# Patient Record
Sex: Male | Born: 1950 | Race: Black or African American | Hispanic: No
Health system: Southern US, Academic
[De-identification: ages and names within clinical notes are randomized; demographics above are authoritative.]

## PROBLEM LIST (undated history)

## (undated) ENCOUNTER — Ambulatory Visit

## (undated) ENCOUNTER — Encounter: Attending: Medical Oncology | Primary: Medical Oncology

## (undated) ENCOUNTER — Ambulatory Visit: Payer: Medicare (Managed Care) | Attending: Medical | Primary: Medical

## (undated) ENCOUNTER — Telehealth

## (undated) ENCOUNTER — Encounter

## (undated) ENCOUNTER — Telehealth: Attending: Medical Oncology | Primary: Medical Oncology

## (undated) ENCOUNTER — Encounter: Attending: Nurse Practitioner | Primary: Nurse Practitioner

## (undated) ENCOUNTER — Encounter
Attending: Pharmacist Clinician (PhC)/ Clinical Pharmacy Specialist | Primary: Pharmacist Clinician (PhC)/ Clinical Pharmacy Specialist

## (undated) ENCOUNTER — Ambulatory Visit: Payer: MEDICARE | Attending: Medical Oncology | Primary: Medical Oncology

## (undated) ENCOUNTER — Ambulatory Visit: Payer: MEDICARE

## (undated) ENCOUNTER — Ambulatory Visit
Payer: MEDICARE | Attending: Rehabilitative and Restorative Service Providers" | Primary: Rehabilitative and Restorative Service Providers"

## (undated) ENCOUNTER — Ambulatory Visit: Payer: Medicare (Managed Care)

## (undated) ENCOUNTER — Telehealth
Attending: Pharmacist Clinician (PhC)/ Clinical Pharmacy Specialist | Primary: Pharmacist Clinician (PhC)/ Clinical Pharmacy Specialist

## (undated) ENCOUNTER — Encounter: Payer: MEDICARE | Attending: Medical Oncology | Primary: Medical Oncology

## (undated) ENCOUNTER — Encounter
Payer: MEDICARE | Attending: Rehabilitative and Restorative Service Providers" | Primary: Rehabilitative and Restorative Service Providers"

## (undated) ENCOUNTER — Ambulatory Visit: Attending: Internal Medicine | Primary: Internal Medicine

## (undated) ENCOUNTER — Encounter: Payer: MEDICARE | Attending: Urology | Primary: Urology

## (undated) ENCOUNTER — Ambulatory Visit: Attending: Anesthesiology | Primary: Anesthesiology

## (undated) ENCOUNTER — Encounter: Payer: MEDICARE | Attending: Nurse Practitioner | Primary: Nurse Practitioner

## (undated) ENCOUNTER — Encounter: Attending: Internal Medicine | Primary: Internal Medicine

## (undated) ENCOUNTER — Telehealth: Attending: Urology | Primary: Urology

## (undated) ENCOUNTER — Encounter: Attending: Urology | Primary: Urology

## (undated) ENCOUNTER — Encounter
Payer: MEDICARE | Attending: Pharmacist Clinician (PhC)/ Clinical Pharmacy Specialist | Primary: Pharmacist Clinician (PhC)/ Clinical Pharmacy Specialist

## (undated) ENCOUNTER — Other Ambulatory Visit

## (undated) ENCOUNTER — Ambulatory Visit: Payer: MEDICARE | Attending: Vascular Surgery | Primary: Vascular Surgery

## (undated) ENCOUNTER — Encounter: Attending: Naturopath | Primary: Naturopath

## (undated) ENCOUNTER — Ambulatory Visit: Attending: Pharmacist | Primary: Pharmacist

## (undated) ENCOUNTER — Encounter: Payer: MEDICARE | Attending: Geriatric Medicine | Primary: Geriatric Medicine

## (undated) ENCOUNTER — Ambulatory Visit: Payer: MEDICARE | Attending: Nurse Practitioner | Primary: Nurse Practitioner

## (undated) ENCOUNTER — Non-Acute Institutional Stay: Payer: MEDICARE | Attending: Geriatric Medicine | Primary: Geriatric Medicine

## (undated) DIAGNOSIS — F1011 Alcohol abuse, in remission: Secondary | ICD-10-CM

## (undated) DIAGNOSIS — M549 Dorsalgia, unspecified: Secondary | ICD-10-CM

## (undated) DIAGNOSIS — G8929 Other chronic pain: Secondary | ICD-10-CM

## (undated) DIAGNOSIS — C801 Malignant (primary) neoplasm, unspecified: Secondary | ICD-10-CM

## (undated) DIAGNOSIS — M25579 Pain in unspecified ankle and joints of unspecified foot: Secondary | ICD-10-CM

## (undated) DIAGNOSIS — M25512 Pain in left shoulder: Secondary | ICD-10-CM

## (undated) DIAGNOSIS — I10 Essential (primary) hypertension: Secondary | ICD-10-CM

## (undated) DIAGNOSIS — M1990 Unspecified osteoarthritis, unspecified site: Secondary | ICD-10-CM

## (undated) DIAGNOSIS — E7800 Pure hypercholesterolemia, unspecified: Secondary | ICD-10-CM

## (undated) DIAGNOSIS — M5416 Radiculopathy, lumbar region: Secondary | ICD-10-CM

## (undated) DIAGNOSIS — R52 Pain, unspecified: Secondary | ICD-10-CM

## (undated) DIAGNOSIS — K219 Gastro-esophageal reflux disease without esophagitis: Secondary | ICD-10-CM

## (undated) DIAGNOSIS — J189 Pneumonia, unspecified organism: Secondary | ICD-10-CM

## (undated) HISTORY — DX: Malignant (primary) neoplasm, unspecified: C80.1

## (undated) HISTORY — DX: Gastro-esophageal reflux disease without esophagitis: K21.9

## (undated) HISTORY — PX: TONSILLECTOMY: SUR1361

## (undated) HISTORY — PX: BACK SURGERY: SHX140

## (undated) MED ORDER — XTANDI 40 MG CAPSULE: 160 mg | capsule | Freq: Every day | 6 refills | 30 days

---
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
## 2006-07-31 ENCOUNTER — Emergency Department: Payer: Self-pay | Admitting: Emergency Medicine

## 2008-09-11 ENCOUNTER — Ambulatory Visit (HOSPITAL_COMMUNITY): Admission: RE | Admit: 2008-09-11 | Discharge: 2008-09-11 | Payer: Self-pay | Admitting: Family Medicine

## 2009-11-20 ENCOUNTER — Emergency Department (HOSPITAL_COMMUNITY): Admission: EM | Admit: 2009-11-20 | Discharge: 2009-11-20 | Payer: Self-pay | Admitting: Emergency Medicine

## 2010-05-23 ENCOUNTER — Emergency Department (HOSPITAL_COMMUNITY): Payer: Medicare Other

## 2010-05-23 ENCOUNTER — Emergency Department (HOSPITAL_COMMUNITY)
Admission: EM | Admit: 2010-05-23 | Discharge: 2010-05-23 | Disposition: A | Discharge status: Home / Self Care | Payer: Medicare Other | Attending: Emergency Medicine | Admitting: Emergency Medicine

## 2010-05-23 DIAGNOSIS — R059 Cough, unspecified: Secondary | ICD-10-CM | POA: Insufficient documentation

## 2010-05-23 DIAGNOSIS — I10 Essential (primary) hypertension: Secondary | ICD-10-CM | POA: Insufficient documentation

## 2010-05-23 DIAGNOSIS — R05 Cough: Secondary | ICD-10-CM | POA: Insufficient documentation

## 2010-05-23 DIAGNOSIS — Z79899 Other long term (current) drug therapy: Secondary | ICD-10-CM | POA: Insufficient documentation

## 2010-05-23 DIAGNOSIS — J40 Bronchitis, not specified as acute or chronic: Secondary | ICD-10-CM | POA: Insufficient documentation

## 2010-06-17 LAB — DIFFERENTIAL
Basophils Absolute: 0.1 10*3/uL (ref 0.0–0.1)
Eosinophils Absolute: 0.7 10*3/uL (ref 0.0–0.7)
Lymphs Abs: 1.9 10*3/uL (ref 0.7–4.0)
Neutrophils Relative %: 58 % (ref 43–77)

## 2010-06-17 LAB — COMPREHENSIVE METABOLIC PANEL
ALT: 28 U/L (ref 0–53)
CO2: 28 mEq/L (ref 19–32)
Calcium: 9.6 mg/dL (ref 8.4–10.5)
Creatinine, Ser: 1.1 mg/dL (ref 0.4–1.5)
GFR calc non Af Amer: >60 mL/min (ref >60)
Glucose, Bld: 98 mg/dL (ref 70–99)
Total Bilirubin: 0.4 mg/dL (ref 0.3–1.2)

## 2010-06-17 LAB — CBC
HCT: 41.4 % (ref 39.0–52.0)
Hemoglobin: 14.2 g/dL (ref 13.0–17.0)
MCH: 31.2 pg (ref 26.0–34.0)
MCHC: 34.3 g/dL (ref 30.0–36.0)

## 2010-10-22 ENCOUNTER — Emergency Department (HOSPITAL_COMMUNITY)
Admission: EM | Admit: 2010-10-22 | Discharge: 2010-10-22 | Disposition: A | Discharge status: Home / Self Care | Payer: Medicare Other | Attending: Emergency Medicine | Admitting: Emergency Medicine

## 2010-10-22 ENCOUNTER — Encounter: Payer: Self-pay | Admitting: *Deleted

## 2010-10-22 DIAGNOSIS — F17200 Nicotine dependence, unspecified, uncomplicated: Secondary | ICD-10-CM | POA: Insufficient documentation

## 2010-10-22 DIAGNOSIS — R21 Rash and other nonspecific skin eruption: Secondary | ICD-10-CM | POA: Insufficient documentation

## 2010-10-22 MED ORDER — HYDROXYZINE PAMOATE 25 MG PO CAPS: ORAL_CAPSULE | ORAL | Status: DC

## 2010-10-22 MED ORDER — FAMOTIDINE 20 MG PO TABS
20.0000 mg | ORAL_TABLET | Freq: Once | ORAL | Status: AC
  Administered 2010-10-22: 20 mg via ORAL
  Filled 2010-10-22: qty 1

## 2010-10-22 MED ORDER — HYDROXYZINE HCL 50 MG/ML IM SOLN
25.0000 mg | Freq: Once | INTRAMUSCULAR | Status: AC
  Administered 2010-10-22: 25 mg via INTRAMUSCULAR
  Filled 2010-10-22: qty 1

## 2010-10-22 MED ORDER — PREDNISONE 10 MG PO TABS: ORAL_TABLET | ORAL | Status: DC

## 2010-10-22 MED ORDER — METHYLPREDNISOLONE SODIUM SUCC 125 MG IJ SOLR
125.0000 mg | Freq: Once | INTRAMUSCULAR | Status: AC
  Administered 2010-10-22: 125 mg via INTRAVENOUS
  Filled 2010-10-22: qty 2

## 2010-10-22 NOTE — ED Notes (Signed)
MD at bedside. Patient complaining about wait time for EDPA

## 2010-10-22 NOTE — ED Provider Notes (Signed)
History     Chief Complaint  Patient presents with  . Rash   Patient is a 60 y.o. male presenting with rash. The history is provided by the patient.  Rash  This is a recurrent problem. The current episode started more than 1 week ago. The problem has been gradually worsening. The problem is associated with nothing. There has been no fever. The rash is present on the torso, back, trunk, left upper leg, right upper leg and right buttock. The patient is experiencing no pain. Associated symptoms include itching. Pertinent negatives include no weeping. He has tried nothing for the symptoms. The treatment provided no relief.    History reviewed. No pertinent past medical history.  History reviewed. No pertinent past surgical history.  No family history on file.  History  Substance Use Topics  . Smoking status: Current Everyday Smoker  . Smokeless tobacco: Not on file  . Alcohol Use: Yes     Occ      Review of Systems  Constitutional: Negative for activity change.       All ROS Neg except as noted in HPI  HENT: Negative for nosebleeds and neck pain.   Eyes: Negative for photophobia and discharge.  Respiratory: Negative for cough, shortness of breath and wheezing.   Cardiovascular: Negative for chest pain and palpitations.  Gastrointestinal: Negative for abdominal pain and blood in stool.  Genitourinary: Negative for dysuria, frequency and hematuria.  Musculoskeletal: Negative for back pain and arthralgias.  Skin: Positive for itching and rash.  Neurological: Negative for dizziness, seizures and speech difficulty.  Psychiatric/Behavioral: Negative for hallucinations and confusion.    Physical Exam  BP 149/89  Pulse 64  Temp(Src) 97.9 F (36.6 C) (Oral)  Resp 16  SpO2 100%  Physical Exam  Nursing note and vitals reviewed. Constitutional: He is oriented to person, place, and time. He appears well-developed and well-nourished.  Non-toxic appearance.  HENT:  Head:  Normocephalic.  Right Ear: Tympanic membrane and external ear normal.  Left Ear: Tympanic membrane and external ear normal.  Eyes: EOM and lids are normal. Pupils are equal, round, and reactive to light.  Neck: Normal range of motion. Neck supple. Carotid bruit is not present.  Cardiovascular: Normal rate, regular rhythm, normal heart sounds, intact distal pulses and normal pulses.   Pulmonary/Chest: Breath sounds normal. No respiratory distress.  Abdominal: Soft. Bowel sounds are normal. There is no tenderness. There is no guarding.  Musculoskeletal: Normal range of motion.  Lymphadenopathy:       Head (right side): No submandibular adenopathy present.       Head (left side): No submandibular adenopathy present.    He has no cervical adenopathy.  Neurological: He is alert and oriented to person, place, and time. He has normal strength. No cranial nerve deficit or sensory deficit.  Skin: Skin is warm and dry. Rash noted.  Psychiatric: He has a normal mood and affect. His speech is normal.    ED Course  Procedures  MDM I have reviewed nursing notes, vital signs, and all appropriate lab and imaging results for this patient.      Kathie Dike, [place] 10/22/10 [phone]

## 2010-10-22 NOTE — ED Notes (Signed)
Pt states rash x 2 weeks. NAD.

## 2010-10-23 NOTE — ED Provider Notes (Signed)
Medical screening examination/treatment/procedure(s) were performed by non-physician practitioner and as supervising physician I was immediately available for consultation/collaboration.   Lyanne Co, MD 10/23/10 [phone]

## 2014-02-27 ENCOUNTER — Encounter (HOSPITAL_COMMUNITY): Payer: Self-pay | Admitting: Emergency Medicine

## 2014-02-27 ENCOUNTER — Emergency Department (HOSPITAL_COMMUNITY)
Admission: EM | Admit: 2014-02-27 | Discharge: 2014-02-27 | Disposition: A | Discharge status: Home / Self Care | Payer: Medicare Other | Attending: Emergency Medicine | Admitting: Emergency Medicine

## 2014-02-27 DIAGNOSIS — Z79899 Other long term (current) drug therapy: Secondary | ICD-10-CM | POA: Insufficient documentation

## 2014-02-27 DIAGNOSIS — Z720 Tobacco use: Secondary | ICD-10-CM | POA: Diagnosis not present

## 2014-02-27 DIAGNOSIS — Z7952 Long term (current) use of systemic steroids: Secondary | ICD-10-CM | POA: Insufficient documentation

## 2014-02-27 DIAGNOSIS — G8929 Other chronic pain: Secondary | ICD-10-CM | POA: Diagnosis not present

## 2014-02-27 DIAGNOSIS — M545 Low back pain, unspecified: Secondary | ICD-10-CM

## 2014-02-27 MED ORDER — OXYCODONE HCL 5 MG PO TABS: 5.0000 mg | ORAL_TABLET | ORAL | Status: DC | PRN

## 2014-02-27 NOTE — ED Notes (Signed)
Pt reports chronic back pain. Pt denies any new or recent injury.

## 2014-02-27 NOTE — ED Provider Notes (Signed)
CSN: [id number]     Arrival date & time 02/27/14  1142 History   This chart was scribed for [facility], NP with Babette Relic, MD by Edison Simon, ED Scribe. This patient was seen in room APFT21/APFT21 and the patient's care was started at 12:42 PM.    Chief Complaint  Patient presents with  . Back Pain   Patient is a 63 y.o. male presenting with back pain. The history is provided by the patient. No language interpreter was used.  Back Pain Location:  Lumbar spine Quality:  Unable to specify Radiates to:  L knee and R knee (buttocks) Pain severity:  Moderate Onset quality:  Gradual Duration: chronic, worse since last night. Timing:  Constant Progression:  Worsening Chronicity:  Chronic Context: not recent injury   Relieved by: BC. Ineffective treatments:  None tried Associated symptoms: no dysuria and no fever   Risk factors: hx of cancer     HPI Comments: Willie Fowler is a 63 y.o. male who presents to the Emergency Department complaining of chronic low back pain, worsening last night while he was sitting down. He is not able to verbalize the quality of his pain. He states that it radiates to bilateral buttocks and knees. He reports associated shoulder pain. He states that he has been treated for pain in the pastby a doctor at [facility]. He states the "lower bone in my back is gone," but denies injury or surgery. He is not aware of any initial injury that caused his pain. He states he has used BC with slight improvement. He states he has been prescribed Oxycodone in the past for back pain flare ups. He reports history of prostate cancer for the past 5 years; he states he received radiation therapy, but his cancer is recurring and states he has frequency and nocturia. He states he receives an injection in his hip every 3 months, but does not know it is. He states he next returns to [facility] on 12/29. He denies fever, chills, or dysuria.   History reviewed. No pertinent past medical  history. History reviewed. No pertinent past surgical history. History reviewed. No pertinent family history. History  Substance Use Topics  . Smoking status: Current Every Day Smoker -- 0.50 packs/day  . Smokeless tobacco: Not on file  . Alcohol Use: Yes     Comment: Occ    Review of Systems  Constitutional: Negative for fever and chills.  Genitourinary: Positive for frequency. Negative for dysuria.       Nocturia  Musculoskeletal: Positive for back pain and arthralgias.  all other systems negative  Allergies  Review of patient's allergies indicates no known allergies.  Home Medications   Prior to Admission medications   Medication Sig Start Date End Date Taking? Authorizing Provider  hydrOXYzine (VISTARIL) 25 MG capsule 1 po q6h prn itching 10/22/10   Lenox Ahr, PA-C  oxyCODONE (ROXICODONE) 5 MG immediate release tablet Take 1 tablet (5 mg total) by mouth every 4 (four) hours as needed for severe pain. 02/27/14   Hope Bunnie Pion, NP  predniSONE (DELTASONE) 10 MG tablet ,5,4,3,2,1 - take with food 10/22/10   Lenox Ahr, PA-C   BP 128/73 mmHg  Pulse 83  Temp(Src) 99.3 F (37.4 C) (Oral)  Resp 18  Ht 5\' 7"  (1.702 m)  Wt 193 lb (87.544 kg)  BMI 30.22 kg/m2  SpO2 97% Physical Exam  Constitutional: He is oriented to person, place, and time. He appears well-developed and well-nourished. No  distress.  HENT:  Head: Normocephalic and atraumatic.  Eyes: EOM are normal. Pupils are equal, round, and reactive to light.  Neck: Normal range of motion. Neck supple.  Cardiovascular: Normal rate and regular rhythm.   Pulmonary/Chest: Effort normal and breath sounds normal.  Abdominal: Soft. Bowel sounds are normal. There is no tenderness.  Musculoskeletal: Normal range of motion. He exhibits no edema.       Lumbar back: He exhibits tenderness. He exhibits normal range of motion, no deformity, no spasm and normal pulse.       Back:  Neurological: He is alert and oriented to  person, place, and time. He has normal strength. No cranial nerve deficit or sensory deficit. Coordination and gait normal.  Reflex Scores:      Bicep reflexes are 2+ on the right side and 2+ on the left side.      Brachioradialis reflexes are 2+ on the right side and 2+ on the left side.      Patellar reflexes are 2+ on the right side and 2+ on the left side.      Achilles reflexes are 2+ on the right side and 2+ on the left side. Skin: Skin is warm and dry.  Psychiatric: He has a normal mood and affect. His behavior is normal.  Nursing note and vitals reviewed.   ED Course  Procedures (including critical care time)  DIAGNOSTIC STUDIES: Oxygen Saturation is 97% on room air, normal by my interpretation.    COORDINATION OF CARE: 12:50 PM Discussed treatment plan with patient at beside, the patient agrees with the plan and has no further questions at this time.   MDM  63 y.o. male with low back pain and hx of chronic low back pain. Will treat for pain and he will follow up with his doctor as scheduled in [facility] or return here as needed. Stable for discharge with steady gait. Discussed with the patient and all questioned fully answered.    Medication List    TAKE these medications        oxyCODONE 5 MG immediate release tablet  Commonly known as:  ROXICODONE  Take 1 tablet (5 mg total) by mouth every 4 (four) hours as needed for severe pain.      ASK your doctor about these medications        hydrOXYzine 25 MG capsule  Commonly known as:  VISTARIL  1 po q6h prn itching     predniSONE 10 MG tablet  Commonly known as:  DELTASONE  ,5,4,3,2,1 - take with food         Final diagnoses:  Chronic low back pain  I personally performed the services described in this documentation, which was scribed in my presence. The recorded information has been reviewed and is accurate.   [place], NP 02/27/14 1459  Babette Relic, MD 02/27/14 2322

## 2014-04-03 HISTORY — PX: COLONOSCOPY: SHX174

## 2014-12-24 ENCOUNTER — Encounter (HOSPITAL_COMMUNITY): Payer: Self-pay

## 2014-12-24 ENCOUNTER — Emergency Department (HOSPITAL_COMMUNITY)
Admission: EM | Admit: 2014-12-24 | Discharge: 2014-12-24 | Disposition: A | Discharge status: Home / Self Care | Payer: Medicare Other | Attending: Emergency Medicine | Admitting: Emergency Medicine

## 2014-12-24 DIAGNOSIS — M2662 Arthralgia of temporomandibular joint: Secondary | ICD-10-CM | POA: Insufficient documentation

## 2014-12-24 DIAGNOSIS — M26629 Arthralgia of temporomandibular joint, unspecified side: Secondary | ICD-10-CM

## 2014-12-24 DIAGNOSIS — Z720 Tobacco use: Secondary | ICD-10-CM | POA: Insufficient documentation

## 2014-12-24 DIAGNOSIS — H9201 Otalgia, right ear: Secondary | ICD-10-CM | POA: Diagnosis present

## 2014-12-24 MED ORDER — NAPROXEN 500 MG PO TABS: 500.0000 mg | ORAL_TABLET | Freq: Two times a day (BID) | ORAL | Status: DC

## 2014-12-24 MED ORDER — NAPROXEN 250 MG PO TABS
500.0000 mg | ORAL_TABLET | Freq: Once | ORAL | Status: AC
  Administered 2014-12-24: 500 mg via ORAL
  Filled 2014-12-24: qty 2

## 2014-12-24 NOTE — Discharge Instructions (Signed)
Temporomandibular Problems  Temporomandibular joint (TMJ) dysfunction means there are problems with the joint between your jaw and your skull. This is a joint lined by cartilage like other joints in your body but also has a small disc in the joint which keeps the bones from rubbing on each other. These joints are like other joints and can get inflamed (sore) from arthritis and other problems. When this joint gets sore, it can cause headaches and pain in the jaw and the face. CAUSES  Usually the arthritic types of problems are caused by soreness in the joint. Soreness in the joint can also be caused by overuse. This may come from grinding your teeth. It may also come from mis-alignment in the joint. DIAGNOSIS Diagnosis of this condition can often be made by history and exam. Sometimes your caregiver may need X-rays or an MRI scan to determine the exact cause. It may be necessary to see your dentist to determine if your teeth and jaws are lined up correctly. TREATMENT  Most of the time this problem is not serious; however, sometimes it can persist (become chronic). When this happens medications that will cut down on inflammation (soreness) help. Sometimes a shot of cortisone into the joint will be helpful. If your teeth are not aligned it may help for your dentist to make a splint for your mouth that can help this problem. If no physical problems can be found, the problem may come from tension. If tension is found to be the cause, biofeedback or relaxation techniques may be helpful. HOME CARE INSTRUCTIONS   Later in the day, applications of ice packs may be helpful. Ice can be used in a plastic bag with a towel around it to prevent frostbite to skin. This may be used about every 2 hours for 20 to 30 minutes, as needed while awake, or as directed by your caregiver.  Only take over-the-counter or prescription medicines for pain, discomfort, or fever as directed by your caregiver.  If physical therapy was  prescribed, follow your caregiver's directions.  Wear mouth appliances as directed if they were given. Document Released: 12/13/2000 Document Revised: 06/12/2011 Document Reviewed: 03/22/2008 Exit[employer] Patient Information 2015 [employer]. This information is not intended to replace advice given to you by your health care provider. Make sure you discuss any questions you have with your health care provider.  

## 2014-12-24 NOTE — ED Notes (Signed)
My right ear has been hurting since last Thursday per pt.

## 2014-12-26 NOTE — ED Provider Notes (Signed)
CSN: [id number]     Arrival date & time 12/24/14  1950 History   First MD Initiated Contact with Patient 12/24/14 2012     Chief Complaint  Patient presents with  . Otalgia     (Consider location/radiation/quality/duration/timing/severity/associated sxs/prior Treatment) The history is provided by the patient.   Willie Fowler is a 64 y.o. male with no significant past medical history presenting with right ear pain which has been present for the past week.  He describes aching pain which is constant, but worsened with chewing.  He denies fevers, chills, drainage from his ear, decreased hearing acuity and also denies any dental or mouth problems.  He has taken no medicines for his pain.       History reviewed. No pertinent past medical history. History reviewed. No pertinent past surgical history. No family history on file. Social History  Substance Use Topics  . Smoking status: Current Every Day Smoker -- 0.50 packs/day  . Smokeless tobacco: None  . Alcohol Use: Yes     Comment: Occ    Review of Systems  Constitutional: Negative for fever.  HENT: Positive for ear pain. Negative for congestion, ear discharge, facial swelling, hearing loss, mouth sores and sore throat.   Eyes: Negative.  Negative for pain.  Respiratory: Negative for shortness of breath.   Cardiovascular: Negative for chest pain.  Gastrointestinal: Negative for nausea and vomiting.  Genitourinary: Negative.   Musculoskeletal: Negative for joint swelling and arthralgias.  Skin: Negative.  Negative for rash.  Neurological: Negative for dizziness, weakness, light-headedness, numbness and headaches.  Psychiatric/Behavioral: Negative.       Allergies  Review of patient's allergies indicates no known allergies.  Home Medications   Prior to Admission medications   Medication Sig Start Date End Date Taking? Authorizing Provider  hydrOXYzine (VISTARIL) 25 MG capsule 1 po q6h prn itching 10/22/10   Lily Kocher,  PA-C  naproxen (NAPROSYN) 500 MG tablet Take 1 tablet (500 mg total) by mouth 2 (two) times daily. 12/24/14   Evalee Jefferson, PA-C  oxyCODONE (ROXICODONE) 5 MG immediate release tablet Take 1 tablet (5 mg total) by mouth every 4 (four) hours as needed for severe pain. 02/27/14   [place], NP  predniSONE (DELTASONE) 10 MG tablet ,5,4,3,2,1 - take with food 10/22/10   Lily Kocher, PA-C   BP 117/84 mmHg  Pulse 83  Temp(Src) 98.2 F (36.8 C) (Oral)  Resp 16  Ht 5\' 6"  (1.676 m)  Wt 203 lb (92.08 kg)  BMI 32.78 kg/m2  SpO2 97% Physical Exam  Constitutional: He is oriented to person, place, and time. He appears well-developed and well-nourished.  HENT:  Head: Normocephalic and atraumatic.  Right Ear: Tympanic membrane and ear canal normal. No swelling or tenderness. No mastoid tenderness.  Left Ear: Tympanic membrane and ear canal normal. No swelling or tenderness. No mastoid tenderness.  Nose: No mucosal edema or rhinorrhea.  Mouth/Throat: Uvula is midline, oropharynx is clear and moist and mucous membranes are normal. No trismus in the jaw. Normal dentition. No dental abscesses. No oropharyngeal exudate, posterior oropharyngeal edema, posterior oropharyngeal erythema or tonsillar abscesses.  Pt is ttp at the right TM joint, worsened pain with mandible extension. No edema, no erythema. No dental infections noted.  Eyes: Conjunctivae are normal.  Cardiovascular: Normal rate and normal heart sounds.   Pulmonary/Chest: Effort normal. No respiratory distress. He has no wheezes. He has no rales.  Abdominal: Soft. There is no tenderness.  Musculoskeletal: Normal range of motion.  Neurological: He is alert and oriented to person, place, and time.  Skin: Skin is warm and dry. No rash noted.  Psychiatric: He has a normal mood and affect.    ED Course  Procedures (including critical care time) Labs Review Labs Reviewed - No data to display  Imaging Review No results found. I have personally  reviewed and evaluated these images and lab results as part of my medical decision-making.   EKG Interpretation None      MDM   Final diagnoses:  Temporomandibular joint (TMJ) pain    Pt with right TM joint pain, reproducible on exam.  Ears normal appearance.  He was prescribed naproxen. Advised to maintain soft food diet, may add ice tx if needed for acute pain.  F/u with pcp for a recheck if not improving over the next week.  Advised pt he may also benefit by seeing a dentist if sx persist.    Evalee Jefferson, PA-C 12/26/14 2139  Fredia Sorrow, MD 12/31/14 [phone]

## 2015-03-21 ENCOUNTER — Emergency Department (HOSPITAL_COMMUNITY)
Admission: EM | Admit: 2015-03-21 | Discharge: 2015-03-21 | Disposition: A | Discharge status: Home / Self Care | Payer: Medicare Other | Attending: Emergency Medicine | Admitting: Emergency Medicine

## 2015-03-21 ENCOUNTER — Emergency Department (HOSPITAL_COMMUNITY): Payer: Medicare Other

## 2015-03-21 ENCOUNTER — Encounter (HOSPITAL_COMMUNITY): Payer: Self-pay | Admitting: Emergency Medicine

## 2015-03-21 DIAGNOSIS — F17210 Nicotine dependence, cigarettes, uncomplicated: Secondary | ICD-10-CM | POA: Diagnosis not present

## 2015-03-21 DIAGNOSIS — J209 Acute bronchitis, unspecified: Secondary | ICD-10-CM | POA: Insufficient documentation

## 2015-03-21 DIAGNOSIS — R05 Cough: Secondary | ICD-10-CM | POA: Diagnosis present

## 2015-03-21 DIAGNOSIS — I10 Essential (primary) hypertension: Secondary | ICD-10-CM | POA: Insufficient documentation

## 2015-03-21 DIAGNOSIS — Z791 Long term (current) use of non-steroidal anti-inflammatories (NSAID): Secondary | ICD-10-CM | POA: Diagnosis not present

## 2015-03-21 DIAGNOSIS — Z8639 Personal history of other endocrine, nutritional and metabolic disease: Secondary | ICD-10-CM | POA: Insufficient documentation

## 2015-03-21 HISTORY — DX: Essential (primary) hypertension: I10

## 2015-03-21 HISTORY — DX: Pure hypercholesterolemia, unspecified: E78.00

## 2015-03-21 MED ORDER — PREDNISONE 50 MG PO TABS
60.0000 mg | ORAL_TABLET | Freq: Once | ORAL | Status: AC
  Administered 2015-03-21: 60 mg via ORAL
  Filled 2015-03-21: qty 1

## 2015-03-21 MED ORDER — IPRATROPIUM-ALBUTEROL 0.5-2.5 (3) MG/3ML IN SOLN
3.0000 mL | Freq: Once | RESPIRATORY_TRACT | Status: AC
  Administered 2015-03-21: 3 mL via RESPIRATORY_TRACT
  Filled 2015-03-21: qty 3

## 2015-03-21 MED ORDER — AZITHROMYCIN 250 MG PO TABS: 250.0000 mg | ORAL_TABLET | Freq: Every day | ORAL | Status: DC

## 2015-03-21 MED ORDER — ALBUTEROL SULFATE (2.5 MG/3ML) 0.083% IN NEBU
2.5000 mg | INHALATION_SOLUTION | Freq: Once | RESPIRATORY_TRACT | Status: AC
  Administered 2015-03-21: 2.5 mg via RESPIRATORY_TRACT
  Filled 2015-03-21: qty 3

## 2015-03-21 MED ORDER — ALBUTEROL SULFATE (2.5 MG/3ML) 0.083% IN NEBU
5.0000 mg | INHALATION_SOLUTION | Freq: Once | RESPIRATORY_TRACT | Status: AC
  Administered 2015-03-21: 5 mg via RESPIRATORY_TRACT
  Filled 2015-03-21: qty 6

## 2015-03-21 MED ORDER — PREDNISONE 20 MG PO TABS: 40.0000 mg | ORAL_TABLET | Freq: Every day | ORAL | Status: DC

## 2015-03-21 NOTE — ED Notes (Addendum)
Patient c/o congested cough with wheezing and shortness of breath x2 weeks. Denies any fevers. Cough nonproductive. Audible wheezing noted.

## 2015-03-21 NOTE — ED Notes (Signed)
Pt tolerated ambulation well. Maintained 96 o2

## 2015-03-21 NOTE — ED Provider Notes (Signed)
CSN: [id number]     Arrival date & time 03/21/15  1841 History  By signing my name below, I, Soijett Blue, attest that this documentation has been prepared under the direction and in the presence of Kem Parkinson, PA-C Electronically Signed: Soijett Blue, ED Scribe. 03/21/2015. 7:57 PM.   Chief Complaint  Patient presents with  . Cough      The history is provided by the patient. No language interpreter was used.    Willie Fowler is a 64 y.o. male with a PMHx of HTN and high cholesterol, who presents to the Emergency Department complaining of dry cough onset 4 days ago. He states that he is having associated symptoms of SOB, wheezing, nasal congestion, and chest tightness associated with excessive coughing. He states that he has tried delsum and inhaler with 1 puff q 4 hours with no relief for his symptoms. He denies fever, CP, vomiting, bloody sputum, and any other symptoms.  He notes that he was seen by his PCP within the last 4 days and was Rx an inhaler that has not alleviated his symptoms.    Past Medical History  Diagnosis Date  . Hypertension   . High cholesterol    History reviewed. No pertinent past surgical history. No family history on file. Social History  Substance Use Topics  . Smoking status: Current Every Day Smoker -- 0.50 packs/day for 40 years    Types: Cigarettes  . Smokeless tobacco: Never Used  . Alcohol Use: Yes     Comment: Occ    Review of Systems  Constitutional: Negative for fever and appetite change.  HENT: Positive for congestion.   Respiratory: Positive for cough, chest tightness, shortness of breath and wheezing.   Cardiovascular: Negative for chest pain.  All other systems reviewed and are negative.     Allergies  Review of patient's allergies indicates no known allergies.  Home Medications   Prior to Admission medications   Medication Sig Start Date End Date Taking? Authorizing Provider  hydrOXYzine (VISTARIL) 25 MG capsule 1 po q6h  prn itching 10/22/10   Lily Kocher, PA-C  naproxen (NAPROSYN) 500 MG tablet Take 1 tablet (500 mg total) by mouth 2 (two) times daily. 12/24/14   Evalee Jefferson, PA-C  oxyCODONE (ROXICODONE) 5 MG immediate release tablet Take 1 tablet (5 mg total) by mouth every 4 (four) hours as needed for severe pain. 02/27/14   Hope Bunnie Pion, NP  predniSONE (DELTASONE) 10 MG tablet ,5,4,3,2,1 - take with food 10/22/10   Lily Kocher, PA-C   BP 143/57 mmHg  Pulse 75  Temp(Src) 97.8 F (36.6 C) (Oral)  Resp 20  Ht 5\' 7"  (1.702 m)  Wt 204 lb (92.534 kg)  BMI 31.94 kg/m2  SpO2 96% Physical Exam  Constitutional: He is oriented to person, place, and time. He appears well-developed and well-nourished. No distress.  HENT:  Head: Normocephalic and atraumatic.  Right Ear: Tympanic membrane, external ear and ear canal normal.  Left Ear: Tympanic membrane, external ear and ear canal normal.  Mouth/Throat: Uvula is midline and oropharynx is clear and moist. Mucous membranes are dry.  Eyes: EOM are normal.  Neck: Neck supple.  Cardiovascular: Normal rate, regular rhythm and normal heart sounds.  Exam reveals no gallop and no friction rub.   No murmur heard. Pulmonary/Chest: Effort normal. No respiratory distress. He has wheezes. He has no rales. He exhibits no tenderness.  Audible inspiratory and expiratory wheezes. No rales.   Abdominal: Soft. There is no  tenderness.  Musculoskeletal: Normal range of motion. He exhibits no edema.  Neurological: He is alert and oriented to person, place, and time. He has normal reflexes.  Skin: Skin is warm and dry.  Psychiatric: He has a normal mood and affect. His behavior is normal.  Nursing note and vitals reviewed.   ED Course  Procedures (including critical care time) DIAGNOSTIC STUDIES: Oxygen Saturation is 96% on RA, nl by my interpretation.    COORDINATION OF CARE: 7:54 PM Discussed treatment plan with pt at bedside which includes breathing treatment, prednisone,  and pt agreed to plan.    Labs Review Labs Reviewed - No data to display  Imaging Review Dg Chest 2 View  03/21/2015  CLINICAL DATA:  Cough, congestion, wheezing and shortness of breath for 2 weeks. EXAM: CHEST  2 VIEW COMPARISON:  05/23/2010 radiographs. FINDINGS: Suboptimal inspiration on the lateral view with resulting mild bibasilar atelectasis. The lungs appear clear on the lateral view. There is mild central airway thickening. The heart size and mediastinal contours are stable. There is no pleural effusion or pneumothorax. The bones appear unchanged. IMPRESSION: Mild central airway thickening with superimposed atelectasis on the frontal exam attributed to suboptimal inspiration. No evidence of pneumonia. Electronically Signed   By: Richardean Sale M.D.   On: 03/21/2015 19:21   I have personally reviewed and evaluated these images as part of my medical decision-making.    MDM   Final diagnoses:  Bronchitis with bronchospasm    Pt ambulated in the dept with O2 sat of 96% on RA.  No respiratory distress.  Pt feeling better and talking on the phone during my reassessment.  Pt appears stable for d/c and will Rx zithromax and prednisone.  Has albuterol inhaler at home, given by his PMD for same earlier this week.    I personally performed the services described in this documentation, which was scribed in my presence. The recorded information has been reviewed and is accurate.    Kem Parkinson, PA-C 03/24/15 [place], DO 03/24/15 [id number]

## 2016-05-18 ENCOUNTER — Encounter (HOSPITAL_COMMUNITY): Payer: Self-pay | Admitting: Emergency Medicine

## 2016-05-18 ENCOUNTER — Emergency Department (HOSPITAL_COMMUNITY)
Admission: EM | Admit: 2016-05-18 | Discharge: 2016-05-18 | Disposition: A | Discharge status: Home / Self Care | Payer: Medicare Other | Attending: Emergency Medicine | Admitting: Emergency Medicine

## 2016-05-18 DIAGNOSIS — Z79899 Other long term (current) drug therapy: Secondary | ICD-10-CM | POA: Diagnosis not present

## 2016-05-18 DIAGNOSIS — F17210 Nicotine dependence, cigarettes, uncomplicated: Secondary | ICD-10-CM | POA: Insufficient documentation

## 2016-05-18 DIAGNOSIS — M545 Low back pain, unspecified: Secondary | ICD-10-CM

## 2016-05-18 DIAGNOSIS — G8929 Other chronic pain: Secondary | ICD-10-CM | POA: Diagnosis not present

## 2016-05-18 DIAGNOSIS — I10 Essential (primary) hypertension: Secondary | ICD-10-CM | POA: Insufficient documentation

## 2016-05-18 MED ORDER — CYCLOBENZAPRINE HCL 10 MG PO TABS: 10.0000 mg | ORAL_TABLET | Freq: Three times a day (TID) | ORAL | 0 refills | Status: DC | PRN

## 2016-05-18 MED ORDER — PREDNISONE 10 MG PO TABS: ORAL_TABLET | ORAL | 0 refills | Status: DC

## 2016-05-18 MED ORDER — KETOROLAC TROMETHAMINE 60 MG/2ML IM SOLN
60.0000 mg | Freq: Once | INTRAMUSCULAR | Status: AC
  Administered 2016-05-18: 60 mg via INTRAMUSCULAR
  Filled 2016-05-18: qty 2

## 2016-05-18 NOTE — ED Provider Notes (Signed)
[place] DEPT Provider Note   CSN: [id number] Arrival date & time: 05/18/16  [id number]     History   Chief Complaint Chief Complaint  Patient presents with  . Back Pain    HPI Willie Fowler is a 66 y.o. male.  HPI   Willie Fowler is a 66 y.o. male with hx of chronic low back pain, who presents to the Emergency Department complaining of low back pain.  He complains of chronic pain to the lower back and followed by orthopedics at [facility].  He states that he has been treated with medications that are not controlling his pain.  He also complains of chronic pain to the left ankle after having surgery of his lower back last year.  He denies leg pain or numbness,  urine or bowel changes, abd pain, fever, chills, or recent changes in his level of pain.       Past Medical History:  Diagnosis Date  . High cholesterol   . Hypertension     There are no active problems to display for this patient.   No past surgical history on file.     Home Medications    Prior to Admission medications   Medication Sig Start Date End Date Taking? Authorizing Provider  azithromycin (ZITHROMAX) 250 MG tablet Take 1 tablet (250 mg total) by mouth daily. Take first 2 tablets together, then 1 every day until finished. 03/21/15   Xitlalic Maslin, PA-C  ezetimibe (ZETIA) 10 MG tablet Take 10 mg by mouth daily. 08/18/11   Historical Provider, MD  hydrOXYzine (VISTARIL) 25 MG capsule 1 po q6h prn itching Patient taking differently: Take 25 mg by mouth. 1 po q6h prn itching 10/22/10   Lily Kocher, PA-C  lidocaine (XYLOCAINE) 5 % ointment Apply 1 application topically daily. 02/28/15   Historical Provider, MD  lisinopril (PRINIVIL,ZESTRIL) 40 MG tablet Take 40 mg by mouth daily. 12/25/14   Historical Provider, MD  naproxen (NAPROSYN) 500 MG tablet Take 1 tablet (500 mg total) by mouth 2 (two) times daily. 12/24/14   Evalee Jefferson, PA-C  omeprazole (PRILOSEC) 20 MG capsule Take 20 mg by mouth daily.    Historical  Provider, MD  oxyCODONE (ROXICODONE) 5 MG immediate release tablet Take 1 tablet (5 mg total) by mouth every 4 (four) hours as needed for severe pain. 02/27/14   Hope Bunnie Pion, NP  Oxycodone HCl 10 MG TABS Take 10 mg by mouth every 6 (six) hours as needed (Pain).    Historical Provider, MD  predniSONE (DELTASONE) 20 MG tablet Take 2 tablets (40 mg total) by mouth daily. For 5 days 03/21/15   Jakarie Pember, PA-C  tamsulosin (FLOMAX) 0.4 MG CAPS capsule Take 0.4 mg by mouth daily. 04/19/12   Historical Provider, MD  VENTOLIN HFA 108 (90 BASE) MCG/ACT inhaler Inhale 2 puffs into the lungs every 4 (four) hours as needed. 03/16/15   Historical Provider, MD  verapamil (CALAN-SR) 240 MG CR tablet Take 240 mg by mouth daily. 11/19/08   Historical Provider, MD    Family History No family history on file.  Social History Social History  Substance Use Topics  . Smoking status: Current Every Day Smoker    Packs/day: 0.50    Years: 40.00    Types: Cigarettes  . Smokeless tobacco: Never Used  . Alcohol use Yes     Comment: Occ     Allergies   Patient has no known allergies.   Review of Systems Review of Systems  Constitutional: Negative for fever.  Respiratory: Negative for shortness of breath.   Gastrointestinal: Negative for abdominal pain, constipation and vomiting.  Genitourinary: Negative for decreased urine volume, difficulty urinating, dysuria, flank pain and hematuria.  Musculoskeletal: Positive for back pain. Negative for joint swelling.  Skin: Negative for rash.  Neurological: Negative for weakness and numbness.  All other systems reviewed and are negative.    Physical Exam Updated Vital Signs BP 135/79 (BP Location: Left Arm)   Pulse 79   Temp 98.5 F (36.9 C) (Oral)   Resp 16   Ht 5\' 7"  (1.702 m)   Wt 89.4 kg   SpO2 99%   BMI 30.85 kg/m   Physical Exam  Constitutional: He is oriented to person, place, and time. He appears well-developed and well-nourished. No  distress.  HENT:  Head: Normocephalic and atraumatic.  Neck: Normal range of motion. Neck supple.  Cardiovascular: Normal rate, regular rhythm and intact distal pulses.   No murmur heard. Pulmonary/Chest: Effort normal and breath sounds normal. No respiratory distress.  Abdominal: Soft. He exhibits no distension. There is no tenderness.  Musculoskeletal: He exhibits tenderness. He exhibits no edema.       Lumbar back: He exhibits tenderness and pain. He exhibits normal range of motion, no swelling, no deformity, no laceration and normal pulse.  ttp of the bilateral lumbar paraspinal muscles and lower spine.  DP pulses are brisk and symmetrical.  Well healed surgical scar. Distal sensation intact.  Pt has 5/5 strength against resistance of bilateral lower extremities.     Neurological: He is alert and oriented to person, place, and time. He has normal strength. No sensory deficit. He exhibits normal muscle tone. Coordination and gait normal.  Reflex Scores:      Patellar reflexes are 2+ on the right side and 2+ on the left side.      Achilles reflexes are 2+ on the right side and 2+ on the left side. Skin: Skin is warm and dry. No rash noted.  Nursing note and vitals reviewed.    ED Treatments / Results  Labs (all labs ordered are listed, but only abnormal results are displayed) Labs Reviewed - No data to display  EKG  EKG Interpretation None       Radiology No results found.  Procedures Procedures (including critical care time)  Medications Ordered in ED Medications - No data to display   Initial Impression / Assessment and Plan / ED Course  I have reviewed the triage vital signs and the nursing notes.  Pertinent labs & imaging results that were available during my care of the patient were reviewed by me and considered in my medical decision making (see chart for details).     Pt is well appearing.  Vitals stable.  No focal neuro deficits, ambulates with a steady  gait.  Acute on chronic pain.    On further history in [place] pt was seen by his orthopedist at [facility] on the day prior to this visit.  On review of the note, pt was offered referral to pain management, but pt declined.  Noted that further narcotic prescriptions will not be prescribed.   It is believed that pt's back pain is likely chronic and I do not feel that further narcotics are indicated.  I have advised pt that he will need to f/u with his PCP for further management.  Pain addressed with muscle relaxer and NSAID  Final Clinical Impressions(s) / ED Diagnoses   Final diagnoses:  Chronic left-sided low back pain without sciatica    New Prescriptions New Prescriptions   No medications on file     Bufford Lope 05/21/16 [place], MD 05/22/16 [phone]

## 2016-05-18 NOTE — ED Triage Notes (Signed)
Chronic (>5 years) back pain, being seen by Doctors at [facility] but back is not getting any better, per pt.  Rates pain 10/10.

## 2016-05-18 NOTE — Discharge Instructions (Signed)
Follow up with your primary doctor °

## 2016-07-23 ENCOUNTER — Emergency Department (HOSPITAL_COMMUNITY)
Admission: EM | Admit: 2016-07-23 | Discharge: 2016-07-23 | Disposition: A | Discharge status: Home / Self Care | Payer: Medicare Other | Attending: Emergency Medicine | Admitting: Emergency Medicine

## 2016-07-23 ENCOUNTER — Encounter (HOSPITAL_COMMUNITY): Payer: Self-pay | Admitting: Emergency Medicine

## 2016-07-23 DIAGNOSIS — I10 Essential (primary) hypertension: Secondary | ICD-10-CM | POA: Diagnosis not present

## 2016-07-23 DIAGNOSIS — H9202 Otalgia, left ear: Secondary | ICD-10-CM

## 2016-07-23 DIAGNOSIS — M25512 Pain in left shoulder: Secondary | ICD-10-CM | POA: Insufficient documentation

## 2016-07-23 DIAGNOSIS — M542 Cervicalgia: Secondary | ICD-10-CM | POA: Diagnosis not present

## 2016-07-23 DIAGNOSIS — F17210 Nicotine dependence, cigarettes, uncomplicated: Secondary | ICD-10-CM | POA: Diagnosis not present

## 2016-07-23 DIAGNOSIS — Z79899 Other long term (current) drug therapy: Secondary | ICD-10-CM | POA: Diagnosis not present

## 2016-07-23 DIAGNOSIS — G8929 Other chronic pain: Secondary | ICD-10-CM | POA: Insufficient documentation

## 2016-07-23 HISTORY — DX: Pain, unspecified: R52

## 2016-07-23 HISTORY — DX: Other chronic pain: G89.29

## 2016-07-23 HISTORY — DX: Alcohol abuse, in remission: F10.11

## 2016-07-23 HISTORY — DX: Pain in unspecified ankle and joints of unspecified foot: M25.579

## 2016-07-23 HISTORY — DX: Pain in left shoulder: M25.512

## 2016-07-23 HISTORY — DX: Dorsalgia, unspecified: M54.9

## 2016-07-23 HISTORY — DX: Radiculopathy, lumbar region: M54.16

## 2016-07-23 MED ORDER — LIDOCAINE VISCOUS 2 % MT SOLN
15.0000 mL | Freq: Once | OROMUCOSAL | Status: AC
  Administered 2016-07-23: 15 mL via OROMUCOSAL
  Filled 2016-07-23: qty 15

## 2016-07-23 NOTE — ED Triage Notes (Addendum)
Patient c/o left ear pain x4 days. Denies any fevers or drainage from ear. Per patient worse when he moves his jaw. Patient also c/o left shoulder with no known injury. Patient states "I think its arthritis pain." Per patient pain worse with movement. Denies any chest pain or shortness of breath.

## 2016-07-23 NOTE — Discharge Instructions (Signed)
Your ear exam is normal today.  I suspect you may have some discomfort, possibly from your hearing aid use.  You may apply 1-2 drops of the medicine given in your left ear every 8 hours followed by lying with this ear toward the ceiling for a few minutes so it will absorb.  It appears that your chronic pain specialist at [facility] is arranging a shoulder injection for your chronic shoulder pain.  Follow up with their instructions as discussed.

## 2016-07-23 NOTE — ED Provider Notes (Signed)
[place] DEPT Provider Note   CSN: [id number] Arrival date & time: 07/23/16  1755  By signing my name below, I, Collene Leyden, attest that this documentation has been prepared under the direction and in the presence of Evalee Jefferson, PA-C. Electronically Signed: Collene Leyden, Scribe. 07/23/16. 6:50 PM.  History   Chief Complaint Chief Complaint  Patient presents with  . Otalgia   HPI Comments: Willie Fowler is a 66 y.o. male with a history of HTN and HLD. who presents to the Emergency Department complaining of sudden-onset, constant left ear pain that began 4 days ago. Patient states he has had similar problems in the past, perhaps 3 times a year which last a few days then resolves.  He wears hearing aids but does not currently have them in. Patient states his pain is worse by opening his mouth/chewing. Patient denies any fever, trouble swallowing, sore throat, dental problems or ear drainage.   Patient is also complaining of chronic left shoulder pain that began several weeks ago. Patient states his pain is similar to arthritis.Patient states he has been followed for his left shoulder arthritis in the past and saw his chronic pain specialist at [facility] this week who is arranging a steroid injection. No modifying factors indicated. Patient states his pain is worse with movement and upon waking up in the morning with stiffness. Steroid shots usually improve his pain. Patient denies any chest pain, shortness of breath, numbness, or weakness. Patient requesting steroid shots in the shoulder.   The history is provided by the patient. No language interpreter was used.    Past Medical History:  Diagnosis Date  . Chronic ankle pain   . Chronic back pain   . Chronic left shoulder pain   . High cholesterol   . History of alcohol abuse   . Hypertension   . Lumbar radiculopathy   . Pain management    [facility]    There are no active problems to display for this patient.   History reviewed. No  pertinent surgical history.     Home Medications    Prior to Admission medications   Medication Sig Start Date End Date Taking? Authorizing Provider  cyclobenzaprine (FLEXERIL) 10 MG tablet Take 1 tablet (10 mg total) by mouth 3 (three) times daily as needed. 05/18/16   Tammy Triplett, PA-C  ezetimibe (ZETIA) 10 MG tablet Take 10 mg by mouth daily. 08/18/11   Historical Provider, MD  lidocaine (XYLOCAINE) 5 % ointment Apply 1 application topically daily. 02/28/15   Historical Provider, MD  lisinopril (PRINIVIL,ZESTRIL) 40 MG tablet Take 40 mg by mouth daily. 12/25/14   Historical Provider, MD  LYRICA 150 MG capsule Take 1 capsule by mouth daily. 04/24/16   Historical Provider, MD  omeprazole (PRILOSEC) 20 MG capsule Take 20 mg by mouth daily.    Historical Provider, MD  Oxycodone HCl 10 MG TABS Take 10 mg by mouth every 6 (six) hours as needed (Pain).    Historical Provider, MD  predniSONE (DELTASONE) 10 MG tablet Take 6 tablets day one, 5 tablets day two, 4 tablets day three, 3 tablets day four, 2 tablets day five, then 1 tablet day six 05/18/16   Tammy Triplett, PA-C  tamsulosin (FLOMAX) 0.4 MG CAPS capsule Take 0.4 mg by mouth daily. 04/19/12   Historical Provider, MD  topiramate (TOPAMAX) 50 MG tablet Take 1 tablet by mouth 2 (two) times daily. 05/01/16   Historical Provider, MD  VENTOLIN HFA 108 (90 BASE) MCG/ACT inhaler Inhale 2  puffs into the lungs every 4 (four) hours as needed. 03/16/15   Historical Provider, MD  verapamil (CALAN-SR) 240 MG CR tablet Take 240 mg by mouth daily. 11/19/08   Historical Provider, MD    Family History History reviewed. No pertinent family history.  Social History Social History  Substance Use Topics  . Smoking status: Current Every Day Smoker    Packs/day: 0.50    Years: 40.00    Types: Cigarettes  . Smokeless tobacco: Never Used  . Alcohol use Yes     Comment: Occ     Allergies   Patient has no known allergies.   Review of Systems Review of  Systems  Constitutional: Negative for chills and fever.  HENT: Positive for ear pain (left). Negative for ear discharge, sore throat and trouble swallowing.        Jaw pain.   Respiratory: Negative for shortness of breath.   Cardiovascular: Negative for chest pain.  Musculoskeletal: Positive for arthralgias (left shoulder ) and neck pain. Negative for joint swelling.  Neurological: Negative for weakness and numbness.     Physical Exam Updated Vital Signs BP (!) 126/59 (BP Location: Right Arm)   Pulse 71   Temp 98.3 F (36.8 C) (Oral)   Resp 18   Ht 5\' 7"  (1.702 m)   Wt 92.5 kg   SpO2 98%   BMI 31.95 kg/m   Physical Exam  Constitutional: He is oriented to person, place, and time. He appears well-developed.  HENT:  Head: Normocephalic and atraumatic.  Right Ear: External ear normal.  Left Ear: External ear normal.  Mouth/Throat: Oropharynx is clear and moist.  No TM erythema or bulging. External canals are clear. No obvious source of pain along his dentition. Posterior oropharynx is normal without edema or erythema. No TMJ tenderness or crepitus with full range of motion.   Eyes: Conjunctivae and EOM are normal. Pupils are equal, round, and reactive to light.  Neck: Normal range of motion. Neck supple.  No carotid bruit noted.   Cardiovascular: Normal rate.   Pulmonary/Chest: Effort normal.  Musculoskeletal: Normal range of motion. He exhibits tenderness. He exhibits no edema or deformity.  TTP along his lateral humoral head. No palpable deformity, edema, or crepitus with range of motion. He has full range of motion, but painful with anterior flexion.   Neurological: He is alert and oriented to person, place, and time.  Skin: Skin is warm and dry.  Psychiatric: He has a normal mood and affect.  Nursing note and vitals reviewed.    ED Treatments / Results  DIAGNOSTIC STUDIES: Oxygen Saturation is 98% on RA, normal by my interpretation.    COORDINATION OF CARE: 6:49 PM  Discussed treatment plan with pt at bedside and pt agreed to plan.   Labs (all labs ordered are listed, but only abnormal results are displayed) Labs Reviewed - No data to display  EKG  EKG Interpretation  Date/Time:  Sunday July 23 2016 18:24:20 EDT Ventricular Rate:  60 PR Interval:  156 QRS Duration: 72 QT Interval:  424 QTC Calculation: 424 R Axis:   19 Text Interpretation:  Normal sinus rhythm Normal ECG No old tracing to compare Confirmed by [facility]  MD, Nunzio Cory [phone]) on 07/23/2016 6:38:14 PM       Radiology No results found.  Procedures Procedures (including critical care time)  Medications Ordered in ED Medications  lidocaine (XYLOCAINE) 2 % viscous mouth solution 15 mL (15 mLs Mouth/Throat Given 07/23/16 1910)  Initial Impression / Assessment and Plan / ED Course  I have reviewed the triage vital signs and the nursing notes.  Pertinent labs & imaging results that were available during my care of the patient were reviewed by me and considered in my medical decision making (see chart for details).     Pt with chronic left shoulder pain, reproducible on exam, and currently followed by pain management.  Left otitis, suspect possible SE of hearing aide use.  Pt denies cp, denies sob, no exertional sx, doubt atypical cardiac source. Pt given viscous lidocaine for use in the left ear, drop instilled here with improved pain.  Plan f/u with pcp prn. Pt requesting steroid shot for shoulder - advised pt his pain specialist is arranging this and will defer to them.  Final Clinical Impressions(s) / ED Diagnoses   Final diagnoses:  Left ear pain  Chronic left shoulder pain    New Prescriptions Discharge Medication List as of 07/23/2016  7:09 PM     I personally performed the services described in this documentation, which was scribed in my presence. The recorded information has been reviewed and is accurate.     Evalee Jefferson, PA-C 07/25/16 [place], DO 07/26/16 1517

## 2016-09-17 ENCOUNTER — Emergency Department (HOSPITAL_COMMUNITY): Payer: Medicare Other

## 2016-09-17 ENCOUNTER — Emergency Department (HOSPITAL_COMMUNITY)
Admission: EM | Admit: 2016-09-17 | Discharge: 2016-09-18 | Disposition: A | Discharge status: Home / Self Care | Payer: Medicare Other | Attending: Emergency Medicine | Admitting: Emergency Medicine

## 2016-09-17 ENCOUNTER — Encounter (HOSPITAL_COMMUNITY): Payer: Self-pay | Admitting: Adult Health

## 2016-09-17 DIAGNOSIS — R112 Nausea with vomiting, unspecified: Secondary | ICD-10-CM | POA: Insufficient documentation

## 2016-09-17 DIAGNOSIS — F17210 Nicotine dependence, cigarettes, uncomplicated: Secondary | ICD-10-CM | POA: Diagnosis not present

## 2016-09-17 DIAGNOSIS — I10 Essential (primary) hypertension: Secondary | ICD-10-CM | POA: Insufficient documentation

## 2016-09-17 DIAGNOSIS — R1013 Epigastric pain: Secondary | ICD-10-CM | POA: Insufficient documentation

## 2016-09-17 LAB — CBC
HCT: 41.1 % (ref 39.0–52.0)
Hemoglobin: 13.8 g/dL (ref 13.0–17.0)
MCH: 29.9 pg (ref 26.0–34.0)
MCHC: 33.6 g/dL (ref 30.0–36.0)
MCV: 89 fL (ref 78.0–100.0)
Platelets: 245 10*3/uL (ref 150–400)
RBC: 4.62 MIL/uL (ref 4.22–5.81)
RDW: 15.6 % — ABNORMAL HIGH (ref 11.5–15.5)
WBC: 11.4 10*3/uL — ABNORMAL HIGH (ref 4.0–10.5)

## 2016-09-17 LAB — COMPREHENSIVE METABOLIC PANEL
ALT: 21 U/L (ref 17–63)
AST: 25 U/L (ref 15–41)
Albumin: 4.1 g/dL (ref 3.5–5.0)
Alkaline Phosphatase: 98 U/L (ref 38–126)
Anion gap: 13 (ref 5–15)
BUN: 12 mg/dL (ref 6–20)
CO2: 22 mmol/L (ref 22–32)
Calcium: 9.2 mg/dL (ref 8.9–10.3)
Chloride: 108 mmol/L (ref 101–111)
Creatinine, Ser: 1.05 mg/dL (ref 0.61–1.24)
GFR calc Af Amer: >60 mL/min (ref >60)
GFR calc non Af Amer: >60 mL/min (ref >60)
Glucose, Bld: 151 mg/dL — ABNORMAL HIGH (ref 65–99)
Potassium: 3.8 mmol/L (ref 3.5–5.1)
Sodium: 143 mmol/L (ref 135–145)
Total Bilirubin: 0.3 mg/dL (ref 0.3–1.2)
Total Protein: 7.8 g/dL (ref 6.5–8.1)

## 2016-09-17 LAB — LIPASE, BLOOD: Lipase: 28 U/L (ref 11–51)

## 2016-09-17 MED ORDER — GI COCKTAIL ~~LOC~~
30.0000 mL | Freq: Once | ORAL | Status: AC
  Administered 2016-09-18: 30 mL via ORAL
  Filled 2016-09-17: qty 30

## 2016-09-17 MED ORDER — ONDANSETRON HCL 4 MG/2ML IJ SOLN
4.0000 mg | Freq: Once | INTRAMUSCULAR | Status: AC | PRN
  Administered 2016-09-17: 4 mg via INTRAVENOUS
  Filled 2016-09-17: qty 2

## 2016-09-17 MED ORDER — PROMETHAZINE HCL 25 MG/ML IJ SOLN
12.5000 mg | Freq: Once | INTRAMUSCULAR | Status: AC
  Administered 2016-09-17: 12.5 mg via INTRAVENOUS
  Filled 2016-09-17: qty 1

## 2016-09-17 MED ORDER — HYDROMORPHONE HCL 1 MG/ML IJ SOLN
1.0000 mg | Freq: Once | INTRAMUSCULAR | Status: AC
  Administered 2016-09-17: 1 mg via INTRAVENOUS
  Filled 2016-09-17: qty 1

## 2016-09-17 MED ORDER — SODIUM CHLORIDE 0.9 % IV BOLUS (SEPSIS)
1000.0000 mL | Freq: Once | INTRAVENOUS | Status: AC
  Administered 2016-09-17: 1000 mL via INTRAVENOUS

## 2016-09-17 NOTE — ED Triage Notes (Signed)
Presents with severe upper abdominal pain, nausea and vomiting htat began about 9 pm after eating [employer]. PT had one episode of emeisis and is now c/o severe abdominal pain. He says talking is making it worse. But continues to say "It is hurting so bad" hyperactive BS.

## 2016-09-17 NOTE — ED Provider Notes (Signed)
[place] DEPT Provider Note   CSN: [id number] Arrival date & time: 09/17/16  2154  By signing my name below, I, Reola Mosher, attest that this documentation has been prepared under the direction and in the presence of Virgel Manifold, MD. Electronically Signed: Reola Mosher, ED Scribe. 09/17/16. 10:53 PM.  History   Chief Complaint Chief Complaint  Patient presents with  . Abdominal Pain   The history is provided by the patient. No language interpreter was used.    HPI Comments: Willie Fowler is a 66 y.o. male who presents to the Emergency Department complaining of  abdominal pain beginning approximately two hours ago. Per pt, he was eating [employer] tonight when he had an acute onset of epigastric abdominal pain. He notes associated nausea and vomiting. He was otherwise at his baseline and asymptomatic throughout the day prior to the onset of his pain. His pain is worse upon palpation and movement. No noted treatments for his symptoms were tried prior to coming into the ED. No h/o similar pain. No PSHx to the abdomen. He is an occasional drinker. He denies fever, chills, abdominal distension, or any other associated symptoms.   Past Medical History:  Diagnosis Date  . Chronic ankle pain   . Chronic back pain   . Chronic left shoulder pain   . High cholesterol   . History of alcohol abuse   . Hypertension   . Lumbar radiculopathy   . Pain management    [facility]   There are no active problems to display for this patient.  History reviewed. No pertinent surgical history.  Home Medications    Prior to Admission medications   Medication Sig Start Date End Date Taking? Authorizing Provider  cyclobenzaprine (FLEXERIL) 10 MG tablet Take 1 tablet (10 mg total) by mouth 3 (three) times daily as needed. 05/18/16   Triplett, Tammy, PA-C  ezetimibe (ZETIA) 10 MG tablet Take 10 mg by mouth daily. 08/18/11   [provider]  lidocaine (XYLOCAINE) 5 % ointment Apply  1 application topically daily. 02/28/15   [provider]  lisinopril (PRINIVIL,ZESTRIL) 40 MG tablet Take 40 mg by mouth daily. 12/25/14   [provider]  LYRICA 150 MG capsule Take 1 capsule by mouth daily. 04/24/16   [provider]  omeprazole (PRILOSEC) 20 MG capsule Take 20 mg by mouth daily.    [provider]  Oxycodone HCl 10 MG TABS Take 10 mg by mouth every 6 (six) hours as needed (Pain).    [provider]  predniSONE (DELTASONE) 10 MG tablet Take 6 tablets day one, 5 tablets day two, 4 tablets day three, 3 tablets day four, 2 tablets day five, then 1 tablet day six 05/18/16   Triplett, Tammy, PA-C  tamsulosin (FLOMAX) 0.4 MG CAPS capsule Take 0.4 mg by mouth daily. 04/19/12   [provider]  topiramate (TOPAMAX) 50 MG tablet Take 1 tablet by mouth 2 (two) times daily. 05/01/16   [provider]  VENTOLIN HFA 108 (90 BASE) MCG/ACT inhaler Inhale 2 puffs into the lungs every 4 (four) hours as needed. 03/16/15   [provider]  verapamil (CALAN-SR) 240 MG CR tablet Take 240 mg by mouth daily. 11/19/08   [provider]    Family History History reviewed. No pertinent family history.  Social History Social History  Substance Use Topics  . Smoking status: Current Every Day Smoker    Packs/day: 0.50    Years: 40.00    Types:  Cigarettes  . Smokeless tobacco: Never Used  . Alcohol use Yes     Comment: Occ     Allergies   Patient has no known allergies.   Review of Systems Review of Systems  Constitutional: Negative for chills and fever.  Gastrointestinal: Positive for abdominal pain, nausea and vomiting. Negative for abdominal distention.  All other systems reviewed and are negative.  Physical Exam Updated Vital Signs BP 122/65 (BP Location: Left Arm)   Pulse 76   Temp 97.8 F (36.6 C) (Oral)   Resp 18   SpO2 96%   Physical Exam  Constitutional: He appears well-developed and  well-nourished.  Appears uncomfortable.   HENT:  Head: Normocephalic.  Right Ear: External ear normal.  Left Ear: External ear normal.  Nose: Nose normal.  Eyes: Conjunctivae are normal. Right eye exhibits no discharge. Left eye exhibits no discharge.  Neck: Normal range of motion.  Cardiovascular: Normal rate, regular rhythm and normal heart sounds.   No murmur heard. Pulmonary/Chest: Effort normal. No respiratory distress. He has no wheezes. He has no rales.  Coarse breath sounds bilaterally.   Abdominal: Soft. He exhibits distension. There is tenderness. There is no rebound and no guarding.  Tender in the epigastrium. Mild distension.   Musculoskeletal: Normal range of motion. He exhibits no edema or tenderness.  Neurological: He is alert. No cranial nerve deficit. Coordination normal.  Skin: Skin is warm and dry. No rash noted. No erythema. No pallor.  Psychiatric: He has a normal mood and affect. His behavior is normal.  Nursing note and vitals reviewed.  ED Treatments / Results  DIAGNOSTIC STUDIES: Oxygen Saturation is 96% on RA, normal by my interpretation.   COORDINATION OF CARE: 10:53 PM-Discussed next steps with pt. Pt verbalized understanding and is agreeable with the plan.   Labs (all labs ordered are listed, but only abnormal results are displayed) Labs Reviewed  COMPREHENSIVE METABOLIC PANEL - Abnormal; Notable for the following:       Result Value   Glucose, Bld 151 (*)    All other components within normal limits  CBC - Abnormal; Notable for the following:    WBC 11.4 (*)    RDW 15.6 (*)    All other components within normal limits  LIPASE, BLOOD  URINALYSIS, ROUTINE W REFLEX MICROSCOPIC    EKG  EKG Interpretation None       Radiology No results found.  Procedures Procedures (including critical care time)  Medications Ordered in ED Medications  gi cocktail (Maalox,Lidocaine,Donnatal) (not administered)  ondansetron (ZOFRAN) injection 4 mg  (4 mg Intravenous Given 09/17/16 2238)  sodium chloride 0.9 % bolus 1,000 mL (1,000 mLs Intravenous New Bag/Given 09/17/16 2336)  HYDROmorphone (DILAUDID) injection 1 mg (1 mg Intravenous Given 09/17/16 2334)  promethazine (PHENERGAN) injection 12.5 mg (12.5 mg Intravenous Given 09/17/16 2331)   Initial Impression / Assessment and Plan / ED Course  I have reviewed the triage vital signs and the nursing notes.  Pertinent labs & imaging results that were available during my care of the patient were reviewed by me and considered in my medical decision making (see chart for details).     65yM with acute onset of abdominal pain. Tender in epigastrium. Reported hx of ETOH abuse but says just occasional drinker. Lipase and LFTS normal. Denies known hx of PUD/GERD. Pain began just after eating [employer]. Consider biliary colic. Unfortunately cannot obtain US at this time. With the degree of pain he was in and continue n/v. Will CT.  Final Clinical Impressions(s) / ED Diagnoses   Final diagnoses:  Epigastric pain   New Prescriptions New Prescriptions   No medications on file   I personally preformed the services scribed in my presence. The recorded information has been reviewed is accurate. Virgel Manifold, MD.     Virgel Manifold, MD 09/21/16 1052

## 2016-09-18 DIAGNOSIS — R1013 Epigastric pain: Secondary | ICD-10-CM | POA: Diagnosis not present

## 2016-09-18 MED ORDER — OMEPRAZOLE 20 MG PO CPDR: 20.0000 mg | DELAYED_RELEASE_CAPSULE | Freq: Every day | ORAL | 0 refills | Status: DC

## 2016-09-18 MED ORDER — ONDANSETRON 4 MG PO TBDP: 4.0000 mg | ORAL_TABLET | Freq: Three times a day (TID) | ORAL | 0 refills | Status: DC | PRN

## 2016-09-18 MED ORDER — IOPAMIDOL (ISOVUE-300) INJECTION 61%
100.0000 mL | Freq: Once | INTRAVENOUS | Status: AC | PRN
  Administered 2016-09-18: 100 mL via INTRAVENOUS

## 2016-09-18 NOTE — Discharge Instructions (Signed)
You were seen today for upper abdominal pain. Her workup is largely reassuring. Continue your omeprazole at home. If you have any new or worsening symptoms you needs to be reevaluated.  Your CT scan did show some lower intestinal thickening. They recommend colonoscopy. Follow-up with gastroenterology for scheduling a colonoscopy. Her CT scan also showed a lesion on one of your kidneys. You need follow-up with her primary physician to arrange for outpatient MRI.

## 2016-09-18 NOTE — ED Provider Notes (Signed)
Patient signed out pending CT scan.  In brief, patient presents with upper epigastric abdominal pain onset after eating. Workup is largely reassuring. Mild leukocytosis. CT scan is reassuring. There are a few incidental findings including cecal thickening and a kidney lesion both recommending outpatient follow-up. On recheck, patient reports some mild persistent pain but overall improvement. He did tolerate a GI cocktail with some improvement and is able to tolerate fluids. Feel he is stable for discharge home. He was advised of the incidental findings on CT scan and follow-up recommendations.  After history, exam, and medical workup I feel the patient has been appropriately medically screened and is safe for discharge home. Pertinent diagnoses were discussed with the patient. Patient was given return precautions.    Merryl Hacker, MD 09/18/16 [phone]

## 2016-09-18 NOTE — ED Notes (Signed)
Patient transported to CT 

## 2016-09-18 NOTE — ED Notes (Signed)
Pt tolerating PO water.

## 2016-10-13 ENCOUNTER — Ambulatory Visit: Admission: RE | Admit: 2016-10-13 | Discharge: 2016-10-13 | Disposition: A | Discharge status: Home / Self Care

## 2016-10-13 DIAGNOSIS — M545 Low back pain: Principal | ICD-10-CM

## 2016-10-13 DIAGNOSIS — M48061 Spinal stenosis, lumbar region without neurogenic claudication: Secondary | ICD-10-CM

## 2016-10-13 DIAGNOSIS — G8929 Other chronic pain: Secondary | ICD-10-CM

## 2016-10-13 DIAGNOSIS — M25512 Pain in left shoulder: Secondary | ICD-10-CM

## 2016-10-13 DIAGNOSIS — M5416 Radiculopathy, lumbar region: Secondary | ICD-10-CM

## 2016-12-21 ENCOUNTER — Ambulatory Visit
Admission: RE | Admit: 2016-12-21 | Discharge: 2016-12-21 | Disposition: A | Discharge status: Home / Self Care | Payer: MEDICARE

## 2016-12-21 ENCOUNTER — Ambulatory Visit
Admission: RE | Admit: 2016-12-21 | Discharge: 2016-12-21 | Disposition: A | Discharge status: Home / Self Care | Payer: MEDICARE | Attending: Medical Oncology | Admitting: Medical Oncology

## 2016-12-21 DIAGNOSIS — C61 Malignant neoplasm of prostate: Principal | ICD-10-CM

## 2016-12-21 DIAGNOSIS — G8929 Other chronic pain: Secondary | ICD-10-CM

## 2016-12-21 DIAGNOSIS — C7951 Secondary malignant neoplasm of bone: Secondary | ICD-10-CM

## 2016-12-21 DIAGNOSIS — M545 Low back pain: Secondary | ICD-10-CM

## 2016-12-21 DIAGNOSIS — M5416 Radiculopathy, lumbar region: Secondary | ICD-10-CM

## 2016-12-21 DIAGNOSIS — R232 Flushing: Secondary | ICD-10-CM

## 2017-01-14 ENCOUNTER — Encounter (HOSPITAL_COMMUNITY): Payer: Self-pay | Admitting: Emergency Medicine

## 2017-01-14 ENCOUNTER — Emergency Department (HOSPITAL_COMMUNITY): Payer: Medicare Other

## 2017-01-14 ENCOUNTER — Inpatient Hospital Stay (HOSPITAL_COMMUNITY)
Admission: EM | Admit: 2017-01-14 | Discharge: 2017-01-16 | DRG: 193 | Disposition: A | Discharge status: Home / Self Care | Payer: Medicare Other | Attending: Family Medicine | Admitting: Family Medicine

## 2017-01-14 DIAGNOSIS — E7800 Pure hypercholesterolemia, unspecified: Secondary | ICD-10-CM | POA: Diagnosis not present

## 2017-01-14 DIAGNOSIS — K5903 Drug induced constipation: Secondary | ICD-10-CM | POA: Diagnosis not present

## 2017-01-14 DIAGNOSIS — E871 Hypo-osmolality and hyponatremia: Secondary | ICD-10-CM | POA: Diagnosis present

## 2017-01-14 DIAGNOSIS — F17200 Nicotine dependence, unspecified, uncomplicated: Secondary | ICD-10-CM | POA: Diagnosis present

## 2017-01-14 DIAGNOSIS — Z23 Encounter for immunization: Secondary | ICD-10-CM

## 2017-01-14 DIAGNOSIS — E860 Dehydration: Secondary | ICD-10-CM | POA: Diagnosis present

## 2017-01-14 DIAGNOSIS — J9601 Acute respiratory failure with hypoxia: Secondary | ICD-10-CM | POA: Diagnosis not present

## 2017-01-14 DIAGNOSIS — T402X5A Adverse effect of other opioids, initial encounter: Secondary | ICD-10-CM | POA: Diagnosis not present

## 2017-01-14 DIAGNOSIS — A419 Sepsis, unspecified organism: Secondary | ICD-10-CM | POA: Diagnosis present

## 2017-01-14 DIAGNOSIS — Z8546 Personal history of malignant neoplasm of prostate: Secondary | ICD-10-CM

## 2017-01-14 DIAGNOSIS — I10 Essential (primary) hypertension: Secondary | ICD-10-CM | POA: Diagnosis not present

## 2017-01-14 DIAGNOSIS — J189 Pneumonia, unspecified organism: Secondary | ICD-10-CM | POA: Diagnosis present

## 2017-01-14 DIAGNOSIS — J181 Lobar pneumonia, unspecified organism: Principal | ICD-10-CM | POA: Diagnosis present

## 2017-01-14 DIAGNOSIS — M6281 Muscle weakness (generalized): Secondary | ICD-10-CM | POA: Diagnosis present

## 2017-01-14 DIAGNOSIS — F1120 Opioid dependence, uncomplicated: Secondary | ICD-10-CM | POA: Diagnosis present

## 2017-01-14 DIAGNOSIS — F17210 Nicotine dependence, cigarettes, uncomplicated: Secondary | ICD-10-CM | POA: Diagnosis present

## 2017-01-14 DIAGNOSIS — D72829 Elevated white blood cell count, unspecified: Secondary | ICD-10-CM | POA: Diagnosis not present

## 2017-01-14 DIAGNOSIS — M549 Dorsalgia, unspecified: Secondary | ICD-10-CM | POA: Diagnosis not present

## 2017-01-14 DIAGNOSIS — G8929 Other chronic pain: Secondary | ICD-10-CM | POA: Diagnosis not present

## 2017-01-14 DIAGNOSIS — E785 Hyperlipidemia, unspecified: Secondary | ICD-10-CM | POA: Diagnosis present

## 2017-01-14 DIAGNOSIS — R0603 Acute respiratory distress: Secondary | ICD-10-CM | POA: Diagnosis not present

## 2017-01-14 LAB — URINALYSIS, ROUTINE W REFLEX MICROSCOPIC
Bacteria, UA: NONE SEEN
Bilirubin Urine: NEGATIVE
Glucose, UA: NEGATIVE mg/dL
Hgb urine dipstick: NEGATIVE
KETONES UR: NEGATIVE mg/dL
Leukocytes, UA: NEGATIVE
Nitrite: NEGATIVE
PROTEIN: 30 mg/dL — AB
RBC / HPF: NONE SEEN RBC/hpf (ref 0–5)
Specific Gravity, Urine: 1.023 (ref 1.005–1.030)
pH: 5 (ref 5.0–8.0)

## 2017-01-14 LAB — COMPREHENSIVE METABOLIC PANEL
ALK PHOS: 102 U/L (ref 38–126)
ALT: 17 U/L (ref 17–63)
AST: 20 U/L (ref 15–41)
Albumin: 3.8 g/dL (ref 3.5–5.0)
Anion gap: 9 (ref 5–15)
BILIRUBIN TOTAL: 1 mg/dL (ref 0.3–1.2)
BUN: 12 mg/dL (ref 6–20)
CALCIUM: 8.9 mg/dL (ref 8.9–10.3)
CO2: 25 mmol/L (ref 22–32)
CREATININE: 1.23 mg/dL (ref 0.61–1.24)
Chloride: 97 mmol/L — ABNORMAL LOW (ref 101–111)
GFR, EST NON AFRICAN AMERICAN: 59 mL/min — AB (ref >60)
Glucose, Bld: 114 mg/dL — ABNORMAL HIGH (ref 65–99)
Potassium: 4 mmol/L (ref 3.5–5.1)
Sodium: 131 mmol/L — ABNORMAL LOW (ref 135–145)
Total Protein: 7.9 g/dL (ref 6.5–8.1)

## 2017-01-14 LAB — CBC WITH DIFFERENTIAL/PLATELET
BASOS ABS: 0 10*3/uL (ref 0.0–0.1)
BASOS PCT: 0 %
Eosinophils Absolute: 0.2 10*3/uL (ref 0.0–0.7)
Eosinophils Relative: 1 %
HEMATOCRIT: 41.2 % (ref 39.0–52.0)
HEMOGLOBIN: 13.7 g/dL (ref 13.0–17.0)
Lymphocytes Relative: 9 %
Lymphs Abs: 1.6 10*3/uL (ref 0.7–4.0)
MCH: 29.5 pg (ref 26.0–34.0)
MCHC: 33.3 g/dL (ref 30.0–36.0)
MCV: 88.6 fL (ref 78.0–100.0)
MONO ABS: 1.9 10*3/uL — AB (ref 0.1–1.0)
Monocytes Relative: 11 %
NEUTROS ABS: 14.1 10*3/uL — AB (ref 1.7–7.7)
NEUTROS PCT: 79 %
Platelets: 211 10*3/uL (ref 150–400)
RBC: 4.65 MIL/uL (ref 4.22–5.81)
RDW: 14.9 % (ref 11.5–15.5)
WBC: 17.9 10*3/uL — AB (ref 4.0–10.5)

## 2017-01-14 LAB — I-STAT CG4 LACTIC ACID, ED: Lactic Acid, Venous: 1.1 mmol/L (ref 0.5–1.9)

## 2017-01-14 MED ORDER — GUAIFENESIN-CODEINE 100-10 MG/5ML PO SOLN
5.0000 mL | Freq: Four times a day (QID) | ORAL | Status: DC | PRN
  Administered 2017-01-16: 5 mL via ORAL
  Filled 2017-01-14: qty 5

## 2017-01-14 MED ORDER — ENOXAPARIN SODIUM 40 MG/0.4ML ~~LOC~~ SOLN
40.0000 mg | SUBCUTANEOUS | Status: DC
  Administered 2017-01-14 – 2017-01-15 (×2): 40 mg via SUBCUTANEOUS
  Filled 2017-01-14 (×2): qty 0.4

## 2017-01-14 MED ORDER — VANCOMYCIN HCL IN DEXTROSE 750-5 MG/150ML-% IV SOLN
750.0000 mg | Freq: Two times a day (BID) | INTRAVENOUS | Status: DC
  Administered 2017-01-15: 750 mg via INTRAVENOUS
  Filled 2017-01-14 (×4): qty 150

## 2017-01-14 MED ORDER — NICOTINE 21 MG/24HR TD PT24
21.0000 mg | MEDICATED_PATCH | Freq: Every day | TRANSDERMAL | Status: DC
  Administered 2017-01-14 – 2017-01-16 (×3): 21 mg via TRANSDERMAL
  Filled 2017-01-14 (×3): qty 1

## 2017-01-14 MED ORDER — TOPIRAMATE 25 MG PO TABS
50.0000 mg | ORAL_TABLET | Freq: Two times a day (BID) | ORAL | Status: DC
  Administered 2017-01-14 – 2017-01-16 (×4): 50 mg via ORAL
  Filled 2017-01-14 (×4): qty 2

## 2017-01-14 MED ORDER — GUAIFENESIN ER 600 MG PO TB12
600.0000 mg | ORAL_TABLET | Freq: Two times a day (BID) | ORAL | Status: DC
  Administered 2017-01-14 – 2017-01-16 (×4): 600 mg via ORAL
  Filled 2017-01-14 (×4): qty 1

## 2017-01-14 MED ORDER — CEFEPIME HCL 2 G IJ SOLR
INTRAMUSCULAR | Status: AC
  Filled 2017-01-14 (×2): qty 2

## 2017-01-14 MED ORDER — ALBUTEROL SULFATE (2.5 MG/3ML) 0.083% IN NEBU
5.0000 mg | INHALATION_SOLUTION | Freq: Once | RESPIRATORY_TRACT | Status: AC
  Administered 2017-01-14: 5 mg via RESPIRATORY_TRACT
  Filled 2017-01-14: qty 6

## 2017-01-14 MED ORDER — CEFEPIME HCL 2 G IJ SOLR
2.0000 g | Freq: Three times a day (TID) | INTRAMUSCULAR | Status: DC
  Administered 2017-01-15 (×2): 2 g via INTRAVENOUS
  Filled 2017-01-14 (×6): qty 2

## 2017-01-14 MED ORDER — TAMSULOSIN HCL 0.4 MG PO CAPS
0.4000 mg | ORAL_CAPSULE | Freq: Every day | ORAL | Status: DC
  Administered 2017-01-15 – 2017-01-16 (×2): 0.4 mg via ORAL
  Filled 2017-01-14 (×2): qty 1

## 2017-01-14 MED ORDER — PANTOPRAZOLE SODIUM 40 MG PO TBEC
40.0000 mg | DELAYED_RELEASE_TABLET | Freq: Every day | ORAL | Status: DC
  Administered 2017-01-15 – 2017-01-16 (×2): 40 mg via ORAL
  Filled 2017-01-14 (×2): qty 1

## 2017-01-14 MED ORDER — GABAPENTIN 600 MG PO TABS
600.0000 mg | ORAL_TABLET | Freq: Three times a day (TID) | ORAL | Status: DC
  Filled 2017-01-14 (×3): qty 1

## 2017-01-14 MED ORDER — GABAPENTIN 300 MG PO CAPS
ORAL_CAPSULE | ORAL | Status: AC
  Filled 2017-01-14: qty 2

## 2017-01-14 MED ORDER — SODIUM CHLORIDE 0.9 % IV SOLN
1000.0000 mL | INTRAVENOUS | Status: DC
  Administered 2017-01-14: 1000 mL via INTRAVENOUS

## 2017-01-14 MED ORDER — CEFEPIME HCL 2 G IJ SOLR
2.0000 g | Freq: Once | INTRAMUSCULAR | Status: AC
  Administered 2017-01-14: 2 g via INTRAVENOUS
  Filled 2017-01-14: qty 2

## 2017-01-14 MED ORDER — POLYETHYLENE GLYCOL 3350 17 G PO PACK
17.0000 g | PACK | Freq: Two times a day (BID) | ORAL | Status: DC
  Administered 2017-01-15 – 2017-01-16 (×3): 17 g via ORAL
  Filled 2017-01-14 (×4): qty 1

## 2017-01-14 MED ORDER — LISINOPRIL 10 MG PO TABS
40.0000 mg | ORAL_TABLET | Freq: Every day | ORAL | Status: DC
Start: 2017-01-15 — End: 2017-01-16
  Administered 2017-01-15 – 2017-01-16 (×2): 40 mg via ORAL
  Filled 2017-01-14 (×2): qty 4

## 2017-01-14 MED ORDER — ONDANSETRON 4 MG PO TBDP: 4.0000 mg | ORAL_TABLET | Freq: Three times a day (TID) | ORAL | Status: DC | PRN

## 2017-01-14 MED ORDER — GABAPENTIN 300 MG PO CAPS
600.0000 mg | ORAL_CAPSULE | Freq: Three times a day (TID) | ORAL | Status: DC
  Administered 2017-01-14 – 2017-01-16 (×6): 600 mg via ORAL
  Filled 2017-01-14 (×5): qty 2

## 2017-01-14 MED ORDER — VERAPAMIL HCL ER 240 MG PO TBCR
240.0000 mg | EXTENDED_RELEASE_TABLET | Freq: Every day | ORAL | Status: DC
  Administered 2017-01-15 – 2017-01-16 (×2): 240 mg via ORAL
  Filled 2017-01-14 (×2): qty 1

## 2017-01-14 MED ORDER — VANCOMYCIN HCL IN DEXTROSE 1-5 GM/200ML-% IV SOLN: 1000.0000 mg | Freq: Once | INTRAVENOUS | Status: DC

## 2017-01-14 MED ORDER — IPRATROPIUM-ALBUTEROL 0.5-2.5 (3) MG/3ML IN SOLN
3.0000 mL | Freq: Three times a day (TID) | RESPIRATORY_TRACT | Status: DC
  Administered 2017-01-14 (×2): 3 mL via RESPIRATORY_TRACT
  Filled 2017-01-14 (×2): qty 3

## 2017-01-14 MED ORDER — OXYCODONE HCL 10 MG PO TABS: 10.0000 mg | ORAL_TABLET | Freq: Four times a day (QID) | ORAL | Status: DC | PRN

## 2017-01-14 MED ORDER — VANCOMYCIN HCL IN DEXTROSE 750-5 MG/150ML-% IV SOLN
INTRAVENOUS | Status: AC
  Filled 2017-01-14: qty 150

## 2017-01-14 MED ORDER — CYCLOBENZAPRINE HCL 10 MG PO TABS: 5.0000 mg | ORAL_TABLET | Freq: Three times a day (TID) | ORAL | Status: DC | PRN

## 2017-01-14 MED ORDER — OXYCODONE HCL 5 MG PO TABS: 10.0000 mg | ORAL_TABLET | Freq: Four times a day (QID) | ORAL | Status: DC | PRN

## 2017-01-14 MED ORDER — INFLUENZA VAC SPLIT HIGH-DOSE 0.5 ML IM SUSY
0.5000 mL | PREFILLED_SYRINGE | INTRAMUSCULAR | Status: AC
  Administered 2017-01-15: 0.5 mL via INTRAMUSCULAR
  Filled 2017-01-14: qty 0.5

## 2017-01-14 MED ORDER — EZETIMIBE 10 MG PO TABS
10.0000 mg | ORAL_TABLET | Freq: Every day | ORAL | Status: DC
  Administered 2017-01-15 – 2017-01-16 (×2): 10 mg via ORAL
  Filled 2017-01-14 (×2): qty 1

## 2017-01-14 MED ORDER — VANCOMYCIN HCL IN DEXTROSE 1-5 GM/200ML-% IV SOLN
1000.0000 mg | INTRAVENOUS | Status: AC
  Administered 2017-01-14 (×2): 1000 mg via INTRAVENOUS
  Filled 2017-01-14 (×2): qty 200

## 2017-01-14 NOTE — Progress Notes (Signed)
Pharmacy Antibiotic Note  Willie Fowler is a 66 y.o. male admitted on 01/14/2017 with pneumonia.  Pharmacy has been consulted for cefepime and vancomycin dosing. Initial doses ordered in the ED  Plan: Cont cefepime 2gm IV q8 hours Continue vancomycin 750 mg IV q12 hours F/u renal function, cultures and clinical course  Height: 5\' 7"  (170.2 cm) Weight: 204 lb (92.5 kg) IBW/kg (Calculated) : 66.1  Temp (24hrs), Avg:99 F (37.2 C), Min:99 F (37.2 C), Max:99 F (37.2 C)   Recent Labs Lab 01/14/17 1220 01/14/17 1236  WBC 17.9*  --   CREATININE 1.23  --   LATICACIDVEN  --  1.10    Estimated Creatinine Clearance: 62.4 mL/min (by C-G formula based on SCr of 1.23 mg/dL).    No Known Allergies   Thank you for allowing pharmacy to be a part of this patient's care.  Excell Seltzer Poteet 01/14/2017 1:31 PM

## 2017-01-14 NOTE — H&P (Signed)
History and Physical  Willie Fowler [facility]MC:[id number] DOB: May 22, 1950 DOA: 01/14/2017  Referring physician: Sabra Heck MD PCP: [facility]   Chief Complaint: SOB  HPI: Willie Fowler is a 66 y.o. male smoker who presented to the emergency department with 3 days of progressive cough and shortness of breath. He also has felt fever. He reports malaise. He was recently treated for pneumonia couple of months ago and says that it feels similar. The patient was evaluated in the emergency department and noted to be hypoxic with an initial pulse ox in the 80s. He was started on supplemental oxygen in the emergency department and it rapidly improved. The patient was noted to have a leukocytosis. He had a chest x-ray that was positive for left lower lobe developing infiltrate. He did have a normal lactic acid and blood pressure. The patient does not remember the antibiotic that he took for his recent pneumonia. He reports that his albuterol inhaler helps temporarily but the effects quickly subside.  He was empirically started on broad-spectrum antibiotics to cover HCAP by the ED.  Hospitalization was requested for further treatment and supportive therapy.  Review of Systems: All systems reviewed and apart from history of presenting illness, are negative.  Past Medical History:  Diagnosis Date  . Chronic ankle pain   . Chronic back pain   . Chronic left shoulder pain   . High cholesterol   . History of alcohol abuse   . Hypertension   . Lumbar radiculopathy   . Pain management    [facility]   History reviewed. No pertinent surgical history. Social History:  reports that he has been smoking Cigarettes.  He has a 20.00 pack-year smoking history. He has never used smokeless tobacco. He reports that he drinks alcohol. He reports that he does not use drugs.  No Known Allergies  History reviewed. No pertinent family history.  Prior to Admission medications   Medication Sig Start Date  End Date Taking? Authorizing Provider  cyclobenzaprine (FLEXERIL) 10 MG tablet Take 1 tablet (10 mg total) by mouth 3 (three) times daily as needed. 05/18/16  Yes Triplett, Tammy, PA-C  ezetimibe (ZETIA) 10 MG tablet Take 10 mg by mouth daily. 08/18/11  Yes [provider]  gabapentin (NEURONTIN) 600 MG tablet Take 1 tablet by mouth 3 (three) times daily. 12/19/16  Yes [provider]  lidocaine (XYLOCAINE) 5 % ointment Apply 1 application topically daily. 02/28/15  Yes [provider]  lisinopril (PRINIVIL,ZESTRIL) 40 MG tablet Take 40 mg by mouth daily. 12/25/14  Yes [provider]  LYRICA 150 MG capsule Take 1 capsule by mouth daily. 04/24/16  Yes [provider]  omeprazole (PRILOSEC) 20 MG capsule Take 20 mg by mouth daily.   Yes [provider]  Oxycodone HCl 10 MG TABS Take 10 mg by mouth every 6 (six) hours as needed (Pain).   Yes [provider]  tamsulosin (FLOMAX) 0.4 MG CAPS capsule Take 0.4 mg by mouth daily. 04/19/12  Yes [provider]  topiramate (TOPAMAX) 50 MG tablet Take 1 tablet by mouth 2 (two) times daily. 05/01/16  Yes [provider]  VENTOLIN HFA 108 (90 BASE) MCG/ACT inhaler Inhale 2 puffs into the lungs every 4 (four) hours as needed. 03/16/15  Yes [provider]  verapamil (CALAN-SR) 240 MG CR tablet Take 240 mg by mouth daily. 11/19/08  Yes [provider]  ondansetron (ZOFRAN ODT) 4 MG disintegrating tablet Take 1 tablet (  4 mg total) by mouth every 8 (eight) hours as needed for nausea or vomiting. 09/18/16   Merryl Hacker, MD   Physical Exam: Vitals:   01/14/17 1206 01/14/17 1228 01/14/17 1232 01/14/17 1233  BP: (!) 145/57  115/67   Pulse: (!) 104  95   Resp: 18  12   Temp: 99 F (37.2 C)     TempSrc: Oral     SpO2: 91% 93% 97% 95%  Weight: 92.5 kg (204 lb)     Height: 5\' 7"  (1.702 m)        General exam: Moderately built and nourished patient, lying  comfortably supine on the gurney in no obvious distress.  Head, eyes and ENT: Nontraumatic and normocephalic. Pupils equally reacting to light and accommodation. Oral mucosa moist.  Neck: Supple. No JVD, carotid bruit or thyromegaly.  Lymphatics: No lymphadenopathy.  Respiratory system: rales LLL. Mild increased work of breathing.  Cardiovascular system: S1 and S2 heard, RRR. No JVD, murmurs, gallops, clicks or pedal edema.  Gastrointestinal system: Abdomen is nondistended, soft and nontender. Normal bowel sounds heard. No organomegaly or masses appreciated.  Central nervous system: Alert and oriented. No focal neurological deficits.  Extremities: Symmetric 5 x 5 power. Peripheral pulses symmetrically felt.   Skin: No rashes or acute findings.  Musculoskeletal system: Negative exam.  Psychiatry: Pleasant and cooperative.  Labs on Admission:  Basic Metabolic Panel:  Recent Labs Lab 01/14/17 1220  NA 131*  K 4.0  CL 97*  CO2 25  GLUCOSE 114*  BUN 12  CREATININE 1.23  CALCIUM 8.9   Liver Function Tests:  Recent Labs Lab 01/14/17 1220  AST 20  ALT 17  ALKPHOS 102  BILITOT 1.0  PROT 7.9  ALBUMIN 3.8   No results for input(s): LIPASE, AMYLASE in the last 168 hours. No results for input(s): AMMONIA in the last 168 hours. CBC:  Recent Labs Lab 01/14/17 1220  WBC 17.9*  NEUTROABS 14.1*  HGB 13.7  HCT 41.2  MCV 88.6  PLT 211   Cardiac Enzymes: No results for input(s): CKTOTAL, CKMB, CKMBINDEX, TROPONINI in the last 168 hours.  BNP (last 3 results) No results for input(s): PROBNP in the last 8760 hours. CBG: No results for input(s): GLUCAP in the last 168 hours.  Radiological Exams on Admission: Dg Chest 2 View  Result Date: 01/14/2017 CLINICAL DATA:  Cough and shortness of breath.  Subjective fever EXAM: CHEST  2 VIEW COMPARISON:  03/21/2015 FINDINGS: Questionable reticulonodular opacity in the peripheral left mid lung. No edema, effusion, or  pneumothorax. Chronic elevation of the right diaphragm, likely eventration. Normal heart size. IMPRESSION: Subtly increased markings on the left, question developing pneumonia in this setting. Electronically Signed   By: Monte Fantasia M.D.   On: 01/14/2017 13:04    EKG: Independently reviewed. Normal Sinus Rhythm  Assessment/Plan Principal Problem:   LLL pneumonia (HCC) Active Problems:   Pneumonia   Acute respiratory distress   Acute respiratory failure with hypoxia (HCC)   Chronic back pain   Opioid dependence (HCC)   High cholesterol   Current every day smoker   Leukocytosis   Constipation due to opioid therapy   Hypertension   Hyponatremia   Dehydration   1. Acute respiratory failure with hypoxia-secondary to pneumonia treat supportively, supplemental oxygen ordered, nebulizer treatments ordered. Follow clinically. 2. Left lower lobe pneumonia- continue broad-spectrum antibiotics and rapidly de-escalate as clinically appropriate.  Continue supportive therapy as noted above. 3. Chronic back pain with opioid dependence-resume  home pain medication regimen with laxatives support. 4. Current every day smoker-counseled on smoking cessation and will provide a nicotine patch. STRONGLY ADVISED CESSATION 5. Leukocytosis-secondary to pneumonia infection, follow WBC. 6. Hypertension essential-resume home blood pressure medications and follow. 7. Mild dehydration-treating with gentle IV fluids. 8. Hyponatremia-suspect secondary to dehydration, treating with normal saline IV. 9. hyperlipidemia-resume home lipid lowering medications.   DVT Prophylaxis: lovenox Code Status: full   Family Communication: bedside  Disposition Plan: Anticipate home in 1-3 days pending clinical course  Time spent: 41 mins  Irwin Brakeman, MD [facility] Pager [phone]  If 7PM-7AM, please contact night-coverage [email or web address] Password [email or web address] 01/14/2017, 1:35 PM

## 2017-01-14 NOTE — ED Provider Notes (Signed)
[facility] DEPT Provider Note   CSN: [id number] Arrival date & time: 01/14/17  1202     History   Chief Complaint Chief Complaint  Patient presents with  . Cough    HPI Willie Fowler is a 66 y.o. male.  HPI  The patient is a 66 year old male with a known history of prior alcohol abuse, in chronic pain management through the [facility], also has a history of prostate cancer, currently on Lupron. He reports to the hospital with 3 days of gradual onset but progressive and gradually worsening coughing with productive sputum and feeling short of breath. He reports no swelling of the legs, no fevers, no vomiting or diarrhea. He does report that the symptoms have gradually worsened and now become moderate to severe. He finds that the more tries to walk the more he coughs. He reports having pneumonia within the last couple of months and is worried that this may have returned. On arrival the patient's oxygen saturations were approximately 84% and he appeared dyspneic according to nursing. He does not have any chronic lung history, he still smokes 22 cigarettes per day. He denies being diagnosed with any type of reactive airway disease either COPD or asthma. He has an albuterol inhaler at home which she states helps a small amount but goes away quickly  Past Medical History:  Diagnosis Date  . Chronic ankle pain   . Chronic back pain   . Chronic left shoulder pain   . High cholesterol   . History of alcohol abuse   . Hypertension   . Lumbar radiculopathy   . Pain management    [facility]    Patient Active Problem List   Diagnosis Date Noted  . Pneumonia 01/14/2017  . LLL pneumonia ([place]) 01/14/2017  . Acute respiratory distress 01/14/2017  . Acute respiratory failure with hypoxia ([place]) 01/14/2017  . Chronic back pain 01/14/2017  . Opioid dependence ([place]) 01/14/2017  . High cholesterol 01/14/2017  . Current every day smoker 01/14/2017  . Leukocytosis  01/14/2017  . Constipation due to opioid therapy 01/14/2017  . Hypertension 01/14/2017  . Hyponatremia 01/14/2017  . Dehydration 01/14/2017    History reviewed. No pertinent surgical history.     Home Medications    Prior to Admission medications   Medication Sig Start Date End Date Taking? Authorizing Provider  cyclobenzaprine (FLEXERIL) 10 MG tablet Take 1 tablet (10 mg total) by mouth 3 (three) times daily as needed. 05/18/16  Yes Triplett, Tammy, PA-C  ezetimibe (ZETIA) 10 MG tablet Take 10 mg by mouth daily. 08/18/11  Yes [provider]  gabapentin (NEURONTIN) 600 MG tablet Take 1 tablet by mouth 3 (three) times daily. 12/19/16  Yes [provider]  lidocaine (XYLOCAINE) 5 % ointment Apply 1 application topically daily. 02/28/15  Yes [provider]  lisinopril (PRINIVIL,ZESTRIL) 40 MG tablet Take 40 mg by mouth daily. 12/25/14  Yes [provider]  LYRICA 150 MG capsule Take 1 capsule by mouth daily. 04/24/16  Yes [provider]  omeprazole (PRILOSEC) 20 MG capsule Take 20 mg by mouth daily.   Yes [provider]  Oxycodone HCl 10 MG TABS Take 10 mg by mouth every 6 (six) hours as needed (Pain).   Yes [provider]  tamsulosin (FLOMAX) 0.4 MG CAPS capsule Take 0.4 mg by mouth daily. 04/19/12  Yes [provider]  topiramate (TOPAMAX) 50 MG tablet Take 1 tablet by mouth 2 (two) times daily. 05/01/16  Yes  [provider]  VENTOLIN HFA 108 (90 BASE) MCG/ACT inhaler Inhale 2 puffs into the lungs every 4 (four) hours as needed. 03/16/15  Yes [provider]  verapamil (CALAN-SR) 240 MG CR tablet Take 240 mg by mouth daily. 11/19/08  Yes [provider]  ondansetron (ZOFRAN ODT) 4 MG disintegrating tablet Take 1 tablet (4 mg total) by mouth every 8 (eight) hours as needed for nausea or vomiting. 09/18/16   Horton, Barbette Hair, MD    Family History History reviewed. No pertinent family  history.  Social History Social History  Substance Use Topics  . Smoking status: Current Every Day Smoker    Packs/day: 0.50    Years: 40.00    Types: Cigarettes  . Smokeless tobacco: Never Used  . Alcohol use Yes     Comment: weekly     Allergies   Patient has no known allergies.   Review of Systems Review of Systems  All other systems reviewed and are negative.    Physical Exam Updated Vital Signs BP 126/63   Pulse 91   Temp 99 F (37.2 C) (Oral)   Resp 18   Ht 5\' 7"  (1.702 m)   Wt 92.5 kg (204 lb)   SpO2 94%   BMI 31.95 kg/m   Physical Exam  Constitutional: He appears well-developed and well-nourished. No distress.  HENT:  Head: Normocephalic and atraumatic.  Mouth/Throat: Oropharynx is clear and moist. No oropharyngeal exudate.  Eyes: Pupils are equal, round, and reactive to light. Conjunctivae and EOM are normal. Right eye exhibits no discharge. Left eye exhibits no discharge. No scleral icterus.  Neck: Normal range of motion. Neck supple. No JVD present. No thyromegaly present.  Cardiovascular: Normal rate, regular rhythm, normal heart sounds and intact distal pulses.  Exam reveals no gallop and no friction rub.   No murmur heard. Pulmonary/Chest: Effort normal and breath sounds normal. No respiratory distress. He has no wheezes. He has no rales.  The patient does have a slight increased work of breathing, slight tachypnea, no obvious wheezing or rales, there is some decreased air movement, no accessory muscle use, speaks in full sentences  Abdominal: Soft. Bowel sounds are normal. He exhibits no distension and no mass. There is no tenderness.  Musculoskeletal: Normal range of motion. He exhibits no edema or tenderness.  Lymphadenopathy:    He has no cervical adenopathy.  Neurological: He is alert. Coordination normal.  Skin: Skin is warm and dry. No rash noted. No erythema.  Psychiatric: He has a normal mood and affect. His behavior is normal.  Nursing  note and vitals reviewed.    ED Treatments / Results  Labs (all labs ordered are listed, but only abnormal results are displayed) Labs Reviewed  CBC WITH DIFFERENTIAL/PLATELET - Abnormal; Notable for the following:       Result Value   WBC 17.9 (*)    Neutro Abs 14.1 (*)    Monocytes Absolute 1.9 (*)    All other components within normal limits  COMPREHENSIVE METABOLIC PANEL - Abnormal; Notable for the following:    Sodium 131 (*)    Chloride 97 (*)    Glucose, Bld 114 (*)    GFR calc non Af Amer 59 (*)    All other components within normal limits  CULTURE, BLOOD (ROUTINE X 2)  CULTURE, BLOOD (ROUTINE X 2)  URINALYSIS, ROUTINE W REFLEX MICROSCOPIC  I-STAT CG4 LACTIC ACID, ED    EKG  EKG Interpretation  Date/Time:  Sunday January 14 2017 13:30:39 EDT Ventricular Rate:  91 PR Interval:    QRS Duration: 75 QT Interval:  334 QTC Calculation: 411 R Axis:   48 Text Interpretation:  Sinus rhythm Normal ECG Confirmed by Noemi Chapel [phone]) on 01/14/2017 2:18:23 PM       Radiology Dg Chest 2 View  Result Date: 01/14/2017 CLINICAL DATA:  Cough and shortness of breath.  Subjective fever EXAM: CHEST  2 VIEW COMPARISON:  03/21/2015 FINDINGS: Questionable reticulonodular opacity in the peripheral left mid lung. No edema, effusion, or pneumothorax. Chronic elevation of the right diaphragm, likely eventration. Normal heart size. IMPRESSION: Subtly increased markings on the left, question developing pneumonia in this setting. Electronically Signed   By: Monte Fantasia M.D.   On: 01/14/2017 13:04    Procedures .Critical Care Performed by: Noemi Chapel Authorized by: Noemi Chapel   Critical care provider statement:    Critical care time (minutes):  35   Critical care was necessary to treat or prevent imminent or life-threatening deterioration of the following conditions:  Respiratory failure and sepsis   Critical care was time spent personally by me on the following  activities:  Ordering and performing treatments and interventions, ordering and review of laboratory studies, ordering and review of radiographic studies, pulse oximetry, re-evaluation of patient's condition, review of old charts, obtaining history from patient or surrogate, examination of patient, evaluation of patient's response to treatment and discussions with consultants   I assumed direction of critical care for this patient from another provider in my specialty: no      (including critical care time)  Medications Ordered in ED Medications  0.9 %  sodium chloride infusion (1,000 mLs Intravenous New Bag/Given 01/14/17 1320)  vancomycin (VANCOCIN) IVPB 1000 mg/200 mL premix (1,000 mg Intravenous New Bag/Given 01/14/17 1358)  albuterol (PROVENTIL) (2.5 MG/3ML) 0.083% nebulizer solution 5 mg (5 mg Nebulization Given 01/14/17 1228)  ceFEPIme (MAXIPIME) 2 g in dextrose 5 % 50 mL IVPB (0 g Intravenous Stopped 01/14/17 1358)     Initial Impression / Assessment and Plan / ED Course  I have reviewed the triage vital signs and the nursing notes.  Pertinent labs & imaging results that were available during my care of the patient were reviewed by me and considered in my medical decision making (see chart for details).     At this time the patient does not appear to have severe respiratory distress however he does have hypoxia that is requiring supplemental oxygen therapy at 2 L. He is able to speak in full sentences and there are no focal findings to correlate with an obvious pneumonia, chest x-ray will be ordered, labs  The patient's labs show a significant leukocytosis of 17,900 with predominance of neutrophils, metabolic panel was reassuring, lactic acid was 1.1 and the chest x-ray revealed a left-sided infiltrate. This is consistent with the patient's progressive shortness of breath coughing and hypoxia.  Due to his  hypoxia, leukocytosis and progressive respiratory dysfunction it appears that  the patient does have sepsis from a pneumonia. He was recently treated with antibiotics as an outpatient though he cannot remember what they were, we will treat him for healthcare associated given his recurrence, code sepsis activated, hospitalist consult for admission.  D/w Dr. Wynetta Emery who will admit   Final Clinical Impressions(s) / ED Diagnoses   Final diagnoses:  Community acquired pneumonia of left lung, unspecified part of lung  Sepsis, due to unspecified organism [facility])    New Prescriptions New Prescriptions   No medications  on file     Noemi Chapel, MD 01/14/17 [phone]

## 2017-01-14 NOTE — ED Triage Notes (Signed)
Pt reports cough since Thursday and feeling like he has a fever, but has not checked it.  Pt states the last time he felt like this he had pneumonia.

## 2017-01-14 NOTE — ED Notes (Addendum)
Toileting offered to patient. Pt denied urge to go at this time.

## 2017-01-15 ENCOUNTER — Inpatient Hospital Stay (HOSPITAL_COMMUNITY): Payer: Medicare Other

## 2017-01-15 DIAGNOSIS — K5903 Drug induced constipation: Secondary | ICD-10-CM | POA: Diagnosis not present

## 2017-01-15 DIAGNOSIS — J181 Lobar pneumonia, unspecified organism: Secondary | ICD-10-CM | POA: Diagnosis not present

## 2017-01-15 DIAGNOSIS — M549 Dorsalgia, unspecified: Secondary | ICD-10-CM | POA: Diagnosis not present

## 2017-01-15 DIAGNOSIS — A419 Sepsis, unspecified organism: Secondary | ICD-10-CM | POA: Diagnosis not present

## 2017-01-15 DIAGNOSIS — J9601 Acute respiratory failure with hypoxia: Secondary | ICD-10-CM | POA: Diagnosis not present

## 2017-01-15 LAB — RESPIRATORY PANEL BY PCR
ADENOVIRUS-RVPPCR: NOT DETECTED
Bordetella pertussis: NOT DETECTED
CORONAVIRUS HKU1-RVPPCR: NOT DETECTED
CORONAVIRUS NL63-RVPPCR: NOT DETECTED
Chlamydophila pneumoniae: NOT DETECTED
Coronavirus 229E: NOT DETECTED
Coronavirus OC43: NOT DETECTED
Influenza A: NOT DETECTED
Influenza B: NOT DETECTED
METAPNEUMOVIRUS-RVPPCR: NOT DETECTED
Mycoplasma pneumoniae: NOT DETECTED
PARAINFLUENZA VIRUS 1-RVPPCR: NOT DETECTED
PARAINFLUENZA VIRUS 2-RVPPCR: NOT DETECTED
PARAINFLUENZA VIRUS 3-RVPPCR: NOT DETECTED
Parainfluenza Virus 4: NOT DETECTED
RHINOVIRUS / ENTEROVIRUS - RVPPCR: NOT DETECTED
Respiratory Syncytial Virus: NOT DETECTED

## 2017-01-15 LAB — COMPREHENSIVE METABOLIC PANEL
ALT: 14 U/L — AB (ref 17–63)
AST: 16 U/L (ref 15–41)
Albumin: 3.1 g/dL — ABNORMAL LOW (ref 3.5–5.0)
Alkaline Phosphatase: 93 U/L (ref 38–126)
Anion gap: 7 (ref 5–15)
BUN: 12 mg/dL (ref 6–20)
CHLORIDE: 101 mmol/L (ref 101–111)
CO2: 27 mmol/L (ref 22–32)
Calcium: 8.5 mg/dL — ABNORMAL LOW (ref 8.9–10.3)
Creatinine, Ser: 1.19 mg/dL (ref 0.61–1.24)
Glucose, Bld: 144 mg/dL — ABNORMAL HIGH (ref 65–99)
Potassium: 3.8 mmol/L (ref 3.5–5.1)
Sodium: 135 mmol/L (ref 135–145)
TOTAL PROTEIN: 6.8 g/dL (ref 6.5–8.1)
Total Bilirubin: 0.6 mg/dL (ref 0.3–1.2)

## 2017-01-15 LAB — CBC WITH DIFFERENTIAL/PLATELET
BASOS ABS: 0 10*3/uL (ref 0.0–0.1)
BASOS PCT: 0 %
EOS ABS: 0.2 10*3/uL (ref 0.0–0.7)
EOS PCT: 2 %
HCT: 36.4 % — ABNORMAL LOW (ref 39.0–52.0)
Hemoglobin: 12 g/dL — ABNORMAL LOW (ref 13.0–17.0)
LYMPHS PCT: 13 %
Lymphs Abs: 1.5 10*3/uL (ref 0.7–4.0)
MCH: 30.1 pg (ref 26.0–34.0)
MCHC: 33 g/dL (ref 30.0–36.0)
MCV: 91.2 fL (ref 78.0–100.0)
Monocytes Absolute: 1.3 10*3/uL — ABNORMAL HIGH (ref 0.1–1.0)
Monocytes Relative: 11 %
Neutro Abs: 8.3 10*3/uL — ABNORMAL HIGH (ref 1.7–7.7)
Neutrophils Relative %: 74 %
PLATELETS: 192 10*3/uL (ref 150–400)
RBC: 3.99 MIL/uL — AB (ref 4.22–5.81)
RDW: 15.2 % (ref 11.5–15.5)
WBC: 11.3 10*3/uL — AB (ref 4.0–10.5)

## 2017-01-15 LAB — MAGNESIUM: MAGNESIUM: 2.3 mg/dL (ref 1.7–2.4)

## 2017-01-15 LAB — STREP PNEUMONIAE URINARY ANTIGEN: STREP PNEUMO URINARY ANTIGEN: NEGATIVE

## 2017-01-15 MED ORDER — IPRATROPIUM-ALBUTEROL 0.5-2.5 (3) MG/3ML IN SOLN
3.0000 mL | Freq: Four times a day (QID) | RESPIRATORY_TRACT | Status: DC
  Administered 2017-01-15: 3 mL via RESPIRATORY_TRACT
  Filled 2017-01-15: qty 3

## 2017-01-15 MED ORDER — IPRATROPIUM-ALBUTEROL 0.5-2.5 (3) MG/3ML IN SOLN: 3.0000 mL | Freq: Four times a day (QID) | RESPIRATORY_TRACT | Status: DC | PRN

## 2017-01-15 MED ORDER — DOXYCYCLINE HYCLATE 100 MG PO TABS
100.0000 mg | ORAL_TABLET | Freq: Two times a day (BID) | ORAL | Status: DC
  Administered 2017-01-16: 100 mg via ORAL
  Filled 2017-01-15: qty 1

## 2017-01-15 MED ORDER — SODIUM CHLORIDE 0.9 % IV SOLN
1000.0000 mL | INTRAVENOUS | Status: AC
  Administered 2017-01-15: 1000 mL via INTRAVENOUS

## 2017-01-15 MED ORDER — OXYBUTYNIN CHLORIDE 5 MG PO TABS
5.0000 mg | ORAL_TABLET | Freq: Every day | ORAL | Status: DC
  Administered 2017-01-15 – 2017-01-16 (×2): 5 mg via ORAL
  Filled 2017-01-15 (×2): qty 1

## 2017-01-15 MED ORDER — OXYBUTYNIN CHLORIDE 5 MG PO TABS
15.0000 mg | ORAL_TABLET | Freq: Every day | ORAL | Status: DC
  Administered 2017-01-15: 15 mg via ORAL
  Filled 2017-01-15: qty 3

## 2017-01-15 NOTE — Evaluation (Signed)
Physical Therapy Evaluation Patient Details Name: Willie Fowler MRN: [id number] DOB: [birth year] Today's Date: 01/15/2017   History of Present Illness  Willie Fowler is a 66 y.o. male smoker who presented to the emergency department with 3 days of progressive cough and shortness of breath. He also has felt fever. He reports malaise. He was recently treated for pneumonia couple of months ago and says that it feels similar. The patient was evaluated in the emergency department and noted to be hypoxic with an initial pulse ox in the 80s. He was started on supplemental oxygen in the emergency department and it rapidly improved. The patient was noted to have a leukocytosis. He had a chest x-ray that was positive for left lower lobe developing infiltrate. He did have a normal lactic acid and blood pressure. The patient does not remember the antibiotic that he took for his recent pneumonia. He reports that his albuterol inhaler helps temporarily but the effects quickly subside.  He was empirically started on broad-spectrum antibiotics to cover HCAP by the ED.  Hospitalization was requested for further treatment and supportive therapy    Clinical Impression  Patient functioning at baseline for functional mobility and gait.  Patient discharged from physical therapy to care of nursing for ambulation daily as tolerated for length of stay.    Follow Up Recommendations No PT follow up    Equipment Recommendations  None recommended by PT    Recommendations for Other Services       Precautions / Restrictions Precautions Precautions: None Restrictions Weight Bearing Restrictions: No      Mobility  Bed Mobility Overal bed mobility: Independent                Transfers Overall transfer level: Independent                  Ambulation/Gait Ambulation/Gait assistance: Independent Ambulation Distance (Feet): 150 Feet Assistive device: None Gait Pattern/deviations: WFL(Within Functional  Limits)   Gait velocity interpretation: at or above normal speed for age/gender    Stairs            Wheelchair Mobility    Modified Rankin (Stroke Patients Only)       Balance Overall balance assessment: No apparent balance deficits (not formally assessed)                                           Pertinent Vitals/Pain Pain Assessment: 0-10 Pain Score: 4  Pain Location: left forearm at site of IV insertion Pain Descriptors / Indicators: Discomfort Pain Intervention(s): Limited activity within patient's tolerance;Monitored during session    Home Living Family/patient expects to be discharged to:: Private residence Living Arrangements: Other relatives Available Help at Discharge: Family Type of Home: Mobile home Home Access: Stairs to enter Entrance Stairs-Rails: Can reach both;Left;Right Entrance Stairs-Number of Steps: 4 Home Layout: One level Home Equipment: None      Prior Function Level of Independence: Independent               Hand Dominance        Extremity/Trunk Assessment   Upper Extremity Assessment Upper Extremity Assessment: Overall WFL for tasks assessed    Lower Extremity Assessment Lower Extremity Assessment: Overall WFL for tasks assessed    Cervical / Trunk Assessment Cervical / Trunk Assessment: Normal  Communication   Communication: No difficulties  Cognition Arousal/Alertness: Awake/alert Behavior  During Therapy: WFL for tasks assessed/performed Overall Cognitive Status: Within Functional Limits for tasks assessed                                        General Comments      Exercises     Assessment/Plan    PT Assessment Patent does not need any further PT services  PT Problem List         PT Treatment Interventions      PT Goals (Current goals can be found in the Care Plan section)  Acute Rehab PT Goals Patient Stated Goal: return home Time For Goal Achievement:  01/15/17 Potential to Achieve Goals: Good    Frequency     Barriers to discharge        Co-evaluation               AM-PAC PT "6 Clicks" Daily Activity  Outcome Measure Difficulty turning over in bed (including adjusting bedclothes, sheets and blankets)?: None Difficulty moving from lying on back to sitting on the side of the bed? : None Difficulty sitting down on and standing up from a chair with arms (e.g., wheelchair, bedside commode, etc,.)?: None Help needed moving to and from a bed to chair (including a wheelchair)?: None Help needed walking in hospital room?: None Help needed climbing 3-5 steps with a railing? : None 6 Click Score: 24    End of Session   Activity Tolerance: Patient tolerated treatment well Patient left: in bed;with call bell/phone within reach (seated at bedside) Nurse Communication: Mobility status PT Visit Diagnosis: Unsteadiness on feet (R26.81);Other abnormalities of gait and mobility (R26.89);Muscle weakness (generalized) (M62.81)    Time: 5974-1638 PT Time Calculation (min) (ACUTE ONLY): 25 min   Charges:   PT Evaluation $PT Eval Low Complexity: 1 Low PT Treatments $Therapeutic Activity: 23-37 mins   PT G Codes:   PT G-Codes **NOT FOR INPATIENT CLASS** Functional Assessment Tool Used: AM-PAC 6 Clicks Basic Mobility Functional Limitation: Mobility: Walking and moving around Mobility: Walking and Moving Around Current Status (G5364): 0 percent impaired, limited or restricted Mobility: Walking and Moving Around Goal Status (W8032): 0 percent impaired, limited or restricted Mobility: Walking and Moving Around Discharge Status [phone]): 0 percent impaired, limited or restricted    2:44 PM, 01/15/17 Lonell Grandchild, MPT Physical Therapist with [facility] [phone] office [phone] mobile phone

## 2017-01-15 NOTE — Progress Notes (Addendum)
PROGRESS NOTE  Willie Fowler  XNT:[id number]  DOB: October 29, 1950  DOA: 01/14/2017 PCP: [facility]   Brief Admission Hx: Willie Fowler is a 66 y.o. male smoker who presented to the emergency department with 3 days of progressive cough and shortness of breath. He also has felt fever. He reports malaise. He was recently treated for pneumonia couple of months ago and says that it feels similar. The patient was evaluated in the emergency department and noted to be hypoxic with an initial pulse ox in the 80s.  He was admitted with LLL Pneumonia.     MDM/Assessment & Plan:    1. Acute respiratory failure with hypoxia-secondary to pneumonia treat supportively, supplemental oxygen ordered, nebulizer treatments ordered. Follow clinically.  De-escalate antibiotics.  Plan to transition to oral doxycycline tomorrow morning.    2. Left lower lobe pneumonia- De escalate antibiotics,  Start oral doxycycline tomorrow morning.    Continue supportive therapy as noted above.  Repeat chest xray shows improvement.   3. Sepsis has been ruled out. 4. Chronic back pain with opioid dependence-resume home pain medication regimen with laxatives support. 5. Current every day smoker-counseled on smoking cessation and will provide a nicotine patch. STRONGLY ADVISED CESSATION 6. Leukocytosis-secondary to pneumonia infection, follow WBC. 7. Hypertension essential-resume home blood pressure medications and follow. 8. Mild dehydration-treated with gentle IV fluids which will complete later today.   9. Hyponatremia-suspect secondary to dehydration, treating with normal saline IV. 10. hyperlipidemia-resume home lipid lowering medications.   DVT Prophylaxis: lovenox Code Status: full   Family Communication: bedside  Disposition Plan: Anticipate home tomorrow if stable.   Subjective: Pt reports that he feel better today.   He denies CP. He denies SOB but has some cough.  He has been up  urinating most of the night.    Objective: Vitals:   01/14/17 1615 01/14/17 2009 01/14/17 2050 01/15/17 0404  BP:   (!) 106/49 (!) 136/53  Pulse:   81 80  Resp:      Temp:   (!) 100.6 F (38.1 C) 98.9 F (37.2 C)  TempSrc:   Oral   SpO2: 96% 95% 98% 100%  Weight:      Height:        Intake/Output Summary (Last 24 hours) at 01/15/17 0934 Last data filed at 01/15/17 0900  Gross per 24 hour  Intake              730 ml  Output                0 ml  Net              730 ml   Filed Weights   01/14/17 1206 01/14/17 1547  Weight: 92.5 kg (204 lb) 92.5 kg (203 lb 14.4 oz)     REVIEW OF SYSTEMS  As per history otherwise all reviewed and reported negative  Exam:  General exam: awake, alert, NAD. Cooperative.   Respiratory system:  No increased work of breathing. Cardiovascular system: S1 & S2 heard, RRR. No JVD, murmurs, gallops, clicks or pedal edema. Gastrointestinal system: Abdomen is nondistended, soft and nontender. Normal bowel sounds heard. Central nervous system: Alert and oriented. No focal neurological deficits. Extremities: no CCE.  Data Reviewed: Basic Metabolic Panel:  Recent Labs Lab 01/14/17 1220  NA 131*  K 4.0  CL 97*  CO2 25  GLUCOSE 114*  BUN 12  CREATININE 1.23  CALCIUM 8.9   Liver Function Tests:  Recent Labs Lab 01/14/17 1220  AST 20  ALT 17  ALKPHOS 102  BILITOT 1.0  PROT 7.9  ALBUMIN 3.8   No results for input(s): LIPASE, AMYLASE in the last 168 hours. No results for input(s): AMMONIA in the last 168 hours. CBC:  Recent Labs Lab 01/14/17 1220  WBC 17.9*  NEUTROABS 14.1*  HGB 13.7  HCT 41.2  MCV 88.6  PLT 211   Cardiac Enzymes: No results for input(s): CKTOTAL, CKMB, CKMBINDEX, TROPONINI in the last 168 hours. CBG (last 3)  No results for input(s): GLUCAP in the last 72 hours. Recent Results (from the past 240 hour(s))  Blood Culture (routine x 2)     Status: None (Preliminary result)   Collection Time: 01/14/17   1:19 PM  Result Value Ref Range Status   Specimen Description RIGHT ANTECUBITAL  Final   Special Requests   Final    BOTTLES DRAWN AEROBIC AND ANAEROBIC Blood Culture adequate volume   Culture NO GROWTH < 24 HOURS  Final   Report Status PENDING  Incomplete  Blood Culture (routine x 2)     Status: None (Preliminary result)   Collection Time: 01/14/17  1:20 PM  Result Value Ref Range Status   Specimen Description RIGHT ANTECUBITAL  Final   Special Requests   Final    BOTTLES DRAWN AEROBIC AND ANAEROBIC Blood Culture adequate volume   Culture NO GROWTH < 24 HOURS  Final   Report Status PENDING  Incomplete     Studies: Dg Chest 2 View  Result Date: 01/14/2017 CLINICAL DATA:  Cough and shortness of breath.  Subjective fever EXAM: CHEST  2 VIEW COMPARISON:  03/21/2015 FINDINGS: Questionable reticulonodular opacity in the peripheral left mid lung. No edema, effusion, or pneumothorax. Chronic elevation of the right diaphragm, likely eventration. Normal heart size. IMPRESSION: Subtly increased markings on the left, question developing pneumonia in this setting. Electronically Signed   By: Monte Fantasia M.D.   On: 01/14/2017 13:04   Dg Chest Port 1 View  Result Date: 01/15/2017 CLINICAL DATA:  Pneumonia EXAM: PORTABLE CHEST 1 VIEW COMPARISON:  January 14, 2017 FINDINGS: Focal opacity in the left mid lung noted 1 day prior has resolved. Currently there is no edema or consolidation. Heart size and pulmonary vascularity are normal. No adenopathy. No appreciable bone lesions. IMPRESSION: Currently no edema or consolidation. Resolution of opacity left mid lung compared to 1 day prior. Electronically Signed   By: Lowella Grip III M.D.   On: 01/15/2017 07:28   Scheduled Meds: . [START ON 01/16/2017] doxycycline  100 mg Oral Q12H  . enoxaparin (LOVENOX) injection  40 mg Subcutaneous Q24H  . ezetimibe  10 mg Oral Daily  . gabapentin  600 mg Oral TID  . guaiFENesin  600 mg Oral BID  . Influenza  vac split quadrivalent PF  0.5 mL Intramuscular Tomorrow-1000  . ipratropium-albuterol  3 mL Nebulization Q6H  . lisinopril  40 mg Oral Daily  . nicotine  21 mg Transdermal Daily  . pantoprazole  40 mg Oral Daily  . polyethylene glycol  17 g Oral BID  . tamsulosin  0.4 mg Oral Daily  . topiramate  50 mg Oral BID  . verapamil  240 mg Oral Daily   Continuous Infusions: . sodium chloride    . ceFEPime (MAXIPIME) IV 2 g (01/15/17 0616)  . vancomycin Stopped (01/15/17 0258)    Principal Problem:   LLL pneumonia (HCC) Active Problems:   Pneumonia   Acute respiratory distress  Acute respiratory failure with hypoxia (HCC)   Chronic back pain   Opioid dependence ([place])   High cholesterol   Current every day smoker   Leukocytosis   Constipation due to opioid therapy   Hypertension   Hyponatremia   Dehydration   Time spent:   Irwin Brakeman, MD, FAAFP [facility] Pager [phone]  If 7PM-7AM, please contact night-coverage [email or web address] Password [email or web address] 01/15/2017, 9:34 AM    LOS: 1 day

## 2017-01-15 NOTE — Progress Notes (Signed)
OT Cancellation Note  Patient Details Name: Willie Fowler MRN: [id number] DOB: 1951-01-04   Cancelled Treatment:    Reason Eval/Treat Not Completed: OT screened, no needs identified, will sign off   Ailene Ravel, OTR/L,CBIS  [phone]  01/15/2017, 5:57 PM

## 2017-01-16 DIAGNOSIS — R0603 Acute respiratory distress: Secondary | ICD-10-CM | POA: Diagnosis not present

## 2017-01-16 DIAGNOSIS — Z23 Encounter for immunization: Secondary | ICD-10-CM | POA: Diagnosis not present

## 2017-01-16 DIAGNOSIS — A419 Sepsis, unspecified organism: Secondary | ICD-10-CM | POA: Diagnosis present

## 2017-01-16 DIAGNOSIS — J9601 Acute respiratory failure with hypoxia: Secondary | ICD-10-CM | POA: Diagnosis not present

## 2017-01-16 DIAGNOSIS — M549 Dorsalgia, unspecified: Secondary | ICD-10-CM | POA: Diagnosis not present

## 2017-01-16 DIAGNOSIS — J181 Lobar pneumonia, unspecified organism: Secondary | ICD-10-CM | POA: Diagnosis not present

## 2017-01-16 LAB — CBC WITH DIFFERENTIAL/PLATELET
BASOS ABS: 0 10*3/uL (ref 0.0–0.1)
Basophils Relative: 0 %
EOS PCT: 3 %
Eosinophils Absolute: 0.3 10*3/uL (ref 0.0–0.7)
HCT: 36.4 % — ABNORMAL LOW (ref 39.0–52.0)
HEMOGLOBIN: 11.9 g/dL — AB (ref 13.0–17.0)
LYMPHS ABS: 2.1 10*3/uL (ref 0.7–4.0)
LYMPHS PCT: 19 %
MCH: 29.5 pg (ref 26.0–34.0)
MCHC: 32.7 g/dL (ref 30.0–36.0)
MCV: 90.3 fL (ref 78.0–100.0)
Monocytes Absolute: 1.5 10*3/uL — ABNORMAL HIGH (ref 0.1–1.0)
Monocytes Relative: 13 %
NEUTROS PCT: 65 %
Neutro Abs: 7.1 10*3/uL (ref 1.7–7.7)
PLATELETS: 231 10*3/uL (ref 150–400)
RBC: 4.03 MIL/uL — AB (ref 4.22–5.81)
RDW: 15.1 % (ref 11.5–15.5)
WBC: 11 10*3/uL — AB (ref 4.0–10.5)

## 2017-01-16 LAB — COMPREHENSIVE METABOLIC PANEL
ALT: 15 U/L — AB (ref 17–63)
ANION GAP: 11 (ref 5–15)
AST: 18 U/L (ref 15–41)
Albumin: 3 g/dL — ABNORMAL LOW (ref 3.5–5.0)
Alkaline Phosphatase: 90 U/L (ref 38–126)
BUN: 10 mg/dL (ref 6–20)
CHLORIDE: 101 mmol/L (ref 101–111)
CO2: 26 mmol/L (ref 22–32)
CREATININE: 1.22 mg/dL (ref 0.61–1.24)
Calcium: 9 mg/dL (ref 8.9–10.3)
GFR, EST NON AFRICAN AMERICAN: 59 mL/min — AB (ref >60)
Glucose, Bld: 118 mg/dL — ABNORMAL HIGH (ref 65–99)
POTASSIUM: 4.2 mmol/L (ref 3.5–5.1)
SODIUM: 138 mmol/L (ref 135–145)
Total Bilirubin: 0.7 mg/dL (ref 0.3–1.2)
Total Protein: 6.9 g/dL (ref 6.5–8.1)

## 2017-01-16 LAB — HIV ANTIBODY (ROUTINE TESTING W REFLEX): HIV Screen 4th Generation wRfx: NONREACTIVE

## 2017-01-16 LAB — LEGIONELLA PNEUMOPHILA SEROGP 1 UR AG: L. pneumophila Serogp 1 Ur Ag: NEGATIVE

## 2017-01-16 LAB — MAGNESIUM: MAGNESIUM: 2.2 mg/dL (ref 1.7–2.4)

## 2017-01-16 MED ORDER — DOXYCYCLINE MONOHYDRATE 100 MG PO TABS
100.0000 mg | ORAL_TABLET | Freq: Two times a day (BID) | ORAL | 0 refills | Status: AC
Start: 2017-01-16 — End: 2017-01-21

## 2017-01-16 MED ORDER — GUAIFENESIN ER 600 MG PO TB12: 1200.0000 mg | ORAL_TABLET | Freq: Two times a day (BID) | ORAL | 0 refills | Status: AC

## 2017-01-16 MED ORDER — POLYETHYLENE GLYCOL 3350 17 G PO PACK: 17.0000 g | PACK | Freq: Two times a day (BID) | ORAL | 0 refills | Status: AC

## 2017-01-16 NOTE — Discharge Summary (Signed)
Physician Discharge Summary  Willie Fowler:[id number] DOB: 04-29-1950 DOA: 01/14/2017  PCP: [facility] date: 01/14/2017 Discharge date: 01/16/2017  Admitted From: Home  Disposition: Home   Recommendations for Outpatient Follow-up:  1. Follow up with PCP in 1 weeks 2. Please obtain BMP/CBC in one week 3. Please obtain a repeat chest xray in 3-4 weeks to ensure full resolution of pneumonia 4. Please follow up on the following pending results: final culture results  Discharge Condition: STABLE   CODE STATUS: FULL    Brief Hospitalization Summary: Please see all hospital notes, images, labs for full details of the hospitalization.  HPI: Willie Fowler is a 66 y.o. male smoker who presented to the emergency department with 3 days of progressive cough and shortness of breath. He also has felt fever. He reports malaise. He was recently treated for pneumonia couple of months ago and says that it feels similar. The patient was evaluated in the emergency department and noted to be hypoxic with an initial pulse ox in the 80s. He was started on supplemental oxygen in the emergency department and it rapidly improved. The patient was noted to have a leukocytosis. He had a chest x-ray that was positive for left lower lobe developing infiltrate. He did have a normal lactic acid and blood pressure. The patient does not remember the antibiotic that he took for his recent pneumonia. He reports that his albuterol inhaler helps temporarily but the effects quickly subside.  He was empirically started on broad-spectrum antibiotics to cover HCAP by the ED.  Hospitalization was requested for further treatment and supportive therapy.  Brief Admission Hx: Willie Fowler a 66 y.o.malesmoker who presented to the emergency department with 3 days of progressive cough and shortness of breath. He also has felt fever. He reports malaise. He was recently treated for pneumonia couple  of months ago and says that it feels similar. The patient was evaluated in the emergency department and noted to be hypoxic with an initial pulse ox in the 80s.  He was admitted with LLL Pneumonia.     MDM/Assessment & Plan:   1. Acute respiratory failure with hypoxia-secondary to pneumonia treat supportively, supplemental oxygen ordered, nebulizer treatments ordered. Follow clinically.  De-escalate antibiotics.  Discharge home on oral antibiotics doxycycline 100 BID.     2. Left lower lobe pneumonia- clinically much improved.    Repeat chest xray shows improvement.   3. Sepsis has been ruled out. 4. Chronic back pain with opioid dependence-resume home pain medication regimen with laxatives support. 5. Current every day smoker-counseled on smoking cessation and will provide a nicotine patch. STRONGLY ADVISED CESSATION 6. Leukocytosis-secondary to pneumonia infection, follow WBC. 7. Hypertension essential-resume home blood pressure medications and follow. 8. Mild dehydration-treated with gentle IV fluids.   9. Hyponatremia-RESOLVED suspect secondary to dehydration, treated with normal saline IV. 10. hyperlipidemia-resume home lipid lowering medications.  DVT Prophylaxis:lovenox Code Status:full  Family Communication:bedside Disposition Plan:Home   Discharge Diagnoses:  Principal Problem:   LLL pneumonia ([facility]) Active Problems:   Pneumonia   Acute respiratory distress   Acute respiratory failure with hypoxia (HCC)   Chronic back pain   Opioid dependence (HCC)   High cholesterol   Current every day smoker   Leukocytosis   Constipation due to opioid therapy   Hypertension   Hyponatremia   Dehydration  Discharge Instructions: Discharge Instructions    Call MD for:  difficulty breathing, headache or visual disturbances  Complete by:  As directed    Call MD for:  extreme fatigue    Complete by:  As directed    Call MD for:  hives    Complete by:  As directed    Call  MD for:  persistant dizziness or light-headedness    Complete by:  As directed    Call MD for:  persistant nausea and vomiting    Complete by:  As directed    Call MD for:  severe uncontrolled pain    Complete by:  As directed    Diet - low sodium heart healthy    Complete by:  As directed    Increase activity slowly    Complete by:  As directed      Allergies as of 01/16/2017   No Known Allergies     Medication List    STOP taking these medications   LYRICA 150 MG capsule Generic drug:  pregabalin     TAKE these medications   cyclobenzaprine 10 MG tablet Commonly known as:  FLEXERIL Take 1 tablet (10 mg total) by mouth 3 (three) times daily as needed.   doxycycline 100 MG tablet Commonly known as:  ADOXA Take 1 tablet (100 mg total) by mouth 2 (two) times daily.   FLOMAX 0.4 MG Caps capsule Generic drug:  tamsulosin Take 0.4 mg by mouth daily.   gabapentin 600 MG tablet Commonly known as:  NEURONTIN Take 1 tablet by mouth 3 (three) times daily.   guaiFENesin 600 MG 12 hr tablet Commonly known as:  MUCINEX Take 2 tablets (1,200 mg total) by mouth 2 (two) times daily.   lidocaine 5 % ointment Commonly known as:  XYLOCAINE Apply 1 application topically daily.   lisinopril 40 MG tablet Commonly known as:  PRINIVIL,ZESTRIL Take 40 mg by mouth daily.   omeprazole 20 MG capsule Commonly known as:  PRILOSEC Take 20 mg by mouth daily.   ondansetron 4 MG disintegrating tablet Commonly known as:  ZOFRAN ODT Take 1 tablet (4 mg total) by mouth every 8 (eight) hours as needed for nausea or vomiting.   oxybutynin 5 MG tablet Commonly known as:  DITROPAN Take 5-15 mg by mouth 2 (two) times daily.   Oxycodone HCl 10 MG Tabs Take 10 mg by mouth every 6 (six) hours as needed (Pain).   polyethylene glycol packet Commonly known as:  MIRALAX / GLYCOLAX Take 17 g by mouth 2 (two) times daily.   topiramate 50 MG tablet Commonly known as:  TOPAMAX Take 1 tablet by  mouth 2 (two) times daily.   VENTOLIN HFA 108 (90 Base) MCG/ACT inhaler Generic drug:  albuterol Inhale 2 puffs into the lungs every 4 (four) hours as needed.   verapamil 240 MG CR tablet Commonly known as:  CALAN-SR Take 240 mg by mouth daily.   ZETIA 10 MG tablet Generic drug:  ezetimibe Take 10 mg by mouth daily.      Follow-up Information    [facility] Schedule an appointment as soon as possible for a visit in 1 week(s).   Why:  Hospital follow Up Contact information: [address] [place] [address] [phone]          No Known Allergies Current Discharge Medication List    START taking these medications   Details  doxycycline (ADOXA) 100 MG tablet Take 1 tablet (100 mg total) by mouth 2 (two) times daily. Qty: 10 tablet, Refills: 0    guaiFENesin (MUCINEX)  600 MG 12 hr tablet Take 2 tablets (1,200 mg total) by mouth 2 (two) times daily. Qty: 20 tablet, Refills: 0    polyethylene glycol (MIRALAX / GLYCOLAX) packet Take 17 g by mouth 2 (two) times daily. Qty: 14 each, Refills: 0      CONTINUE these medications which have NOT CHANGED   Details  cyclobenzaprine (FLEXERIL) 10 MG tablet Take 1 tablet (10 mg total) by mouth 3 (three) times daily as needed. Qty: 21 tablet, Refills: 0    ezetimibe (ZETIA) 10 MG tablet Take 10 mg by mouth daily.    gabapentin (NEURONTIN) 600 MG tablet Take 1 tablet by mouth 3 (three) times daily.    lidocaine (XYLOCAINE) 5 % ointment Apply 1 application topically daily.    lisinopril (PRINIVIL,ZESTRIL) 40 MG tablet Take 40 mg by mouth daily.    omeprazole (PRILOSEC) 20 MG capsule Take 20 mg by mouth daily.    oxybutynin (DITROPAN) 5 MG tablet Take 5-15 mg by mouth 2 (two) times daily.    Oxycodone HCl 10 MG TABS Take 10 mg by mouth every 6 (six) hours as needed (Pain).    tamsulosin (FLOMAX) 0.4 MG CAPS capsule Take 0.4 mg by mouth daily.    topiramate (TOPAMAX) 50 MG tablet Take 1 tablet by  mouth 2 (two) times daily.    VENTOLIN HFA 108 (90 BASE) MCG/ACT inhaler Inhale 2 puffs into the lungs every 4 (four) hours as needed.    verapamil (CALAN-SR) 240 MG CR tablet Take 240 mg by mouth daily.    ondansetron (ZOFRAN ODT) 4 MG disintegrating tablet Take 1 tablet (4 mg total) by mouth every 8 (eight) hours as needed for nausea or vomiting. Qty: 20 tablet, Refills: 0      STOP taking these medications     LYRICA 150 MG capsule        Procedures/Studies: Dg Chest 2 View  Result Date: 01/14/2017 CLINICAL DATA:  Cough and shortness of breath.  Subjective fever EXAM: CHEST  2 VIEW COMPARISON:  03/21/2015 FINDINGS: Questionable reticulonodular opacity in the peripheral left mid lung. No edema, effusion, or pneumothorax. Chronic elevation of the right diaphragm, likely eventration. Normal heart size. IMPRESSION: Subtly increased markings on the left, question developing pneumonia in this setting. Electronically Signed   By: Monte Fantasia M.D.   On: 01/14/2017 13:04   Dg Chest Port 1 View  Result Date: 01/15/2017 CLINICAL DATA:  Pneumonia EXAM: PORTABLE CHEST 1 VIEW COMPARISON:  January 14, 2017 FINDINGS: Focal opacity in the left mid lung noted 1 day prior has resolved. Currently there is no edema or consolidation. Heart size and pulmonary vascularity are normal. No adenopathy. No appreciable bone lesions. IMPRESSION: Currently no edema or consolidation. Resolution of opacity left mid lung compared to 1 day prior. Electronically Signed   By: Lowella Grip III M.D.   On: 01/15/2017 07:28      Subjective: Pt says that he feels great.  He would like to go home as soon as possible.    Discharge Exam: Vitals:   01/16/17 0000 01/16/17 0700  BP: 101/85 (!) 120/56  Pulse: 90 85  Resp: (!) 21 18  Temp: 99 F (37.2 C) 98 F (36.7 C)  SpO2: 94% 97%   Vitals:   01/15/17 0945 01/15/17 1250 01/16/17 0000 01/16/17 0700  BP:  (!) 127/58 101/85 (!) 120/56  Pulse:  78 90 85   Resp:  20 (!) 21 18  Temp:  98.6 F (37 C) 99 F (  37.2 C) 98 F (36.7 C)  TempSrc:  Oral Oral   SpO2: 92% 96% 94% 97%  Weight:      Height:       General exam: awake, alert, NAD. Cooperative.   Respiratory system:  No increased work of breathing. Cardiovascular system: S1 & S2 heard, RRR. No JVD, murmurs, gallops, clicks or pedal edema. Gastrointestinal system: Abdomen is nondistended, soft and nontender. Normal bowel sounds heard. Central nervous system: Alert and oriented. No focal neurological deficits. Extremities: no CCE.   The results of significant diagnostics from this hospitalization (including imaging, microbiology, ancillary and laboratory) are listed below for reference.     Microbiology: Recent Results (from the past 240 hour(s))  Blood Culture (routine x 2)     Status: None (Preliminary result)   Collection Time: 01/14/17  1:19 PM  Result Value Ref Range Status   Specimen Description RIGHT ANTECUBITAL  Final   Special Requests   Final    BOTTLES DRAWN AEROBIC AND ANAEROBIC Blood Culture adequate volume   Culture NO GROWTH 2 DAYS  Final   Report Status PENDING  Incomplete  Blood Culture (routine x 2)     Status: None (Preliminary result)   Collection Time: 01/14/17  1:20 PM  Result Value Ref Range Status   Specimen Description RIGHT ANTECUBITAL  Final   Special Requests   Final    BOTTLES DRAWN AEROBIC AND ANAEROBIC Blood Culture adequate volume   Culture NO GROWTH 2 DAYS  Final   Report Status PENDING  Incomplete  Respiratory Panel by PCR     Status: None   Collection Time: 01/14/17  6:33 PM  Result Value Ref Range Status   Adenovirus NOT DETECTED NOT DETECTED Final   Coronavirus 229E NOT DETECTED NOT DETECTED Final   Coronavirus HKU1 NOT DETECTED NOT DETECTED Final   Coronavirus NL63 NOT DETECTED NOT DETECTED Final   Coronavirus OC43 NOT DETECTED NOT DETECTED Final   Metapneumovirus NOT DETECTED NOT DETECTED Final   Rhinovirus / Enterovirus NOT DETECTED  NOT DETECTED Final   Influenza A NOT DETECTED NOT DETECTED Final   Influenza B NOT DETECTED NOT DETECTED Final   Parainfluenza Virus 1 NOT DETECTED NOT DETECTED Final   Parainfluenza Virus 2 NOT DETECTED NOT DETECTED Final   Parainfluenza Virus 3 NOT DETECTED NOT DETECTED Final   Parainfluenza Virus 4 NOT DETECTED NOT DETECTED Final   Respiratory Syncytial Virus NOT DETECTED NOT DETECTED Final   Bordetella pertussis NOT DETECTED NOT DETECTED Final   Chlamydophila pneumoniae NOT DETECTED NOT DETECTED Final   Mycoplasma pneumoniae NOT DETECTED NOT DETECTED Final    Comment: Performed at [facility], [place] [address] [place], [place] [address]     Labs: BNP (last 3 results) No results for input(s): BNP in the last 8760 hours. Basic Metabolic Panel:  Recent Labs Lab 01/14/17 1220 01/15/17 0914 01/16/17 0642  NA 131* 135 138  K 4.0 3.8 4.2  CL 97* 101 101  CO2 25 27 26   GLUCOSE 114* 144* 118*  BUN 12 12 10   CREATININE 1.23 1.19 1.22  CALCIUM 8.9 8.5* 9.0  MG  --  2.3 2.2   Liver Function Tests:  Recent Labs Lab 01/14/17 1220 01/15/17 0914 01/16/17 0642  AST 20 16 18   ALT 17 14* 15*  ALKPHOS 102 93 90  BILITOT 1.0 0.6 0.7  PROT 7.9 6.8 6.9  ALBUMIN 3.8 3.1* 3.0*   No results for input(s): LIPASE, AMYLASE in the last 168 hours.  No results for input(s): AMMONIA in the last 168 hours. CBC:  Recent Labs Lab 01/14/17 1220 01/15/17 0914 01/16/17 0642  WBC 17.9* 11.3* 11.0*  NEUTROABS 14.1* 8.3* 7.1  HGB 13.7 12.0* 11.9*  HCT 41.2 36.4* 36.4*  MCV 88.6 91.2 90.3  PLT 211 192 231   Cardiac Enzymes: No results for input(s): CKTOTAL, CKMB, CKMBINDEX, TROPONINI in the last 168 hours. BNP: Invalid input(s): POCBNP CBG: No results for input(s): GLUCAP in the last 168 hours. D-Dimer No results for input(s): DDIMER in the last 72 hours. Hgb A1c No results for input(s): HGBA1C in the last 72 hours. Lipid Profile No results for input(s): CHOL, HDL, LDLCALC,  TRIG, CHOLHDL, LDLDIRECT in the last 72 hours. Thyroid function studies No results for input(s): TSH, T4TOTAL, T3FREE, THYROIDAB in the last 72 hours.  Invalid input(s): FREET3 Anemia work up No results for input(s): VITAMINB12, FOLATE, FERRITIN, TIBC, IRON, RETICCTPCT in the last 72 hours. Urinalysis    Component Value Date/Time   COLORURINE AMBER (A) 01/14/2017 1358   APPEARANCEUR HAZY (A) 01/14/2017 1358   LABSPEC 1.023 01/14/2017 1358   PHURINE 5.0 01/14/2017 1358   GLUCOSEU NEGATIVE 01/14/2017 1358   HGBUR NEGATIVE 01/14/2017 [place] 01/14/2017 1358   KETONESUR NEGATIVE 01/14/2017 1358   PROTEINUR 30 (A) 01/14/2017 1358   NITRITE NEGATIVE 01/14/2017 1358   LEUKOCYTESUR NEGATIVE 01/14/2017 1358   Sepsis Labs Invalid input(s): PROCALCITONIN,  WBC,  LACTICIDVEN Microbiology Recent Results (from the past 240 hour(s))  Blood Culture (routine x 2)     Status: None (Preliminary result)   Collection Time: 01/14/17  1:19 PM  Result Value Ref Range Status   Specimen Description RIGHT ANTECUBITAL  Final   Special Requests   Final    BOTTLES DRAWN AEROBIC AND ANAEROBIC Blood Culture adequate volume   Culture NO GROWTH 2 DAYS  Final   Report Status PENDING  Incomplete  Blood Culture (routine x 2)     Status: None (Preliminary result)   Collection Time: 01/14/17  1:20 PM  Result Value Ref Range Status   Specimen Description RIGHT ANTECUBITAL  Final   Special Requests   Final    BOTTLES DRAWN AEROBIC AND ANAEROBIC Blood Culture adequate volume   Culture NO GROWTH 2 DAYS  Final   Report Status PENDING  Incomplete  Respiratory Panel by PCR     Status: None   Collection Time: 01/14/17  6:33 PM  Result Value Ref Range Status   Adenovirus NOT DETECTED NOT DETECTED Final   Coronavirus 229E NOT DETECTED NOT DETECTED Final   Coronavirus HKU1 NOT DETECTED NOT DETECTED Final   Coronavirus NL63 NOT DETECTED NOT DETECTED Final   Coronavirus OC43 NOT DETECTED NOT  DETECTED Final   Metapneumovirus NOT DETECTED NOT DETECTED Final   Rhinovirus / Enterovirus NOT DETECTED NOT DETECTED Final   Influenza A NOT DETECTED NOT DETECTED Final   Influenza B NOT DETECTED NOT DETECTED Final   Parainfluenza Virus 1 NOT DETECTED NOT DETECTED Final   Parainfluenza Virus 2 NOT DETECTED NOT DETECTED Final   Parainfluenza Virus 3 NOT DETECTED NOT DETECTED Final   Parainfluenza Virus 4 NOT DETECTED NOT DETECTED Final   Respiratory Syncytial Virus NOT DETECTED NOT DETECTED Final   Bordetella pertussis NOT DETECTED NOT DETECTED Final   Chlamydophila pneumoniae NOT DETECTED NOT DETECTED Final   Mycoplasma pneumoniae NOT DETECTED NOT DETECTED Final    Comment: Performed at [facility], [place]. [address], [place], [place] [address]  Time coordinating discharge: 32 mins  SIGNED:  Irwin Brakeman, MD  [facility] 01/16/2017, 11:25 AM Pager [phone]  If 7PM-7AM, please contact night-coverage [email or web address] Password [email or web address]

## 2017-01-16 NOTE — Discharge Instructions (Signed)
Follow with Primary MD  [facility]  and other consultant's as instructed your Hospitalist MD  Please get a complete blood count and chemistry panel checked by your Primary MD at your next visit, and again as instructed by your Primary MD.  Get Medicines reviewed and adjusted: Please take all your medications with you for your next visit with your Primary MD  Laboratory/radiological data: Please request your Primary MD to go over all hospital tests and procedure/radiological results at the follow up, please ask your Primary MD to get all Hospital records sent to his/her office.  In some cases, they will be blood work, cultures and biopsy results pending at the time of your discharge. Please request that your primary care M.D. follows up on these results.  Also Note the following: If you experience worsening of your admission symptoms, develop shortness of breath, life threatening emergency, suicidal or homicidal thoughts you must seek medical attention immediately by calling 911 or calling your MD immediately  if symptoms less severe.  You must read complete instructions/literature along with all the possible adverse reactions/side effects for all the Medicines you take and that have been prescribed to you. Take any new Medicines after you have completely understood and accpet all the possible adverse reactions/side effects.   Do not drive when taking Pain medications or sleeping medications (Benzodaizepines)  Do not take more than prescribed Pain, Sleep and Anxiety Medications. It is not advisable to combine anxiety,sleep and pain medications without talking with your primary care practitioner  Special Instructions: If you have smoked or chewed Tobacco  in the last 2 yrs please stop smoking, stop any regular Alcohol  and or any Recreational drug use.  Wear Seat belts while driving.  Please note: You were cared for by a hospitalist during your hospital stay. Once  you are discharged, your primary care physician will handle any further medical issues. Please note that NO REFILLS for any discharge medications will be authorized once you are discharged, as it is imperative that you return to your primary care physician (or establish a relationship with a primary care physician if you do not have one) for your post hospital discharge needs so that they can reassess your need for medications and monitor your lab values.

## 2017-01-16 NOTE — Care Management (Signed)
Admitted with pneumonia. Chart reviewed for CM needs. Pt from home, lives with family, ind with ADL's. Has PCP, transportation and insurance with drug coverage. No CM needs noted at DC.

## 2017-01-16 NOTE — Progress Notes (Signed)
Pt discharged.  AVS discussed with patient.  Pt voiced understanding.  Pt wheeled down by tech.  Pt stable upon discharge.

## 2017-01-16 NOTE — Care Management Important Message (Signed)
Important Message  Patient Details  Name: Willie Fowler MRN: [id number] Date of Birth: 07-Feb-1951   Medicare Important Message Given:  Yes    Sherald Barge, RN 01/16/2017, 11:29 AM

## 2017-01-19 LAB — CULTURE, BLOOD (ROUTINE X 2)
CULTURE: NO GROWTH
Culture: NO GROWTH
SPECIAL REQUESTS: ADEQUATE
Special Requests: ADEQUATE

## 2017-03-08 ENCOUNTER — Ambulatory Visit
Admission: RE | Admit: 2017-03-08 | Discharge: 2017-03-08 | Disposition: A | Discharge status: Home / Self Care | Payer: MEDICARE | Admitting: Urology

## 2017-03-08 DIAGNOSIS — C7951 Secondary malignant neoplasm of bone: Secondary | ICD-10-CM

## 2017-03-08 DIAGNOSIS — C61 Malignant neoplasm of prostate: Principal | ICD-10-CM

## 2017-03-08 DIAGNOSIS — Z131 Encounter for screening for diabetes mellitus: Secondary | ICD-10-CM

## 2017-03-08 MED ORDER — TAMSULOSIN 0.4 MG CAPSULE
ORAL_CAPSULE | Freq: Every day | ORAL | 11 refills | 0 days | Status: CP
Start: 2017-03-08 — End: ?

## 2017-03-15 DIAGNOSIS — N529 Male erectile dysfunction, unspecified: Principal | ICD-10-CM

## 2017-03-16 ENCOUNTER — Ambulatory Visit
Admission: RE | Admit: 2017-03-16 | Discharge: 2017-03-16 | Disposition: A | Discharge status: Home / Self Care | Payer: MEDICARE | Admitting: Urology

## 2017-03-16 ENCOUNTER — Ambulatory Visit
Admission: RE | Admit: 2017-03-16 | Discharge: 2017-03-16 | Disposition: A | Discharge status: Home / Self Care | Payer: MEDICARE

## 2017-03-16 DIAGNOSIS — N529 Male erectile dysfunction, unspecified: Principal | ICD-10-CM

## 2017-03-16 MED ORDER — SENNOSIDES 8.6 MG TABLET
ORAL_TABLET | Freq: Every day | ORAL | 0 refills | 0 days | Status: CP
Start: 2017-03-16 — End: 2017-04-15

## 2017-03-16 MED ORDER — ACETAMINOPHEN 500 MG TABLET
ORAL_TABLET | Freq: Four times a day (QID) | ORAL | 0 refills | 0.00000 days | Status: CP | PRN
Start: 2017-03-16 — End: 2017-03-23

## 2017-03-16 MED ORDER — CELECOXIB 200 MG CAPSULE
ORAL_CAPSULE | Freq: Two times a day (BID) | ORAL | 0 refills | 0.00000 days | Status: CP
Start: 2017-03-16 — End: 2017-03-30

## 2017-03-16 MED ORDER — DOCUSATE SODIUM 100 MG CAPSULE
ORAL_CAPSULE | Freq: Two times a day (BID) | ORAL | 0 refills | 0 days | Status: CP
Start: 2017-03-16 — End: 2017-04-15

## 2017-03-22 ENCOUNTER — Ambulatory Visit
Admission: RE | Admit: 2017-03-22 | Discharge: 2017-03-22 | Disposition: A | Discharge status: Home / Self Care | Payer: MEDICARE

## 2017-03-22 ENCOUNTER — Ambulatory Visit
Admission: RE | Admit: 2017-03-22 | Discharge: 2017-03-22 | Disposition: A | Discharge status: Home / Self Care | Payer: MEDICARE | Attending: Medical Oncology | Admitting: Medical Oncology

## 2017-03-22 DIAGNOSIS — C61 Malignant neoplasm of prostate: Principal | ICD-10-CM

## 2017-03-22 DIAGNOSIS — C7951 Secondary malignant neoplasm of bone: Secondary | ICD-10-CM

## 2017-04-05 ENCOUNTER — Encounter: Admit: 2017-04-05 | Discharge: 2017-04-06 | Payer: MEDICARE | Attending: Urology | Primary: Urology

## 2017-04-05 DIAGNOSIS — N5202 Corporo-venous occlusive erectile dysfunction: Principal | ICD-10-CM

## 2017-04-05 MED ORDER — AMOXICILLIN 875 MG-POTASSIUM CLAVULANATE 125 MG TABLET
ORAL_TABLET | Freq: Two times a day (BID) | ORAL | 0 refills | 0 days | Status: SS
Start: 2017-04-05 — End: 2017-04-20

## 2017-04-05 MED ORDER — OXYCODONE 5 MG TABLET
ORAL_TABLET | ORAL | 0 refills | 0.00000 days | Status: CP | PRN
Start: 2017-04-05 — End: 2017-04-17

## 2017-04-17 MED ORDER — OXYCODONE 5 MG TABLET
ORAL_TABLET | ORAL | 0 refills | 0 days | Status: CP | PRN
Start: 2017-04-17 — End: 2017-04-26

## 2017-04-19 ENCOUNTER — Encounter: Admit: 2017-04-19 | Discharge: 2017-04-20 | Payer: MEDICARE | Attending: Urology | Primary: Urology

## 2017-04-19 DIAGNOSIS — T8361XA Infection and inflammatory reaction due to implanted penile prosthesis, initial encounter: Principal | ICD-10-CM

## 2017-04-20 ENCOUNTER — Encounter
Admit: 2017-04-20 | Discharge: 2017-04-20 | Payer: MEDICARE | Attending: Certified Registered" | Primary: Certified Registered"

## 2017-04-20 ENCOUNTER — Ambulatory Visit: Admit: 2017-04-20 | Discharge: 2017-04-20 | Payer: MEDICARE

## 2017-04-20 DIAGNOSIS — T8369XA Infection and inflammatory reaction due to other prosthetic device, implant and graft in genital tract, initial encounter: Principal | ICD-10-CM

## 2017-04-20 MED ORDER — AMOXICILLIN 875 MG-POTASSIUM CLAVULANATE 125 MG TABLET
ORAL_TABLET | Freq: Two times a day (BID) | ORAL | 0 refills | 0.00000 days | Status: CP
Start: 2017-04-20 — End: 2017-04-20

## 2017-04-20 MED ORDER — OXYCODONE 5 MG TABLET
ORAL_TABLET | ORAL | 0 refills | 0.00000 days | Status: CP | PRN
Start: 2017-04-20 — End: 2017-04-25

## 2017-04-20 MED ORDER — AMOXICILLIN 875 MG-POTASSIUM CLAVULANATE 125 MG TABLET: 1 | tablet | Freq: Two times a day (BID) | 0 refills | 0 days | Status: AC

## 2017-04-20 MED ORDER — CELECOXIB 200 MG CAPSULE: 200 mg | capsule | Freq: Two times a day (BID) | 0 refills | 0 days | Status: AC

## 2017-04-20 MED ORDER — CELECOXIB 200 MG CAPSULE
ORAL_CAPSULE | Freq: Two times a day (BID) | ORAL | 0 refills | 0.00000 days | Status: CP
Start: 2017-04-20 — End: 2018-04-20

## 2017-04-20 MED FILL — AMOXICILLIN-CLAVULANATE/875-125MG/TABS: AMOXICILLIN-CLAVULANATE/875-125MG/TABS | 14 days supply | Qty: 28 | Fill #0

## 2017-04-20 MED FILL — CELECOXIB/200MG/CAPS: CELECOXIB/200MG/CAPS | 7 days supply | Qty: 14 | Fill #0

## 2017-04-26 MED ORDER — OXYCODONE 5 MG TABLET
ORAL_TABLET | ORAL | 0 refills | 0.00000 days | Status: CP | PRN
Start: 2017-04-26 — End: 2017-05-15

## 2017-05-10 ENCOUNTER — Encounter: Admit: 2017-05-10 | Discharge: 2017-05-11 | Payer: MEDICARE | Attending: Urology | Primary: Urology

## 2017-05-10 DIAGNOSIS — T8361XA Infection and inflammatory reaction due to implanted penile prosthesis, initial encounter: Principal | ICD-10-CM

## 2017-05-10 MED ORDER — AMOXICILLIN 875 MG-POTASSIUM CLAVULANATE 125 MG TABLET
ORAL_TABLET | Freq: Two times a day (BID) | ORAL | 0 refills | 0 days | Status: CP
Start: 2017-05-10 — End: 2017-05-24

## 2017-05-15 MED ORDER — OXYCODONE 5 MG TABLET
ORAL_TABLET | ORAL | 0 refills | 0 days | Status: CP | PRN
Start: 2017-05-15 — End: 2017-05-24

## 2017-05-24 ENCOUNTER — Encounter: Admit: 2017-05-24 | Discharge: 2017-05-25 | Payer: MEDICARE | Attending: Urology | Primary: Urology

## 2017-05-24 DIAGNOSIS — T8361XA Infection and inflammatory reaction due to implanted penile prosthesis, initial encounter: Principal | ICD-10-CM

## 2017-05-24 MED ORDER — OXYCODONE 5 MG TABLET
ORAL_TABLET | ORAL | 0 refills | 0.00000 days | Status: CP | PRN
Start: 2017-05-24 — End: ?

## 2017-06-25 ENCOUNTER — Other Ambulatory Visit (HOSPITAL_COMMUNITY): Payer: Self-pay | Admitting: Internal Medicine

## 2017-06-25 DIAGNOSIS — M79605 Pain in left leg: Secondary | ICD-10-CM

## 2017-06-27 ENCOUNTER — Ambulatory Visit (HOSPITAL_COMMUNITY): Payer: Medicare Other

## 2017-06-27 ENCOUNTER — Encounter (HOSPITAL_COMMUNITY): Payer: Self-pay

## 2017-06-27 ENCOUNTER — Ambulatory Visit (HOSPITAL_COMMUNITY): Payer: Medicaid Other

## 2017-06-27 ENCOUNTER — Ambulatory Visit (HOSPITAL_COMMUNITY)
Admission: RE | Admit: 2017-06-27 | Discharge: 2017-06-27 | Disposition: A | Discharge status: Home / Self Care | Payer: Medicare Other | Source: Ambulatory Visit | Attending: Internal Medicine | Admitting: Internal Medicine

## 2017-07-05 ENCOUNTER — Encounter: Admit: 2017-07-05 | Discharge: 2017-07-05 | Payer: MEDICARE | Attending: Medical Oncology | Primary: Medical Oncology

## 2017-07-05 ENCOUNTER — Encounter: Admit: 2017-07-05 | Discharge: 2017-07-05 | Payer: MEDICARE

## 2017-07-05 DIAGNOSIS — C61 Malignant neoplasm of prostate: Principal | ICD-10-CM

## 2017-07-05 DIAGNOSIS — C7951 Secondary malignant neoplasm of bone: Secondary | ICD-10-CM

## 2017-07-10 ENCOUNTER — Ambulatory Visit: Admit: 2017-07-10 | Discharge: 2017-07-11 | Payer: MEDICARE

## 2017-07-10 DIAGNOSIS — N529 Male erectile dysfunction, unspecified: Principal | ICD-10-CM

## 2017-08-30 ENCOUNTER — Ambulatory Visit (INDEPENDENT_AMBULATORY_CARE_PROVIDER_SITE_OTHER): Payer: Medicare Other

## 2017-08-30 ENCOUNTER — Encounter: Payer: Self-pay | Admitting: Orthopaedic Surgery

## 2017-08-30 ENCOUNTER — Ambulatory Visit (INDEPENDENT_AMBULATORY_CARE_PROVIDER_SITE_OTHER): Payer: Medicare Other | Admitting: Orthopaedic Surgery

## 2017-08-30 VITALS — BP 147/58 | HR 68 | Temp 97.1°F | Ht 67.0 in | Wt 199.0 lb

## 2017-08-30 DIAGNOSIS — M25512 Pain in left shoulder: Secondary | ICD-10-CM | POA: Diagnosis not present

## 2017-08-30 DIAGNOSIS — G8929 Other chronic pain: Secondary | ICD-10-CM | POA: Diagnosis not present

## 2017-08-30 NOTE — Progress Notes (Signed)
Subjective:    Patient ID: Willie Fowler, male    DOB: [birth year], 67 y.o.   MRN: [id number]  HPI He has had pain in the left shoulder for about three months.  He has developed a "knot" anteriorly.  He has had decreasing motion.  He has no numbness, no trauma, no redness.  He has seen his doctor in [place] and had x-rays.  However, no report or CD of the x-rays were sent.  I have read the last office note.  He says the knot has gotten bigger and his pain worse.  Advil has helped as has ice or heat.   Review of Systems  Respiratory: Negative for cough and shortness of breath.   Cardiovascular: Negative for chest pain and leg swelling.  Musculoskeletal: Positive for arthralgias, back pain and joint swelling.   Past Medical History:  Diagnosis Date  . Cancer ([place])   . Chronic ankle pain   . Chronic back pain   . Chronic left shoulder pain   . GERD (gastroesophageal reflux disease)   . High cholesterol   . History of alcohol abuse   . Hypertension   . Lumbar radiculopathy   . Pain management    [facility]    Past Surgical History:  Procedure Laterality Date  . BACK SURGERY    . TONSILLECTOMY      Current Outpatient Medications on File Prior to Visit  Medication Sig Dispense Refill  . etodolac (LODINE) 400 MG tablet Take 400 mg by mouth 2 (two) times daily.    Marland Kitchen ezetimibe (ZETIA) 10 MG tablet Take 10 mg by mouth daily.    Marland Kitchen gabapentin (NEURONTIN) 600 MG tablet Take 1 tablet by mouth 3 (three) times daily.    Marland Kitchen lisinopril (PRINIVIL,ZESTRIL) 40 MG tablet Take 40 mg by mouth daily.    Marland Kitchen omeprazole (PRILOSEC) 20 MG capsule Take 20 mg by mouth daily.    Marland Kitchen oxybutynin (DITROPAN) 5 MG tablet Take 5-15 mg by mouth 2 (two) times daily.    . pregabalin (LYRICA) 150 MG capsule Take 150 mg by mouth 2 (two) times daily.    Marland Kitchen senna (SENOKOT) 8.6 MG tablet Take 1 tablet by mouth daily.    . tamsulosin (FLOMAX) 0.4 MG CAPS capsule Take 0.4 mg by mouth daily.    . traMADol (ULTRAM) 50 MG tablet  Take by mouth every 6 (six) hours as needed.    . VENTOLIN HFA 108 (90 BASE) MCG/ACT inhaler Inhale 2 puffs into the lungs every 4 (four) hours as needed.    . verapamil (CALAN-SR) 240 MG CR tablet Take 240 mg by mouth daily.    . cyclobenzaprine (FLEXERIL) 10 MG tablet Take 1 tablet (10 mg total) by mouth 3 (three) times daily as needed. (Patient not taking: Reported on 08/30/2017) 21 tablet 0  . lidocaine (XYLOCAINE) 5 % ointment Apply 1 application topically daily.    . ondansetron (ZOFRAN ODT) 4 MG disintegrating tablet Take 1 tablet (4 mg total) by mouth every 8 (eight) hours as needed for nausea or vomiting. (Patient not taking: Reported on 08/30/2017) 20 tablet 0  . Oxycodone HCl 10 MG TABS Take 10 mg by mouth every 6 (six) hours as needed (Pain).    . topiramate (TOPAMAX) 50 MG tablet Take 1 tablet by mouth 2 (two) times daily.     No current facility-administered medications on file prior to visit.     Social History   Socioeconomic History  . Marital status: Divorced  Spouse name: Not on file  . Number of children: Not on file  . Years of education: Not on file  . Highest education level: Not on file  Occupational History  . Not on file  Social Needs  . Financial resource strain: Not on file  . Food insecurity:    Worry: Not on file    Inability: Not on file  . Transportation needs:    Medical: Not on file    Non-medical: Not on file  Tobacco Use  . Smoking status: Current Every Day Smoker    Packs/day: 0.50    Years: 40.00    Pack years: 20.00    Types: Cigarettes  . Smokeless tobacco: Never Used  Substance and Sexual Activity  . Alcohol use: Yes    Comment: weekly  . Drug use: No  . Sexual activity: Not on file  Lifestyle  . Physical activity:    Days per week: Not on file    Minutes per session: Not on file  . Stress: Not on file  Relationships  . Social connections:    Talks on phone: Not on file    Gets together: Not on file    Attends religious  service: Not on file    Active member of club or organization: Not on file    Attends meetings of clubs or organizations: Not on file    Relationship status: Not on file  . Intimate partner violence:    Fear of current or ex partner: Not on file    Emotionally abused: Not on file    Physically abused: Not on file    Forced sexual activity: Not on file  Other Topics Concern  . Not on file  Social History Narrative  . Not on file    Family History  Problem Relation Age of Onset  . Cancer Mother        breast  . Cancer Sister        breast    BP (!) 147/58   Pulse 68   Temp (!) 97.1 F (36.2 C)   Ht 5\' 7"  (1.702 m)   Wt 199 lb (90.3 kg)   BMI 31.17 kg/m       Objective:   Physical Exam  Constitutional: He is oriented to person, place, and time. He appears well-developed and well-nourished.  HENT:  Head: Normocephalic and atraumatic.  Eyes: Pupils are equal, round, and reactive to light. Conjunctivae and EOM are normal.  Neck: Normal range of motion. Neck supple.  Cardiovascular: Normal rate, regular rhythm and intact distal pulses.  Pulmonary/Chest: Effort normal.  Abdominal: Soft.  Musculoskeletal:       Left shoulder: He exhibits decreased range of motion, tenderness, swelling and pain.       Arms: Neurological: He is alert and oriented to person, place, and time. He has normal reflexes. He displays normal reflexes. No cranial nerve deficit. He exhibits normal muscle tone. Coordination normal.  Skin: Skin is warm and dry.  Psychiatric: He has a normal mood and affect. His behavior is normal. Judgment and thought content normal.     X-rays of the left shoulder were done, reported separately.     Assessment & Plan:   Encounter Diagnosis  Name Primary?  . Pain in joint of left shoulder Yes   Procedure note: After permission from the patient, the left shoulder was prepped.  I attempted to aspirate the cystic lesion of the anterior portion of the left shoulder  and was  unsuccessful in getting any fluid.  It is either very viscous or solid.    I have recommended a MRI of the left shoulder.  We will schedule this.  Return after the MRI.  Call if any problem.  Precautions discussed.   Electronically Signed Sanjuana Kava, MD 5/30/20199:00 AM

## 2017-08-30 NOTE — Patient Instructions (Addendum)
Steps to Quit Smoking Smoking tobacco can be bad for your health. It can also affect almost every organ in your body. Smoking puts you and people around you at risk for many serious Alexy Heldt-lasting (chronic) diseases. Quitting smoking is hard, but it is one of the best things that you can do for your health. It is never too late to quit. What are the benefits of quitting smoking? When you quit smoking, you lower your risk for getting serious diseases and conditions. They can include:  Lung cancer or lung disease.  Heart disease.  Stroke.  Heart attack.  Not being able to have children (infertility).  Weak bones (osteoporosis) and broken bones (fractures).  If you have coughing, wheezing, and shortness of breath, those symptoms may get better when you quit. You may also get sick less often. If you are pregnant, quitting smoking can help to lower your chances of having a baby of low birth weight. What can I do to help me quit smoking? Talk with your doctor about what can help you quit smoking. Some things you can do (strategies) include:  Quitting smoking totally, instead of slowly cutting back how much you smoke over a period of time.  Going to in-person counseling. You are more likely to quit if you go to many counseling sessions.  Using resources and support systems, such as: ? Online chats with a counselor. ? Phone quitlines. ? Printed self-help materials. ? Support groups or group counseling. ? Text messaging programs. ? Mobile phone apps or applications.  Taking medicines. Some of these medicines may have nicotine in them. If you are pregnant or breastfeeding, do not take any medicines to quit smoking unless your doctor says it is okay. Talk with your doctor about counseling or other things that can help you.  Talk with your doctor about using more than one strategy at the same time, such as taking medicines while you are also going to in-person counseling. This can help make  quitting easier. What things can I do to make it easier to quit? Quitting smoking might feel very hard at first, but there is a lot that you can do to make it easier. Take these steps:  Talk to your family and friends. Ask them to support and encourage you.  Call phone quitlines, reach out to support groups, or work with a counselor.  Ask people who smoke to not smoke around you.  Avoid places that make you want (trigger) to smoke, such as: ? Bars. ? Parties. ? Smoke-break areas at work.  Spend time with people who do not smoke.  Lower the stress in your life. Stress can make you want to smoke. Try these things to help your stress: ? Getting regular exercise. ? Deep-breathing exercises. ? Yoga. ? Meditating. ? Doing a body scan. To do this, close your eyes, focus on one area of your body at a time from head to toe, and notice which parts of your body are tense. Try to relax the muscles in those areas.  Download or buy apps on your mobile phone or tablet that can help you stick to your quit plan. There are many free apps, such as QuitGuide from the CDC (Centers for Disease Control and Prevention). You can find more support from smokefree.gov and other websites.  This information is not intended to replace advice given to you by your health care provider. Make sure you discuss any questions you have with your health care provider. Document Released: 01/14/2009 Document   Revised: 11/16/2015 Document Reviewed: 08/04/2014 Elsevier Interactive Patient Education  2018 [employer].  Shoulder Exercises Ask your health care provider which exercises are safe for you. Do exercises exactly as told by your health care provider and adjust them as directed. It is normal to feel mild stretching, pulling, tightness, or discomfort as you do these exercises, but you should stop right away if you feel sudden pain or your pain gets worse.Do not begin these exercises until told by your health care  provider. RANGE OF MOTION EXERCISES These exercises warm up your muscles and joints and improve the movement and flexibility of your shoulder. These exercises also help to relieve pain, numbness, and tingling. These exercises involve stretching your injured shoulder directly. Exercise A: Pendulum  1. Stand near a wall or a surface that you can hold onto for balance. 2. Bend at the waist and let your left / right arm hang straight down. Use your other arm to support you. Keep your back straight and do not lock your knees. 3. Relax your left / right arm and shoulder muscles, and move your hips and your trunk so your left / right arm swings freely. Your arm should swing because of the motion of your body, not because you are using your arm or shoulder muscles. 4. Keep moving your body so your arm swings in the following directions, as told by your health care provider: ? Side to side. ? Forward and backward. ? In clockwise and counterclockwise circles. 5. Continue each motion for __________ seconds, or for as Revel Stellmach as told by your health care provider. 6. Slowly return to the starting position. Repeat __________ times. Complete this exercise __________ times a day. Exercise B:Flexion, Standing  1. Stand and hold a broomstick, a cane, or a similar object. Place your hands a little more than shoulder-width apart on the object. Your left / right hand should be palm-up, and your other hand should be palm-down. 2. Keep your elbow straight and keep your shoulder muscles relaxed. Push the stick down with your healthy arm to raise your left / right arm in front of your body, and then over your head until you feel a stretch in your shoulder. ? Avoid shrugging your shoulder while you raise your arm. Keep your shoulder blade tucked down toward the middle of your back. 3. Hold for __________ seconds. 4. Slowly return to the starting position. Repeat __________ times. Complete this exercise __________ times a  day. Exercise C: Abduction, Standing 1. Stand and hold a broomstick, a cane, or a similar object. Place your hands a little more than shoulder-width apart on the object. Your left / right hand should be palm-up, and your other hand should be palm-down. 2. While keeping your elbow straight and your shoulder muscles relaxed, push the stick across your body toward your left / right side. Raise your left / right arm to the side of your body and then over your head until you feel a stretch in your shoulder. ? Do not raise your arm above shoulder height, unless your health care provider tells you to do that. ? Avoid shrugging your shoulder while you raise your arm. Keep your shoulder blade tucked down toward the middle of your back. 3. Hold for __________ seconds. 4. Slowly return to the starting position. Repeat __________ times. Complete this exercise __________ times a day. Exercise D:Internal Rotation  1. Place your left / right hand behind your back, palm-up. 2. Use your other hand to dangle an  exercise band, a towel, or a similar object over your shoulder. Grasp the band with your left / right hand so you are holding onto both ends. 3. Gently pull up on the band until you feel a stretch in the front of your left / right shoulder. ? Avoid shrugging your shoulder while you raise your arm. Keep your shoulder blade tucked down toward the middle of your back. 4. Hold for __________ seconds. 5. Release the stretch by letting go of the band and lowering your hands. Repeat __________ times. Complete this exercise __________ times a day. STRETCHING EXERCISES These exercises warm up your muscles and joints and improve the movement and flexibility of your shoulder. These exercises also help to relieve pain, numbness, and tingling. These exercises are done using your healthy shoulder to help stretch the muscles of your injured shoulder. Exercise E: Warehouse manager (External Rotation and  Abduction)  1. Stand in a doorway with one of your feet slightly in front of the other. This is called a staggered stance. If you cannot reach your forearms to the door frame, stand facing a corner of a room. 2. Choose one of the following positions as told by your health care provider: ? Place your hands and forearms on the door frame above your head. ? Place your hands and forearms on the door frame at the height of your head. ? Place your hands on the door frame at the height of your elbows. 3. Slowly move your weight onto your front foot until you feel a stretch across your chest and in the front of your shoulders. Keep your head and chest upright and keep your abdominal muscles tight. 4. Hold for __________ seconds. 5. To release the stretch, shift your weight to your back foot. Repeat __________ times. Complete this stretch __________ times a day. Exercise F:Extension, Standing 1. Stand and hold a broomstick, a cane, or a similar object behind your back. ? Your hands should be a little wider than shoulder-width apart. ? Your palms should face away from your back. 2. Keeping your elbows straight and keeping your shoulder muscles relaxed, move the stick away from your body until you feel a stretch in your shoulder. ? Avoid shrugging your shoulders while you move the stick. Keep your shoulder blade tucked down toward the middle of your back. 3. Hold for __________ seconds. 4. Slowly return to the starting position. Repeat __________ times. Complete this exercise __________ times a day. STRENGTHENING EXERCISES These exercises build strength and endurance in your shoulder. Endurance is the ability to use your muscles for a Fritz Cauthon time, even after they get tired. Exercise G:External Rotation  1. Sit in a stable chair without armrests. 2. Secure an exercise band at elbow height on your left / right side. 3. Place a soft object, such as a folded towel or a small pillow, between your left /  right upper arm and your body to move your elbow a few inches away (about 10 cm) from your side. 4. Hold the end of the band so it is tight and there is no slack. 5. Keeping your elbow pressed against the soft object, move your left / right forearm out, away from your abdomen. Keep your body steady so only your forearm moves. 6. Hold for __________ seconds. 7. Slowly return to the starting position. Repeat __________ times. Complete this exercise __________ times a day. Exercise H:Shoulder Abduction  1. Sit in a stable chair without armrests, or stand. 2. Hold a __________  weight in your left / right hand, or hold an exercise band with both hands. 3. Start with your arms straight down and your left / right palm facing in, toward your body. 4. Slowly lift your left / right hand out to your side. Do not lift your hand above shoulder height unless your health care provider tells you that this is safe. ? Keep your arms straight. ? Avoid shrugging your shoulder while you do this movement. Keep your shoulder blade tucked down toward the middle of your back. 5. Hold for __________ seconds. 6. Slowly lower your arm, and return to the starting position. Repeat __________ times. Complete this exercise __________ times a day. Exercise I:Shoulder Extension 1. Sit in a stable chair without armrests, or stand. 2. Secure an exercise band to a stable object in front of you where it is at shoulder height. 3. Hold one end of the exercise band in each hand. Your palms should face each other. 4. Straighten your elbows and lift your hands up to shoulder height. 5. Step back, away from the secured end of the exercise band, until the band is tight and there is no slack. 6. Squeeze your shoulder blades together as you pull your hands down to the sides of your thighs. Stop when your hands are straight down by your sides. Do not let your hands go behind your body. 7. Hold for __________ seconds. 8. Slowly return to  the starting position. Repeat __________ times. Complete this exercise __________ times a day. Exercise J:Standing Shoulder Row 1. Sit in a stable chair without armrests, or stand. 2. Secure an exercise band to a stable object in front of you so it is at waist height. 3. Hold one end of the exercise band in each hand. Your palms should be in a thumbs-up position. 4. Bend each of your elbows to an "L" shape (about 90 degrees) and keep your upper arms at your sides. 5. Step back until the band is tight and there is no slack. 6. Slowly pull your elbows back behind you. 7. Hold for __________ seconds. 8. Slowly return to the starting position. Repeat __________ times. Complete this exercise __________ times a day. Exercise K:Shoulder Press-Ups  1. Sit in a stable chair that has armrests. Sit upright, with your feet flat on the floor. 2. Put your hands on the armrests so your elbows are bent and your fingers are pointing forward. Your hands should be about even with the sides of your body. 3. Push down on the armrests and use your arms to lift yourself off of the chair. Straighten your elbows and lift yourself up as much as you comfortably can. ? Move your shoulder blades down, and avoid letting your shoulders move up toward your ears. ? Keep your feet on the ground. As you get stronger, your feet should support less of your body weight as you lift yourself up. 4. Hold for __________ seconds. 5. Slowly lower yourself back into the chair. Repeat __________ times. Complete this exercise __________ times a day. Exercise L: Wall Push-Ups  1. Stand so you are facing a stable wall. Your feet should be about one arm-length away from the wall. 2. Lean forward and place your palms on the wall at shoulder height. 3. Keep your feet flat on the floor as you bend your elbows and lean forward toward the wall. 4. Hold for __________ seconds. 5. Straighten your elbows to push yourself back to the starting  position. Repeat __________ times.  Complete this exercise __________ times a day. This information is not intended to replace advice given to you by your health care provider. Make sure you discuss any questions you have with your health care provider. Document Released: 02/01/2005 Document Revised: 12/13/2015 Document Reviewed: 11/29/2014 Elsevier Interactive Patient Education  [employer].   ..Your MRI has been ordered.  We will contact your insurance company for approval. After the authorization is received the MRI and a return appointment will be scheduled with you by phone.  If you do not hear from [place] within 5 business days please call [phone] and ask for the pre-authorization representative in our office.

## 2017-08-31 ENCOUNTER — Other Ambulatory Visit (HOSPITAL_COMMUNITY): Payer: Self-pay | Admitting: Internal Medicine

## 2017-09-04 ENCOUNTER — Ambulatory Visit (HOSPITAL_COMMUNITY): Admission: RE | Admit: 2017-09-04 | Payer: Medicare Other | Source: Ambulatory Visit

## 2017-09-05 ENCOUNTER — Ambulatory Visit (HOSPITAL_COMMUNITY)
Admission: RE | Admit: 2017-09-05 | Discharge: 2017-09-05 | Disposition: A | Discharge status: Home / Self Care | Payer: Medicare Other | Source: Ambulatory Visit | Attending: Orthopaedic Surgery | Admitting: Orthopaedic Surgery

## 2017-09-05 ENCOUNTER — Ambulatory Visit (HOSPITAL_COMMUNITY)
Admission: RE | Admit: 2017-09-05 | Discharge: 2017-09-05 | Disposition: A | Discharge status: Home / Self Care | Payer: Medicare Other | Source: Ambulatory Visit | Attending: Internal Medicine | Admitting: Internal Medicine

## 2017-09-05 DIAGNOSIS — D1779 Benign lipomatous neoplasm of other sites: Secondary | ICD-10-CM | POA: Diagnosis not present

## 2017-09-05 DIAGNOSIS — M79605 Pain in left leg: Secondary | ICD-10-CM

## 2017-09-05 DIAGNOSIS — M62522 Muscle wasting and atrophy, not elsewhere classified, left upper arm: Secondary | ICD-10-CM | POA: Insufficient documentation

## 2017-09-05 DIAGNOSIS — M75122 Complete rotator cuff tear or rupture of left shoulder, not specified as traumatic: Secondary | ICD-10-CM | POA: Diagnosis not present

## 2017-09-05 DIAGNOSIS — G8929 Other chronic pain: Secondary | ICD-10-CM | POA: Diagnosis present

## 2017-09-05 DIAGNOSIS — M25512 Pain in left shoulder: Secondary | ICD-10-CM | POA: Insufficient documentation

## 2017-09-05 DIAGNOSIS — I771 Stricture of artery: Secondary | ICD-10-CM | POA: Diagnosis not present

## 2017-09-05 DIAGNOSIS — M25412 Effusion, left shoulder: Secondary | ICD-10-CM | POA: Insufficient documentation

## 2017-09-06 ENCOUNTER — Telehealth: Payer: Self-pay | Admitting: Orthopedic Surgery

## 2017-09-06 ENCOUNTER — Ambulatory Visit (INDEPENDENT_AMBULATORY_CARE_PROVIDER_SITE_OTHER): Payer: Medicare Other | Admitting: Orthopaedic Surgery

## 2017-09-06 ENCOUNTER — Encounter: Payer: Self-pay | Admitting: Orthopaedic Surgery

## 2017-09-06 VITALS — BP 152/62 | HR 67 | Temp 97.4°F | Ht 67.0 in | Wt 196.0 lb

## 2017-09-06 DIAGNOSIS — G8929 Other chronic pain: Secondary | ICD-10-CM

## 2017-09-06 DIAGNOSIS — M25512 Pain in left shoulder: Secondary | ICD-10-CM | POA: Diagnosis not present

## 2017-09-06 NOTE — Telephone Encounter (Signed)
Patient of Dr. Brooke Bonito needs to be scheduled to see you please.  Can you review your schedule and advise me of a date and time please?  Thanks

## 2017-09-06 NOTE — Patient Instructions (Signed)
Steps to Quit Smoking Smoking tobacco can be bad for your health. It can also affect almost every organ in your body. Smoking puts you and people around you at risk for many serious long-lasting (chronic) diseases. Quitting smoking is hard, but it is one of the best things that you can do for your health. It is never too late to quit. What are the benefits of quitting smoking? When you quit smoking, you lower your risk for getting serious diseases and conditions. They can include:  Lung cancer or lung disease.  Heart disease.  Stroke.  Heart attack.  Not being able to have children (infertility).  Weak bones (osteoporosis) and broken bones (fractures).  If you have coughing, wheezing, and shortness of breath, those symptoms may get better when you quit. You may also get sick less often. If you are pregnant, quitting smoking can help to lower your chances of having a baby of low birth weight. What can I do to help me quit smoking? Talk with your doctor about what can help you quit smoking. Some things you can do (strategies) include:  Quitting smoking totally, instead of slowly cutting back how much you smoke over a period of time.  Going to in-person counseling. You are more likely to quit if you go to many counseling sessions.  Using resources and support systems, such as: ? Online chats with a counselor. ? Phone quitlines. ? Printed self-help materials. ? Support groups or group counseling. ? Text messaging programs. ? Mobile phone apps or applications.  Taking medicines. Some of these medicines may have nicotine in them. If you are pregnant or breastfeeding, do not take any medicines to quit smoking unless your doctor says it is okay. Talk with your doctor about counseling or other things that can help you.  Talk with your doctor about using more than one strategy at the same time, such as taking medicines while you are also going to in-person counseling. This can help make  quitting easier. What things can I do to make it easier to quit? Quitting smoking might feel very hard at first, but there is a lot that you can do to make it easier. Take these steps:  Talk to your family and friends. Ask them to support and encourage you.  Call phone quitlines, reach out to support groups, or work with a counselor.  Ask people who smoke to not smoke around you.  Avoid places that make you want (trigger) to smoke, such as: ? Bars. ? Parties. ? Smoke-break areas at work.  Spend time with people who do not smoke.  Lower the stress in your life. Stress can make you want to smoke. Try these things to help your stress: ? Getting regular exercise. ? Deep-breathing exercises. ? Yoga. ? Meditating. ? Doing a body scan. To do this, close your eyes, focus on one area of your body at a time from head to toe, and notice which parts of your body are tense. Try to relax the muscles in those areas.  Download or buy apps on your mobile phone or tablet that can help you stick to your quit plan. There are many free apps, such as QuitGuide from the CDC (Centers for Disease Control and Prevention). You can find more support from smokefree.gov and other websites.  This information is not intended to replace advice given to you by your health care provider. Make sure you discuss any questions you have with your health care provider. Document Released: 01/14/2009 Document   Revised: 11/16/2015 Document Reviewed: 08/04/2014 Elsevier Interactive Patient Education  2018 Elsevier Inc.  

## 2017-09-06 NOTE — Progress Notes (Signed)
Patient JW:JXBJ KANAI Fowler, male DOB:1950/09/14, 67 y.o. YNW:[id number]  Chief Complaint  Patient presents with  . Results    left shoulder MRI review   . Shoulder Pain    left    HPI  Willie Fowler is a 67 y.o. male who has pain of the left shoulder and a "knot" over the left shoulder.  He denies any trauma.  He had a MRI showing: IMPRESSION: 1. Large full-thickness retracted tears of the infraspinatus and supraspinatus tendons with atrophy of those muscles. 2. Prominent glenohumeral joint effusion with diffuse thinning of the articular cartilage. 3. Benign-appearing intramuscular lipoma of the deltoid muscle which represents the palpable abnormality.  I have explained the findings to him.  I will have him see Dr. Aline Brochure.  He is agreeable to this. HPI  Body mass index is 30.7 kg/m.  ROS  Review of Systems  Respiratory: Negative for cough and shortness of breath.   Cardiovascular: Negative for chest pain and leg swelling.  Musculoskeletal: Positive for arthralgias, back pain and joint swelling.    Past Medical History:  Diagnosis Date  . Cancer ([place])   . Chronic ankle pain   . Chronic back pain   . Chronic left shoulder pain   . GERD (gastroesophageal reflux disease)   . High cholesterol   . History of alcohol abuse   . Hypertension   . Lumbar radiculopathy   . Pain management    [facility]    Past Surgical History:  Procedure Laterality Date  . BACK SURGERY    . TONSILLECTOMY      Family History  Problem Relation Age of Onset  . Cancer Mother        breast  . Cancer Sister        breast    Social History Social History   Tobacco Use  . Smoking status: Current Every Day Smoker    Packs/day: 0.50    Years: 40.00    Pack years: 20.00    Types: Cigarettes  . Smokeless tobacco: Never Used  Substance Use Topics  . Alcohol use: Yes    Comment: weekly  . Drug use: No    No Known Allergies  Current Outpatient Medications  Medication Sig Dispense  Refill  . etodolac (LODINE) 400 MG tablet Take 400 mg by mouth 2 (two) times daily.    Marland Kitchen ezetimibe (ZETIA) 10 MG tablet Take 10 mg by mouth daily.    Marland Kitchen gabapentin (NEURONTIN) 600 MG tablet Take 1 tablet by mouth 3 (three) times daily.    Marland Kitchen lidocaine (XYLOCAINE) 5 % ointment Apply 1 application topically daily.    Marland Kitchen lisinopril (PRINIVIL,ZESTRIL) 40 MG tablet Take 40 mg by mouth daily.    Marland Kitchen omeprazole (PRILOSEC) 20 MG capsule Take 20 mg by mouth daily.    Marland Kitchen oxybutynin (DITROPAN) 5 MG tablet Take 5-15 mg by mouth 2 (two) times daily.    . Oxycodone HCl 10 MG TABS Take 10 mg by mouth every 6 (six) hours as needed (Pain).    . pregabalin (LYRICA) 150 MG capsule Take 150 mg by mouth 2 (two) times daily.    Marland Kitchen senna (SENOKOT) 8.6 MG tablet Take 1 tablet by mouth daily.    . tamsulosin (FLOMAX) 0.4 MG CAPS capsule Take 0.4 mg by mouth daily.    Marland Kitchen topiramate (TOPAMAX) 50 MG tablet Take 1 tablet by mouth 2 (two) times daily.    . traMADol (ULTRAM) 50 MG tablet Take by mouth every 6 (six)  hours as needed.    . VENTOLIN HFA 108 (90 BASE) MCG/ACT inhaler Inhale 2 puffs into the lungs every 4 (four) hours as needed.    . verapamil (CALAN-SR) 240 MG CR tablet Take 240 mg by mouth daily.    . cyclobenzaprine (FLEXERIL) 10 MG tablet Take 1 tablet (10 mg total) by mouth 3 (three) times daily as needed. (Patient not taking: Reported on 08/30/2017) 21 tablet 0  . ondansetron (ZOFRAN ODT) 4 MG disintegrating tablet Take 1 tablet (4 mg total) by mouth every 8 (eight) hours as needed for nausea or vomiting. (Patient not taking: Reported on 08/30/2017) 20 tablet 0   No current facility-administered medications for this visit.      Physical Exam  Blood pressure (!) 152/62, pulse 67, temperature (!) 97.4 F (36.3 C), height 5\' 7"  (1.702 m), weight 196 lb (88.9 kg).  Constitutional: overall normal hygiene, normal nutrition, well developed, normal grooming, normal body habitus. Assistive  device:none  Musculoskeletal: gait and station Limp none, muscle tone and strength are normal, no tremors or atrophy is present.  .  Neurological: coordination overall normal.  Deep tendon reflex/nerve stretch intact.  Sensation normal.  Cranial nerves II-XII intact.   Skin:   Normal overall no scars, lesions, ulcers or rashes. No psoriasis.  Psychiatric: Alert and oriented x 3.  Recent memory intact, remote memory unclear.  Normal mood and affect. Well groomed.  Good eye contact.  Cardiovascular: overall no swelling, no varicosities, no edema bilaterally, normal temperatures of the legs and arms, no clubbing, cyanosis and good capillary refill.  Lymphatic: palpation is normal.  Left shoulder with cystic area anterior and superior about size of grape.  ROM is good but tender in the extremes.  NV intact.  Grips normal.  All other systems reviewed and are negative   The patient has been educated about the nature of the problem(s) and counseled on treatment options.  The patient appeared to understand what I have discussed and is in agreement with it.  Encounter Diagnosis  Name Primary?  . Chronic left shoulder pain Yes    PLAN Call if any problems.  Precautions discussed.  Continue current medications.   Return to clinic to see Dr. Aline Brochure   Electronically Signed Sanjuana Kava, MD 6/6/20191:35 PM

## 2017-09-07 NOTE — Telephone Encounter (Signed)
June 18th afternoon 20 min

## 2017-09-18 ENCOUNTER — Encounter: Payer: Self-pay | Admitting: Orthopedic Surgery

## 2017-09-18 ENCOUNTER — Ambulatory Visit (INDEPENDENT_AMBULATORY_CARE_PROVIDER_SITE_OTHER): Payer: Medicare Other | Admitting: Orthopedic Surgery

## 2017-09-18 VITALS — BP 126/66 | HR 84 | Ht 67.0 in | Wt 197.0 lb

## 2017-09-18 DIAGNOSIS — M75122 Complete rotator cuff tear or rupture of left shoulder, not specified as traumatic: Secondary | ICD-10-CM | POA: Diagnosis not present

## 2017-09-18 DIAGNOSIS — M25512 Pain in left shoulder: Secondary | ICD-10-CM

## 2017-09-18 DIAGNOSIS — G8929 Other chronic pain: Secondary | ICD-10-CM

## 2017-09-18 NOTE — Progress Notes (Signed)
PREOP CONSULT/REFERRAL INTRA-OFFICE FROM DR Tyrone Apple   Chief Complaint  Patient presents with  . Shoulder Pain    Left shoulder pain, Consult from Dr. Luna Glasgow.    MEDICAL DECISION SECTION  xrays ordered? No  Reports Clinical:  Pain left shoulder,swelling   X-rays were done of the left shoulder, two views.   There is slight degenerative changes of the [facility] joint and the shoulder on the left.  No fracture or calcific deposit seen.  Bone quality is good.   Impression:  Slight degenerative changes of the left shoulder, no acute findings.   Electronically Signed Sanjuana Kava, MD 5/30/20198:54 AM FINDINGS: Rotator cuff: There is a 6 x 4 cm retracted tear of the supraspinatus and infraspinatus tendons. The tear extends into the musculotendinous junction of the infraspinatus. Subscapularis and teres minor tendons are intact.   Muscles: Moderate atrophy of the supraspinatus muscle. Slight atrophy of the infraspinatus muscle.   Biceps long head:  Properly located and intact.   Acromioclavicular Joint: Slight arthritic changes of the AC joint. Minimal osteophytes on the inferior aspects of the acromion and distal clavicle might predispose to impingement. Type 2 acromion.   Glenohumeral Joint: Moderate glenohumeral joint effusion. Slight diffuse thinning of the articular cartilage. No focal cartilage defects.   Labrum: Degeneration of the posterior labrum without a discrete tear.   Bones: Slight degenerative changes of the greater tuberosity of the proximal humerus.   Other: 4 x 2.2 x 1.8 cm intramuscular lipoma of the posterosuperior aspect of the left deltoid muscle. There are some internal septations in the mass but there is no nodularity.   IMPRESSION: 1. Large full-thickness retracted tears of the infraspinatus and supraspinatus tendons with atrophy of those muscles. 2. Prominent glenohumeral joint effusion with diffuse thinning of the articular cartilage. 3.  Benign-appearing intramuscular lipoma of the deltoid muscle which represents the palpable abnormality.     Electronically Signed   By: Lorriane Shire M.D.   On: 09/05/2017 16:28   My independent reading of xrays: normal gleno-humeral joint   MRI shows massive rotator cuff tear with atrophy and degeneration     Encounter Diagnoses  Name Primary?  . Chronic left shoulder pain Yes  . Nontraumatic complete tear of left rotator cuff      PLAN:   Referral to Dr Marlou Sa ? Reverse shoulder replacement LEFT .   Chief Complaint  Patient presents with  . Shoulder Pain    Left shoulder pain, Consult from Dr. Luna Glasgow.    67 year old male referred for possible rotator cuff repair complains of shoulder pain for several months x-rays and MRI were obtained he has a massive rotator cuff tear with atrophy and mild glenohumeral arthrosis  He says that his shoulder is weak he has dull pain over the left shoulder he has difficulty with range of motion.   Review of Systems  Constitutional: Negative for chills and fever.  Respiratory: Negative for shortness of breath.   Cardiovascular: Negative for chest pain.  Musculoskeletal: Negative for back pain.  Neurological: Negative for tingling.     Past Medical History:  Diagnosis Date  . Cancer ([place])   . Chronic ankle pain   . Chronic back pain   . Chronic left shoulder pain   . GERD (gastroesophageal reflux disease)   . High cholesterol   . History of alcohol abuse   . Hypertension   . Lumbar radiculopathy   . Pain management    [facility]    Past Surgical History:  Procedure Laterality Date  . BACK SURGERY    . TONSILLECTOMY      Family History  Problem Relation Age of Onset  . Cancer Mother        breast  . Cancer Sister        breast   Social History   Tobacco Use  . Smoking status: Current Every Day Smoker    Packs/day: 0.50    Years: 40.00    Pack years: 20.00    Types: Cigarettes  . Smokeless tobacco: Never Used   Substance Use Topics  . Alcohol use: Yes    Comment: weekly  . Drug use: No    No Known Allergies   No outpatient medications have been marked as taking for the 09/18/17 encounter (Office Visit) with Willie Civil, MD.    BP 126/66   Pulse 84   Ht 5\' 7"  (1.702 m)   Wt 197 lb (89.4 kg)   BMI 30.85 kg/m   Physical Exam  Constitutional: He is oriented to person, place, and time. He appears well-developed and well-nourished.  Vital signs have been reviewed and are stable. Gen. appearance the patient is well-developed and well-nourished with normal grooming and hygiene.   HENT:  Head: Normocephalic and atraumatic.  Neurological: He is alert and oriented to person, place, and time.  Skin: Skin is warm and dry. Capillary refill takes less than 2 seconds. No erythema.  Psychiatric: He has a normal mood and affect. His behavior is normal. Judgment and thought content normal.  Vitals reviewed.   Right Shoulder Exam   Range of Motion  The patient has normal right shoulder ROM.  Muscle Strength  The patient has normal right shoulder strength.  Other  Erythema: absent Scars: absent Sensation: normal Pulse: present   Left Shoulder Exam   Tenderness  The patient is experiencing tenderness in the acromioclavicular joint.  Range of Motion  Active abduction: abnormal  Passive abduction: abnormal  Extension: normal  External rotation: normal  Forward flexion: abnormal  Internal rotation 0 degrees: abnormal  Internal rotation 90 degrees: abnormal   Muscle Strength  Abduction: 5/5  Internal rotation: 5/5  External rotation: 5/5  Supraspinatus: 4/5  Subscapularis: 5/5  Biceps: 5/5   Tests  Apprehension: negative Cross arm: negative Impingement: positive Drop arm: negative Sulcus: absent  Other  Erythema: absent Scars: absent Sensation: normal Pulse: present         Arther Abbott, MD 09/18/2017 2:25 PM

## 2017-09-20 ENCOUNTER — Emergency Department (HOSPITAL_COMMUNITY): Payer: Medicare Other

## 2017-09-20 ENCOUNTER — Emergency Department (HOSPITAL_COMMUNITY)
Admission: EM | Admit: 2017-09-20 | Discharge: 2017-09-20 | Disposition: A | Discharge status: Home / Self Care | Payer: Medicare Other | Attending: Emergency Medicine | Admitting: Emergency Medicine

## 2017-09-20 ENCOUNTER — Encounter (HOSPITAL_COMMUNITY): Payer: Self-pay | Admitting: Emergency Medicine

## 2017-09-20 ENCOUNTER — Other Ambulatory Visit: Payer: Self-pay

## 2017-09-20 DIAGNOSIS — G8929 Other chronic pain: Secondary | ICD-10-CM | POA: Diagnosis not present

## 2017-09-20 DIAGNOSIS — F17210 Nicotine dependence, cigarettes, uncomplicated: Secondary | ICD-10-CM | POA: Insufficient documentation

## 2017-09-20 DIAGNOSIS — Z859 Personal history of malignant neoplasm, unspecified: Secondary | ICD-10-CM | POA: Insufficient documentation

## 2017-09-20 DIAGNOSIS — I10 Essential (primary) hypertension: Secondary | ICD-10-CM | POA: Diagnosis not present

## 2017-09-20 DIAGNOSIS — Z79899 Other long term (current) drug therapy: Secondary | ICD-10-CM | POA: Insufficient documentation

## 2017-09-20 DIAGNOSIS — M545 Low back pain, unspecified: Secondary | ICD-10-CM

## 2017-09-20 MED ORDER — CYCLOBENZAPRINE HCL 5 MG PO TABS: 5.0000 mg | ORAL_TABLET | Freq: Three times a day (TID) | ORAL | 0 refills | Status: DC | PRN

## 2017-09-20 MED ORDER — CYCLOBENZAPRINE HCL 10 MG PO TABS
10.0000 mg | ORAL_TABLET | Freq: Once | ORAL | Status: AC
  Administered 2017-09-20: 10 mg via ORAL
  Filled 2017-09-20: qty 1

## 2017-09-20 MED ORDER — HYDROCODONE-ACETAMINOPHEN 5-325 MG PO TABS
1.0000 | ORAL_TABLET | Freq: Once | ORAL | Status: AC
  Administered 2017-09-20: 1 via ORAL
  Filled 2017-09-20: qty 1

## 2017-09-20 NOTE — ED Provider Notes (Signed)
[facility] EMERGENCY DEPARTMENT Provider Note   CSN: [id number] Arrival date & time: 09/20/17  3151     History   Chief Complaint Chief Complaint  Patient presents with  . Back Pain    HPI Willie Fowler is a 67 y.o. male with hx of chronic pain presents to the ED with exacerbation of lower back pain that started 2 days ago. Patient denies new injury. Patient denies UTI symptoms. Patient had been in a pain management clinic at [facility] but they transferred his care to his PCP so he did not have to drive so far.   HPI  Past Medical History:  Diagnosis Date  . Cancer ([place])   . Chronic ankle pain   . Chronic back pain   . Chronic left shoulder pain   . GERD (gastroesophageal reflux disease)   . High cholesterol   . History of alcohol abuse   . Hypertension   . Lumbar radiculopathy   . Pain management    [facility]    Patient Active Problem List   Diagnosis Date Noted  . Pneumonia 01/14/2017  . LLL pneumonia ([place]) 01/14/2017  . Acute respiratory distress 01/14/2017  . Acute respiratory failure with hypoxia ([place]) 01/14/2017  . Chronic back pain 01/14/2017  . Opioid dependence ([place]) 01/14/2017  . High cholesterol 01/14/2017  . Current every day smoker 01/14/2017  . Leukocytosis 01/14/2017  . Constipation due to opioid therapy 01/14/2017  . Hypertension 01/14/2017  . Hyponatremia 01/14/2017  . Dehydration 01/14/2017    Past Surgical History:  Procedure Laterality Date  . BACK SURGERY    . TONSILLECTOMY          Home Medications    Prior to Admission medications   Medication Sig Start Date End Date Taking? Authorizing Provider  cyclobenzaprine (FLEXERIL) 10 MG tablet Take 1 tablet (10 mg total) by mouth 3 (three) times daily as needed. 05/18/16   Triplett, Tammy, PA-C  etodolac (LODINE) 400 MG tablet Take 400 mg by mouth 2 (two) times daily.    [provider]  ezetimibe (ZETIA) 10 MG tablet Take 10 mg by mouth daily. 08/18/11   [provider]    Fluticasone-Salmeterol (ADVAIR) 250-50 MCG/DOSE AEPB Inhale 1 puff into the lungs 2 (two) times daily. 06/25/17   [provider]  gabapentin (NEURONTIN) 600 MG tablet Take 1 tablet by mouth 3 (three) times daily. 12/19/16   [provider]  lidocaine (XYLOCAINE) 5 % ointment Apply 1 application topically daily. 02/28/15   [provider]  lisinopril (PRINIVIL,ZESTRIL) 40 MG tablet Take 40 mg by mouth daily. 12/25/14   [provider]  omeprazole (PRILOSEC) 20 MG capsule Take 20 mg by mouth daily.    [provider]  oxybutynin (DITROPAN) 5 MG tablet Take 5-15 mg by mouth 2 (two) times daily.    [provider]  Oxycodone HCl 10 MG TABS Take 10 mg by mouth every 6 (six) hours as needed (Pain).    [provider]  pregabalin (LYRICA) 150 MG capsule Take 150 mg by mouth 2 (two) times daily.    [provider]  senna (SENOKOT) 8.6 MG tablet Take 1 tablet by mouth daily.    [provider]  tamsulosin (FLOMAX) 0.4 MG CAPS capsule Take 0.4 mg by mouth daily. 04/19/12   [provider]  topiramate (TOPAMAX) 50 MG tablet Take 1 tablet by mouth 2 (two) times daily. 05/01/16   [provider]  traMADol (ULTRAM) 50 MG tablet Take  by mouth every 6 (six) hours as needed.    [provider]  VENTOLIN HFA 108 (90 BASE) MCG/ACT inhaler Inhale 2 puffs into the lungs every 4 (four) hours as needed. 03/16/15   [provider]  verapamil (CALAN-SR) 240 MG CR tablet Take 240 mg by mouth daily. 11/19/08   [provider]    Family History Family History  Problem Relation Age of Onset  . Cancer Mother        breast  . Cancer Sister        breast    Social History Social History   Tobacco Use  . Smoking status: Current Every Day Smoker    Packs/day: 0.50    Years: 40.00    Pack years: 20.00    Types: Cigarettes  . Smokeless tobacco: Never Used  Substance Use Topics  . Alcohol use:  Yes    Comment: weekly  . Drug use: No     Allergies   Patient has no known allergies.   Review of Systems Review of Systems  Constitutional: Negative for diaphoresis and fever.  HENT: Negative.   Eyes: Negative for visual disturbance.  Respiratory: Negative for cough and shortness of breath.   Gastrointestinal: Negative for abdominal pain, nausea and vomiting.  Genitourinary: Negative for dysuria, frequency and urgency.  Musculoskeletal: Positive for back pain. Negative for neck pain.  Skin: Negative for wound.  Neurological: Negative for syncope and headaches.  Hematological: Negative for adenopathy.  Psychiatric/Behavioral: Negative for confusion.     Physical Exam Updated Vital Signs BP 138/80 (BP Location: Right Arm)   Pulse 81   Temp 98 F (36.7 C) (Oral)   Resp 15   Ht 5\' 7"  (1.702 m)   Wt 89.4 kg (197 lb)   SpO2 98%   BMI 30.85 kg/m   Physical Exam  Constitutional: He is oriented to person, place, and time. He appears well-developed and well-nourished. No distress.  HENT:  Head: Normocephalic and atraumatic.  Eyes: Conjunctivae and EOM are normal.  Neck: Normal range of motion. Neck supple.  Cardiovascular: Normal rate and regular rhythm.  Pulmonary/Chest: Effort normal and breath sounds normal.  Abdominal: Soft. Bowel sounds are normal. There is no tenderness.  Musculoskeletal: Normal range of motion.  Neurological: He is alert and oriented to person, place, and time. He has normal strength. No cranial nerve deficit. Gait normal.  Reflex Scores:      Bicep reflexes are 2+ on the right side and 2+ on the left side.      Brachioradialis reflexes are 2+ on the right side and 2+ on the left side.      Patellar reflexes are 2+ on the right side and 2+ on the left side. Grips are equal, no foot drag.  Skin: Skin is warm and dry.  Psychiatric: He has a normal mood and affect.  Nursing note and vitals reviewed.    ED Treatments / Results  Labs (all labs  ordered are listed, but only abnormal results are displayed) Labs Reviewed - No data to display Radiology Dg Lumbar Spine Complete  Result Date: 09/20/2017 CLINICAL DATA:  Lumbar pain with radiation down left leg. No injury. EXAM: LUMBAR SPINE - COMPLETE 4+ VIEW COMPARISON:  Lumbar spine 07/31/2006. FINDINGS: Lumbar spine numbered with the lowest segmented appearing lumbar shaped vertebrae on lateral view as L5. No paraspinal soft tissue abnormalities identified. Multilevel laminectomies. Multilevel severe degenerative change lumbar spine. Particular severe disc space loss is present at L3-L4, L4-L5, and  L5-S1. Degenerative changes have progressed from prior study of 07/31/2006. Aortoiliac atherosclerotic vascular calcification. IMPRESSION: 1. Diffuse multilevel degenerative change lumbar spine, particularly severe at L3-L4, L4-L5, L5-S1. These changes are progressive from prior study of 07/31/2006. No acute bony abnormality. 2.  Aortoiliac atherosclerotic vascular disease. Electronically Signed   By: Marcello Moores  Register   On: 09/20/2017 11:33    Procedures Procedures (including critical care time)  Medications Ordered in ED Medications  cyclobenzaprine (FLEXERIL) tablet 10 mg (10 mg Oral Given 09/20/17 1131)  HYDROcodone-acetaminophen (NORCO/VICODIN) 5-325 MG per tablet 1 tablet (1 tablet Oral Given 09/20/17 1130)     Initial Impression / Assessment and Plan / ED Course  I have reviewed the triage vital signs and the nursing notes. Patient with back pain.  No neurological deficits and normal neuro exam.  Patient can walk but states is painful.  No loss of bowel or bladder control.  No concern for cauda equina.  No fever, night sweats, weight loss, h/o cancer, IVDU.  RICE protocol and pain medicine indicated and discussed with patient.   Final Clinical Impressions(s) / ED Diagnoses   Final diagnoses:  Acute exacerbation of chronic low back pain    ED Discharge Orders    None       Debroah Baller [place], [place] 09/20/17 1143    Nat Christen, MD 09/21/17 [phone]

## 2017-09-20 NOTE — ED Triage Notes (Signed)
Patient complaining of lower back pain x 2 days. Denies injury. Denies dysuria.

## 2017-09-20 NOTE — Discharge Instructions (Addendum)
Follow up with your doctor for continued treatment of your pain.

## 2017-10-03 ENCOUNTER — Ambulatory Visit (INDEPENDENT_AMBULATORY_CARE_PROVIDER_SITE_OTHER): Payer: Medicare Other | Admitting: Orthopedic Surgery

## 2017-10-08 ENCOUNTER — Ambulatory Visit (INDEPENDENT_AMBULATORY_CARE_PROVIDER_SITE_OTHER): Payer: Medicare Other | Admitting: Orthopedic Surgery

## 2017-10-08 ENCOUNTER — Encounter (INDEPENDENT_AMBULATORY_CARE_PROVIDER_SITE_OTHER): Payer: Self-pay | Admitting: Orthopedic Surgery

## 2017-10-08 DIAGNOSIS — M12812 Other specific arthropathies, not elsewhere classified, left shoulder: Secondary | ICD-10-CM | POA: Diagnosis not present

## 2017-10-08 DIAGNOSIS — G8929 Other chronic pain: Secondary | ICD-10-CM | POA: Diagnosis not present

## 2017-10-08 DIAGNOSIS — M25512 Pain in left shoulder: Secondary | ICD-10-CM

## 2017-10-08 NOTE — Progress Notes (Signed)
Office Visit Note   Patient: Willie Fowler           Date of Birth: [birth year]           MRN: [id number] Visit Date: 10/08/2017 Requested by: Abran Richard, MD [address] [place], [place] [address] PCP: Abran Richard, MD  Subjective: Chief Complaint  Patient presents with  . Left Shoulder - Pain    HPI: Willie Fowler is a patient with left shoulder pain.  He was doing reasonably well last year but this year his shoulder has become significantly painful as well as week.  Denies any history of injury.  He does report a constant dull aching pain along with decreased range of motion.  He is right-hand dominant.  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  The patient is a smoker and has hypertension but does not have diabetes.  The patient describes pain which wakes him from sleep at night.  He does live alone but has some friends in the area.              ROS: All systems reviewed are negative as they relate to the chief complaint within the history of present illness.  Patient denies  fevers or chills.   Assessment & Plan: Visit Diagnoses:  1. Chronic left shoulder pain   2. Rotator cuff arthropathy, left     Plan: Impression is left shoulder symptomatic rotator cuff arthropathy with less than 60 degrees of active forward flexion and abduction.  Discussed with Willie Fowler operative and nonoperative treatment options for this problem.  Used a model of reverse shoulder replacement to describe the surgical plan and rationale for this type of shoulder replacement.  Discussed also the risk and benefits of surgery including but not limited to infection nerve vessel damage dislocation as well as a lifting limit of around 20 pounds for at least the first 6 months.  Patient understands  risk benefits but wants to proceed with surgical intervention which I think would be good in his case.  All questions answered.  Follow-Up Instructions: No follow-ups on file.   Orders:  Orders Placed This Encounter  Procedures  . CT SHOULDER LEFT WO CONTRAST   No orders of the defined types were placed in this encounter.     Procedures: No procedures performed   Clinical Data: No additional findings.  Objective: Vital Signs: There were no vitals taken for this visit.  Physical Exam:   Constitutional: Patient appears well-developed HEENT:  Head: Normocephalic Eyes:EOM are normal Neck: Normal range of motion Cardiovascular: Normal rate Pulmonary/chest: Effort normal Neurologic: Patient is alert Skin: Skin is warm Psychiatric: Patient has normal mood and affect    Ortho Exam: Orthopedic exam demonstrates good cervical spine range of motion.  Patient has 5 out of 5 grip EPL FPL interosseous wrist flexion wrist extension bicep triceps and deltoid strength.  He does have about a 2 x 2 centimeter palpable lipoma on the anterior aspect of the deltoid which is mobile and nontender.  He has about slightly less than 60 degrees of active abduction and less than 60 degrees of active forward flexion on the left.  Does have infraspinatus and supraspinatus weakness on that left-hand side compared to 5 out of 5 subscap strength.  Passively I can  get him up to full forward flexion and abduction to about 105.  Deltoid does fire.  Specialty Comments:  No specialty comments available.  Imaging: No results found.   PMFS History: Patient Active Problem List   Diagnosis Date Noted  . Pneumonia 01/14/2017  . LLL pneumonia ([place]) 01/14/2017  . Acute respiratory distress 01/14/2017  . Acute respiratory failure with hypoxia ([place]) 01/14/2017  . Chronic back pain 01/14/2017  . Opioid dependence ([place]) 01/14/2017  . High cholesterol 01/14/2017  . Current every day smoker 01/14/2017  .  Leukocytosis 01/14/2017  . Constipation due to opioid therapy 01/14/2017  . Hypertension 01/14/2017  . Hyponatremia 01/14/2017  . Dehydration 01/14/2017   Past Medical History:  Diagnosis Date  . Cancer ([facility])   . Chronic ankle pain   . Chronic back pain   . Chronic left shoulder pain   . GERD (gastroesophageal reflux disease)   . High cholesterol   . History of alcohol abuse   . Hypertension   . Lumbar radiculopathy   . Pain management    [facility]    Family History  Problem Relation Age of Onset  . Cancer Mother        breast  . Cancer Sister        breast    Past Surgical History:  Procedure Laterality Date  . BACK SURGERY    . TONSILLECTOMY     Social History   Occupational History  . Not on file  Tobacco Use  . Smoking status: Current Every Day Smoker    Packs/day: 0.50    Years: 40.00    Pack years: 20.00    Types: Cigarettes  . Smokeless tobacco: Never Used  Substance and Sexual Activity  . Alcohol use: Yes    Comment: weekly  . Drug use: No  . Sexual activity: Not on file

## 2017-10-11 ENCOUNTER — Encounter: Admit: 2017-10-11 | Discharge: 2017-10-12 | Payer: MEDICARE | Attending: Medical Oncology | Primary: Medical Oncology

## 2017-10-11 ENCOUNTER — Encounter: Admit: 2017-10-11 | Discharge: 2017-10-12 | Payer: MEDICARE

## 2017-10-11 DIAGNOSIS — C7951 Secondary malignant neoplasm of bone: Secondary | ICD-10-CM

## 2017-10-11 DIAGNOSIS — C61 Malignant neoplasm of prostate: Principal | ICD-10-CM

## 2017-10-18 ENCOUNTER — Telehealth (INDEPENDENT_AMBULATORY_CARE_PROVIDER_SITE_OTHER): Payer: Self-pay | Admitting: *Deleted

## 2017-10-18 NOTE — Telephone Encounter (Signed)
Patient will need the CT Scan (pre op PSI) then I can schedule.  The referral is in, but I don't see any appointments for Imaging.

## 2017-10-18 NOTE — Telephone Encounter (Signed)
Dr Marlou Sa please advise on pain meds. Thanks  Jackelyn Poling what is the status of surgery being scheduled?

## 2017-10-18 NOTE — Telephone Encounter (Signed)
Can you please tell me status of getting ct scan of shoulder scheduled?

## 2017-10-18 NOTE — Telephone Encounter (Signed)
Pt would Dr. Marlou Sa to call in something for pain in his shoulder until his surgery. Please advise.

## 2017-10-18 NOTE — Telephone Encounter (Signed)
On 10/15/17 [facility] contacted pt to schedule had to leave vm for pt to call to schedule appt. Pt can call back at [phone]

## 2017-10-18 NOTE — Telephone Encounter (Signed)
FYI Patient not returning calls to get scan scheduled

## 2017-10-19 NOTE — Telephone Encounter (Signed)
ok 

## 2017-10-25 ENCOUNTER — Encounter: Admit: 2017-10-25 | Discharge: 2017-10-26 | Payer: MEDICARE

## 2017-10-25 DIAGNOSIS — N50819 Testicular pain, unspecified: Principal | ICD-10-CM

## 2017-11-07 ENCOUNTER — Telehealth (INDEPENDENT_AMBULATORY_CARE_PROVIDER_SITE_OTHER): Payer: Self-pay | Admitting: Orthopedic Surgery

## 2017-11-07 ENCOUNTER — Ambulatory Visit
Admission: RE | Admit: 2017-11-07 | Discharge: 2017-11-07 | Disposition: A | Discharge status: Home / Self Care | Payer: Medicare Other | Source: Ambulatory Visit | Attending: Orthopedic Surgery | Admitting: Orthopedic Surgery

## 2017-11-07 DIAGNOSIS — M25512 Pain in left shoulder: Principal | ICD-10-CM

## 2017-11-07 DIAGNOSIS — G8929 Other chronic pain: Secondary | ICD-10-CM

## 2017-11-07 NOTE — Telephone Encounter (Signed)
Can you please advise? Thanks.

## 2017-11-07 NOTE — Telephone Encounter (Signed)
[place]  Oxycodone

## 2017-11-07 NOTE — Telephone Encounter (Signed)
I'm ok with tramadol if no hx of seizures, but nothing stronger

## 2017-11-07 NOTE — Telephone Encounter (Signed)
I tried calling patient to discuss, no answer, voicemail not set up per greeting on phone. Unable to leave message.

## 2017-11-08 NOTE — Telephone Encounter (Signed)
Tried calling again about medication. No answer.

## 2017-11-09 NOTE — Telephone Encounter (Signed)
Still no answer in regards to discussing meds. Will wait until patient returns call.

## 2017-11-21 ENCOUNTER — Encounter (INDEPENDENT_AMBULATORY_CARE_PROVIDER_SITE_OTHER): Payer: Self-pay | Admitting: Orthopedic Surgery

## 2017-11-21 ENCOUNTER — Ambulatory Visit (INDEPENDENT_AMBULATORY_CARE_PROVIDER_SITE_OTHER): Payer: Medicare Other | Admitting: Orthopedic Surgery

## 2017-11-21 DIAGNOSIS — M12812 Other specific arthropathies, not elsewhere classified, left shoulder: Secondary | ICD-10-CM | POA: Diagnosis not present

## 2017-11-21 NOTE — Progress Notes (Signed)
Office Visit Note   Patient: Willie Fowler           Date of Birth: Sep 03, 1950           MRN: [id number] Visit Date: 11/21/2017 Requested by: Abran Richard, MD [address] [place], [place] [address] PCP: Abran Richard, MD  Subjective: Chief Complaint  Patient presents with  . Left Shoulder - Follow-up    HPI: Willie Fowler is a patient with left shoulder pain.  He has known rotator cuff arthropathy.  CT scan performed for preoperative templating purposes.  Patient does have superior migration of the humeral head and mild glenoid deformity primarily superiorly.  Patient does not have diabetes or heart issues.  He does live alone.  Currently he is taking Lyrica and gabapentin.              ROS: All systems reviewed are negative as they relate to the chief complaint within the history of present illness.  Patient denies  fevers or chills.   Assessment & Plan: Visit Diagnoses:  1. Rotator cuff arthropathy, left     Plan: Impression is symptomatic rotator cuff arthropathy left shoulder.  Patient has about 40 degrees of forward flexion and about 80 degrees of abduction.  Plan is for reverse shoulder replacement.  Risk and benefits are discussed including but not limited to infection nerve vessel damage dislocation as well as diminished lifting activity for at least the first year.  Patient understands the risk benefits and wishes to proceed.  All questions answered  Follow-Up Instructions: No follow-ups on file.   Orders:  No orders of the defined types were placed in this encounter.  No orders of the defined types were placed in this encounter.     Procedures: No procedures performed   Clinical Data: No additional findings.  Objective: Vital Signs: There were no vitals taken for this visit.  Physical Exam:   Constitutional: Patient appears well-developed HEENT:  Head: Normocephalic Eyes:EOM are normal Neck: Normal range of motion Cardiovascular: Normal  rate Pulmonary/chest: Effort normal Neurologic: Patient is alert Skin: Skin is warm Psychiatric: Patient has normal mood and affect    Ortho Exam: Ortho exam demonstrates full active and passive range of motion of the cervical spine.  He has about 45 degrees of isolated forward flexion actively and about 80 degrees of isolated abduction actively.  Cuff weakness is present.  Deltoid is functional.  Motor or sensory function to the hand is intact.  He does have about a 2 x 2 centimeter cyst on the anterolateral margin of the acromion.  CT scan review of that cyst demonstrates that it is consistent with fat signal.  Well encapsulated.  We will plan on excision of that cyst as well at the time of surgical procedure.  Specialty Comments:  No specialty comments available.  Imaging: No results found.   PMFS History: Patient Active Problem List   Diagnosis Date Noted  . Pneumonia 01/14/2017  . LLL pneumonia ([place]) 01/14/2017  . Acute respiratory distress 01/14/2017  . Acute respiratory failure with hypoxia ([place]) 01/14/2017  . Chronic back pain 01/14/2017  . Opioid dependence ([place]) 01/14/2017  . High cholesterol 01/14/2017  . Current every day smoker 01/14/2017  . Leukocytosis 01/14/2017  . Constipation due to opioid therapy 01/14/2017  . Hypertension 01/14/2017  . Hyponatremia 01/14/2017  . Dehydration 01/14/2017   Past Medical History:  Diagnosis Date  . Cancer ([facility])   . Chronic ankle pain   . Chronic back  pain   . Chronic left shoulder pain   . GERD (gastroesophageal reflux disease)   . High cholesterol   . History of alcohol abuse   . Hypertension   . Lumbar radiculopathy   . Pain management    [facility]    Family History  Problem Relation Age of Onset  . Cancer Mother        breast  . Cancer Sister        breast    Past Surgical History:  Procedure Laterality Date  . BACK SURGERY    . TONSILLECTOMY     Social History   Occupational History  . Not on file  Tobacco  Use  . Smoking status: Current Every Day Smoker    Packs/day: 0.50    Years: 40.00    Pack years: 20.00    Types: Cigarettes  . Smokeless tobacco: Never Used  Substance and Sexual Activity  . Alcohol use: Yes    Comment: weekly  . Drug use: No  . Sexual activity: Not on file

## 2017-12-10 ENCOUNTER — Other Ambulatory Visit (INDEPENDENT_AMBULATORY_CARE_PROVIDER_SITE_OTHER): Payer: Self-pay | Admitting: Orthopedic Surgery

## 2017-12-10 DIAGNOSIS — M12812 Other specific arthropathies, not elsewhere classified, left shoulder: Secondary | ICD-10-CM

## 2017-12-12 ENCOUNTER — Encounter (HOSPITAL_COMMUNITY): Payer: Self-pay

## 2017-12-12 ENCOUNTER — Encounter (HOSPITAL_COMMUNITY)
Admission: RE | Admit: 2017-12-12 | Discharge: 2017-12-12 | Disposition: A | Discharge status: Home / Self Care | Payer: Medicare Other | Source: Ambulatory Visit | Attending: Orthopedic Surgery | Admitting: Orthopedic Surgery

## 2017-12-12 ENCOUNTER — Other Ambulatory Visit: Payer: Self-pay

## 2017-12-12 DIAGNOSIS — Z01812 Encounter for preprocedural laboratory examination: Secondary | ICD-10-CM | POA: Diagnosis not present

## 2017-12-12 LAB — BASIC METABOLIC PANEL
ANION GAP: 11 (ref 5–15)
BUN: 13 mg/dL (ref 8–23)
CHLORIDE: 103 mmol/L (ref 98–111)
CO2: 27 mmol/L (ref 22–32)
Calcium: 9.4 mg/dL (ref 8.9–10.3)
Creatinine, Ser: 1.14 mg/dL (ref 0.61–1.24)
GFR calc Af Amer: >60 mL/min (ref >60)
GLUCOSE: 126 mg/dL — AB (ref 70–99)
POTASSIUM: 4.2 mmol/L (ref 3.5–5.1)
Sodium: 141 mmol/L (ref 135–145)

## 2017-12-12 LAB — URINALYSIS, ROUTINE W REFLEX MICROSCOPIC
Bilirubin Urine: NEGATIVE
GLUCOSE, UA: NEGATIVE mg/dL
Hgb urine dipstick: NEGATIVE
Ketones, ur: NEGATIVE mg/dL
LEUKOCYTES UA: NEGATIVE
Nitrite: NEGATIVE
PROTEIN: NEGATIVE mg/dL
Specific Gravity, Urine: 1.01 (ref 1.005–1.030)
pH: 5 (ref 5.0–8.0)

## 2017-12-12 LAB — SURGICAL PCR SCREEN
MRSA, PCR: NEGATIVE
Staphylococcus aureus: POSITIVE — AB

## 2017-12-12 LAB — PROTIME-INR
INR: 0.96
Prothrombin Time: 12.7 seconds (ref 11.4–15.2)

## 2017-12-12 LAB — CBC
HEMATOCRIT: 45.1 % (ref 39.0–52.0)
HEMOGLOBIN: 14.5 g/dL (ref 13.0–17.0)
MCH: 29.5 pg (ref 26.0–34.0)
MCHC: 32.2 g/dL (ref 30.0–36.0)
MCV: 91.9 fL (ref 78.0–100.0)
Platelets: 260 10*3/uL (ref 150–400)
RBC: 4.91 MIL/uL (ref 4.22–5.81)
RDW: 14.2 % (ref 11.5–15.5)
WBC: 11.1 10*3/uL — ABNORMAL HIGH (ref 4.0–10.5)

## 2017-12-12 NOTE — Pre-Procedure Instructions (Signed)
WARD BOISSONNEAULT  12/12/2017      [place], [place] - [address] [place] [address] Phone: [phone] Fax: [phone]    Your procedure is scheduled on  Tuesday 12/18/17  Report to [facility] Admitting at 530 A.M.  Call this number if you have problems the morning of surgery:  [phone]   Remember:  Do not eat or drink after midnight.     Take these medicines the morning of surgery with A SIP OF WATER -  ADVAIR INHALER IF NEEDED, GABAPENTIN, OMEPRAZOLE (PRILOSEC), OXYBUTYNIN (DITROPAN), LYRICA, TAMSULOSIN (FLOMAX), VENTOLIN INHALER IF NEEDED, TRAMADOL  IF NEEDED  7 days prior to surgery STOP taking any Aspirin(unless otherwise instructed by your surgeon), Aleve, Naproxen, ETODOLAC,  Ibuprofen, Motrin, Advil, Goody's, BC's, all herbal medications, fish oil, and all vitamins    Do not wear jewelry, make-up or nail polish.  Do not wear lotions, powders, or perfumes, or deodorant.  Do not shave 48 hours prior to surgery.  Men may shave face and neck.  Do not bring valuables to the hospital.  [facility] is not responsible for any belongings or valuables.  Contacts, dentures or bridgework may not be worn into surgery.  Leave your suitcase in the car.  After surgery it may be brought to your room.  For patients admitted to the hospital, discharge time will be determined by your treatment team.  Patients discharged the day of surgery will not be allowed to drive home.   Name and phone number of your driver:    Special instructions:  [facility] - Preparing for Surgery  Before surgery, you can play an important role.  Because skin is not sterile, your skin needs to be as free of germs as possible.  You can reduce the number of germs on you skin by washing with CHG (chlorahexidine gluconate) soap before surgery.  CHG is an antiseptic cleaner which kills germs and bonds with the skin to continue killing germs even  after washing.  Oral Hygiene is also important in reducing the risk of infection.  Remember to brush your teeth with your regular toothpaste the morning of surgery.  Please DO NOT use if you have an allergy to CHG or antibacterial soaps.  If your skin becomes reddened/irritated stop using the CHG and inform your nurse when you arrive at Short Stay.  Do not shave (including legs and underarms) for at least 48 hours prior to the first CHG shower.  You may shave your face.  Please follow these instructions carefully:   1.  Shower with CHG Soap the night before surgery and the morning of Surgery.  2.  If you choose to wash your hair, wash your hair first as usual with your normal shampoo.  3.  After you shampoo, rinse your hair and body thoroughly to remove the shampoo. 4.  Use CHG as you would any other liquid soap.  You can apply chg directly to the skin and wash gently with a      scrungie or washcloth.           5.  Apply the CHG Soap to your body ONLY FROM THE NECK DOWN.   Do not use on open wounds or open sores. Avoid contact with your eyes, ears, mouth and genitals (private parts).  Wash genitals (private parts) with your normal soap.  6.  Wash thoroughly, paying special attention to the area where your surgery  will be performed.  7.  Thoroughly rinse your body with warm water from the neck down.  8.  DO NOT shower/wash with your normal soap after using and rinsing off the CHG Soap.  9.  Pat yourself dry with a clean towel.            10.  Wear clean pajamas.            11.  Place clean sheets on your bed the night of your first shower and do not sleep with pets.  Day of Surgery  Do not apply any lotions/deoderants the morning of surgery.   Please wear clean clothes to the hospital/surgery center. Remember to brush your teeth with toothpaste.     Please read over the following fact sheets that you were given. Pain Booklet, MRSA Information and Surgical Site Infection  Prevention

## 2017-12-12 NOTE — Progress Notes (Signed)
Surgical PCR +staph aureus. Mupirocin called into [facility] [phone]. Patient made aware of result and to start Mupirocin as soon as possible.

## 2017-12-13 LAB — URINE CULTURE: CULTURE: NO GROWTH

## 2017-12-14 NOTE — Progress Notes (Signed)
thx

## 2017-12-17 MED ORDER — CEFAZOLIN SODIUM-DEXTROSE 2-4 GM/100ML-% IV SOLN
2.0000 g | INTRAVENOUS | Status: DC
  Filled 2017-12-17: qty 100

## 2017-12-17 MED ORDER — CHLORHEXIDINE GLUCONATE 4 % EX LIQD: 60.0000 mL | Freq: Once | CUTANEOUS | Status: DC

## 2017-12-17 NOTE — Anesthesia Preprocedure Evaluation (Addendum)
Anesthesia Evaluation  Patient identified by MRN, date of birth, ID band Patient awake    Reviewed: Allergy & Precautions, NPO status , Patient's Chart, lab work & pertinent test results  Airway Mallampati: II  TM Distance: >3 FB Neck ROM: Limited    Dental  (+) Dental Advisory Given   Pulmonary Current Smoker,    breath sounds clear to auscultation       Cardiovascular hypertension, Pt. on medications  Rhythm:Regular Rate:Normal     Neuro/Psych  Neuromuscular disease    GI/Hepatic GERD  ,  Endo/Other  negative endocrine ROS  Renal/GU negative Renal ROS     Musculoskeletal   Abdominal   Peds  Hematology negative hematology ROS (+)   Anesthesia Other Findings   Reproductive/Obstetrics                            Lab Results  Component Value Date   WBC 11.1 (H) 12/12/2017   HGB 14.5 12/12/2017   HCT 45.1 12/12/2017   MCV 91.9 12/12/2017   PLT 260 12/12/2017   Lab Results  Component Value Date   CREATININE 1.14 12/12/2017   BUN 13 12/12/2017   NA 141 12/12/2017   K 4.2 12/12/2017   CL 103 12/12/2017   CO2 27 12/12/2017   Lab Results  Component Value Date   INR 0.96 12/12/2017    Anesthesia Physical Anesthesia Plan  ASA: II  Anesthesia Plan: General   Post-op Pain Management: GA combined w/ Regional for post-op pain   Induction: Intravenous  PONV Risk Score and Plan: 1 and Dexamethasone, Ondansetron and Treatment may vary due to age or medical condition  Airway Management Planned: Oral ETT  Additional Equipment:   Intra-op Plan:   Post-operative Plan: Extubation in OR  Informed Consent: I have reviewed the patients History and Physical, chart, labs and discussed the procedure including the risks, benefits and alternatives for the proposed anesthesia with the patient or authorized representative who has indicated his/her understanding and acceptance.   Dental  advisory given  Plan Discussed with: CRNA  Anesthesia Plan Comments:        Anesthesia Quick Evaluation

## 2017-12-18 ENCOUNTER — Inpatient Hospital Stay (HOSPITAL_COMMUNITY)
Admission: RE | Admit: 2017-12-18 | Discharge: 2017-12-19 | DRG: 483 | Disposition: A | Discharge status: Home / Self Care | Payer: Medicare Other | Source: Ambulatory Visit | Attending: Orthopedic Surgery | Admitting: Orthopedic Surgery

## 2017-12-18 ENCOUNTER — Encounter (HOSPITAL_COMMUNITY)
Admission: RE | Disposition: A | Discharge status: Home / Self Care | Payer: Self-pay | Source: Ambulatory Visit | Attending: Orthopedic Surgery

## 2017-12-18 ENCOUNTER — Inpatient Hospital Stay (HOSPITAL_COMMUNITY): Payer: Medicare Other | Admitting: Anesthesiology

## 2017-12-18 ENCOUNTER — Inpatient Hospital Stay (HOSPITAL_COMMUNITY): Payer: Medicare Other

## 2017-12-18 ENCOUNTER — Encounter (HOSPITAL_COMMUNITY): Payer: Self-pay | Admitting: *Deleted

## 2017-12-18 DIAGNOSIS — K219 Gastro-esophageal reflux disease without esophagitis: Secondary | ICD-10-CM | POA: Diagnosis present

## 2017-12-18 DIAGNOSIS — D1739 Benign lipomatous neoplasm of skin and subcutaneous tissue of other sites: Secondary | ICD-10-CM | POA: Diagnosis present

## 2017-12-18 DIAGNOSIS — Z7982 Long term (current) use of aspirin: Secondary | ICD-10-CM

## 2017-12-18 DIAGNOSIS — E7800 Pure hypercholesterolemia, unspecified: Secondary | ICD-10-CM | POA: Diagnosis present

## 2017-12-18 DIAGNOSIS — Z79899 Other long term (current) drug therapy: Secondary | ICD-10-CM | POA: Diagnosis not present

## 2017-12-18 DIAGNOSIS — G8929 Other chronic pain: Secondary | ICD-10-CM

## 2017-12-18 DIAGNOSIS — I10 Essential (primary) hypertension: Secondary | ICD-10-CM | POA: Diagnosis present

## 2017-12-18 DIAGNOSIS — Z7951 Long term (current) use of inhaled steroids: Secondary | ICD-10-CM | POA: Diagnosis not present

## 2017-12-18 DIAGNOSIS — F17210 Nicotine dependence, cigarettes, uncomplicated: Secondary | ICD-10-CM | POA: Diagnosis present

## 2017-12-18 DIAGNOSIS — M19019 Primary osteoarthritis, unspecified shoulder: Secondary | ICD-10-CM

## 2017-12-18 DIAGNOSIS — M19012 Primary osteoarthritis, left shoulder: Principal | ICD-10-CM | POA: Diagnosis present

## 2017-12-18 DIAGNOSIS — M12812 Other specific arthropathies, not elsewhere classified, left shoulder: Secondary | ICD-10-CM | POA: Diagnosis not present

## 2017-12-18 HISTORY — PX: REVERSE SHOULDER ARTHROPLASTY: SHX5054

## 2017-12-18 HISTORY — PX: EAR CYST EXCISION: SHX22

## 2017-12-18 SURGERY — ARTHROPLASTY, SHOULDER, TOTAL, REVERSE
Anesthesia: General | Site: Shoulder | Laterality: Left

## 2017-12-18 MED ORDER — HYDROMORPHONE HCL 1 MG/ML IJ SOLN: 0.5000 mg | Freq: Four times a day (QID) | INTRAMUSCULAR | Status: DC | PRN

## 2017-12-18 MED ORDER — ROCURONIUM BROMIDE 100 MG/10ML IV SOLN
INTRAVENOUS | Status: DC | PRN
  Administered 2017-12-18: 50 mg via INTRAVENOUS
  Administered 2017-12-18: 20 mg via INTRAVENOUS

## 2017-12-18 MED ORDER — METOCLOPRAMIDE HCL 5 MG/ML IJ SOLN: 5.0000 mg | Freq: Three times a day (TID) | INTRAMUSCULAR | Status: DC | PRN

## 2017-12-18 MED ORDER — MIDAZOLAM HCL 2 MG/2ML IJ SOLN
INTRAMUSCULAR | Status: AC
  Filled 2017-12-18: qty 2

## 2017-12-18 MED ORDER — 0.9 % SODIUM CHLORIDE (POUR BTL) OPTIME
TOPICAL | Status: DC | PRN
  Administered 2017-12-18 (×4): 1000 mL

## 2017-12-18 MED ORDER — LIDOCAINE 2% (20 MG/ML) 5 ML SYRINGE
INTRAMUSCULAR | Status: AC
  Filled 2017-12-18: qty 5

## 2017-12-18 MED ORDER — OXYBUTYNIN CHLORIDE 5 MG PO TABS: 5.0000 mg | ORAL_TABLET | ORAL | Status: DC

## 2017-12-18 MED ORDER — ONDANSETRON HCL 4 MG/2ML IJ SOLN: 4.0000 mg | Freq: Once | INTRAMUSCULAR | Status: DC | PRN

## 2017-12-18 MED ORDER — EPHEDRINE 5 MG/ML INJ
INTRAVENOUS | Status: AC
  Filled 2017-12-18: qty 10

## 2017-12-18 MED ORDER — EZETIMIBE 10 MG PO TABS
10.0000 mg | ORAL_TABLET | Freq: Every day | ORAL | Status: DC
  Administered 2017-12-18 – 2017-12-19 (×2): 10 mg via ORAL
  Filled 2017-12-18 (×2): qty 1

## 2017-12-18 MED ORDER — ALBUTEROL SULFATE (2.5 MG/3ML) 0.083% IN NEBU: 3.0000 mL | INHALATION_SOLUTION | RESPIRATORY_TRACT | Status: DC | PRN

## 2017-12-18 MED ORDER — ONDANSETRON HCL 4 MG/2ML IJ SOLN: 4.0000 mg | Freq: Four times a day (QID) | INTRAMUSCULAR | Status: DC | PRN

## 2017-12-18 MED ORDER — OXYBUTYNIN CHLORIDE 5 MG PO TABS
5.0000 mg | ORAL_TABLET | Freq: Every day | ORAL | Status: DC
  Administered 2017-12-19: 5 mg via ORAL
  Filled 2017-12-18: qty 1

## 2017-12-18 MED ORDER — LABETALOL HCL 5 MG/ML IV SOLN
INTRAVENOUS | Status: AC
  Filled 2017-12-18: qty 4

## 2017-12-18 MED ORDER — LABETALOL HCL 5 MG/ML IV SOLN: 10.0000 mg | INTRAVENOUS | Status: DC | PRN

## 2017-12-18 MED ORDER — OXYCODONE HCL 5 MG PO TABS
10.0000 mg | ORAL_TABLET | ORAL | Status: DC | PRN
  Administered 2017-12-18 – 2017-12-19 (×4): 10 mg via ORAL
  Filled 2017-12-18 (×4): qty 2

## 2017-12-18 MED ORDER — BUPIVACAINE LIPOSOME 1.3 % IJ SUSP
INTRAMUSCULAR | Status: DC | PRN
  Administered 2017-12-18: 10 mL via PERINEURAL

## 2017-12-18 MED ORDER — DEXAMETHASONE SODIUM PHOSPHATE 10 MG/ML IJ SOLN
INTRAMUSCULAR | Status: DC | PRN
  Administered 2017-12-18: 10 mg via INTRAVENOUS

## 2017-12-18 MED ORDER — VANCOMYCIN HCL 1000 MG IV SOLR
INTRAVENOUS | Status: AC
  Filled 2017-12-18: qty 1000

## 2017-12-18 MED ORDER — VERAPAMIL HCL ER 240 MG PO TBCR
240.0000 mg | EXTENDED_RELEASE_TABLET | Freq: Every day | ORAL | Status: DC
  Administered 2017-12-18 – 2017-12-19 (×2): 240 mg via ORAL
  Filled 2017-12-18 (×2): qty 1

## 2017-12-18 MED ORDER — BUPIVACAINE HCL (PF) 0.5 % IJ SOLN
INTRAMUSCULAR | Status: DC | PRN
  Administered 2017-12-18: 15 mL via PERINEURAL

## 2017-12-18 MED ORDER — CEFAZOLIN SODIUM-DEXTROSE 2-3 GM-%(50ML) IV SOLR
INTRAVENOUS | Status: DC | PRN
  Administered 2017-12-18: 2 g via INTRAVENOUS

## 2017-12-18 MED ORDER — DOCUSATE SODIUM 100 MG PO CAPS
100.0000 mg | ORAL_CAPSULE | Freq: Two times a day (BID) | ORAL | Status: DC
  Administered 2017-12-18 – 2017-12-19 (×3): 100 mg via ORAL
  Filled 2017-12-18 (×3): qty 1

## 2017-12-18 MED ORDER — FENTANYL CITRATE (PF) 100 MCG/2ML IJ SOLN: 25.0000 ug | INTRAMUSCULAR | Status: DC | PRN

## 2017-12-18 MED ORDER — FENTANYL CITRATE (PF) 250 MCG/5ML IJ SOLN
INTRAMUSCULAR | Status: DC | PRN
  Administered 2017-12-18 (×2): 100 ug via INTRAVENOUS
  Administered 2017-12-18 (×2): 50 ug via INTRAVENOUS

## 2017-12-18 MED ORDER — MIDAZOLAM HCL 2 MG/2ML IJ SOLN
INTRAMUSCULAR | Status: DC | PRN
  Administered 2017-12-18: 2 mg via INTRAVENOUS

## 2017-12-18 MED ORDER — METOCLOPRAMIDE HCL 5 MG PO TABS: 5.0000 mg | ORAL_TABLET | Freq: Three times a day (TID) | ORAL | Status: DC | PRN

## 2017-12-18 MED ORDER — MENTHOL 3 MG MT LOZG: 1.0000 | LOZENGE | OROMUCOSAL | Status: DC | PRN

## 2017-12-18 MED ORDER — FENTANYL CITRATE (PF) 250 MCG/5ML IJ SOLN
INTRAMUSCULAR | Status: AC
  Filled 2017-12-18: qty 5

## 2017-12-18 MED ORDER — SUGAMMADEX SODIUM 200 MG/2ML IV SOLN
INTRAVENOUS | Status: DC | PRN
  Administered 2017-12-18: 175 mg via INTRAVENOUS

## 2017-12-18 MED ORDER — EPHEDRINE SULFATE-NACL 50-0.9 MG/10ML-% IV SOSY
PREFILLED_SYRINGE | INTRAVENOUS | Status: DC | PRN
  Administered 2017-12-18: 7.5 mg via INTRAVENOUS
  Administered 2017-12-18 (×4): 5 mg via INTRAVENOUS

## 2017-12-18 MED ORDER — LABETALOL HCL 5 MG/ML IV SOLN: 10.0000 mg | Freq: Once | INTRAVENOUS | Status: DC

## 2017-12-18 MED ORDER — CEFAZOLIN SODIUM-DEXTROSE 2-4 GM/100ML-% IV SOLN
2.0000 g | Freq: Four times a day (QID) | INTRAVENOUS | Status: AC
  Administered 2017-12-18 – 2017-12-19 (×3): 2 g via INTRAVENOUS
  Filled 2017-12-18 (×3): qty 100

## 2017-12-18 MED ORDER — OXYBUTYNIN CHLORIDE 5 MG PO TABS: 5.0000 mg | ORAL_TABLET | Freq: Two times a day (BID) | ORAL | Status: DC

## 2017-12-18 MED ORDER — DEXAMETHASONE SODIUM PHOSPHATE 10 MG/ML IJ SOLN
INTRAMUSCULAR | Status: AC
  Filled 2017-12-18: qty 1

## 2017-12-18 MED ORDER — TRAMADOL HCL 50 MG PO TABS: 50.0000 mg | ORAL_TABLET | Freq: Two times a day (BID) | ORAL | Status: DC | PRN

## 2017-12-18 MED ORDER — ONDANSETRON HCL 4 MG/2ML IJ SOLN
INTRAMUSCULAR | Status: AC
  Filled 2017-12-18: qty 2

## 2017-12-18 MED ORDER — LISINOPRIL 40 MG PO TABS
40.0000 mg | ORAL_TABLET | Freq: Every day | ORAL | Status: DC
  Administered 2017-12-18 – 2017-12-19 (×2): 40 mg via ORAL
  Filled 2017-12-18 (×2): qty 1

## 2017-12-18 MED ORDER — PROPOFOL 10 MG/ML IV BOLUS
INTRAVENOUS | Status: AC
  Filled 2017-12-18: qty 20

## 2017-12-18 MED ORDER — LACTATED RINGERS IV SOLN
INTRAVENOUS | Status: DC
  Administered 2017-12-18: 14:00:00 via INTRAVENOUS

## 2017-12-18 MED ORDER — LACTATED RINGERS IV SOLN
INTRAVENOUS | Status: DC | PRN
  Administered 2017-12-18: 07:00:00 via INTRAVENOUS

## 2017-12-18 MED ORDER — PROPOFOL 10 MG/ML IV BOLUS
INTRAVENOUS | Status: DC | PRN
  Administered 2017-12-18: 30 mg via INTRAVENOUS
  Administered 2017-12-18: 20 mg via INTRAVENOUS
  Administered 2017-12-18: 150 mg via INTRAVENOUS

## 2017-12-18 MED ORDER — PHENYLEPHRINE 40 MCG/ML (10ML) SYRINGE FOR IV PUSH (FOR BLOOD PRESSURE SUPPORT)
PREFILLED_SYRINGE | INTRAVENOUS | Status: AC
  Filled 2017-12-18: qty 10

## 2017-12-18 MED ORDER — ONDANSETRON HCL 4 MG/2ML IJ SOLN
INTRAMUSCULAR | Status: DC | PRN
  Administered 2017-12-18: 4 mg via INTRAVENOUS

## 2017-12-18 MED ORDER — PREGABALIN 75 MG PO CAPS
150.0000 mg | ORAL_CAPSULE | Freq: Three times a day (TID) | ORAL | Status: DC
  Administered 2017-12-18 – 2017-12-19 (×3): 150 mg via ORAL
  Filled 2017-12-18 (×3): qty 2

## 2017-12-18 MED ORDER — TAMSULOSIN HCL 0.4 MG PO CAPS
0.4000 mg | ORAL_CAPSULE | Freq: Every day | ORAL | Status: DC
  Administered 2017-12-18 – 2017-12-19 (×2): 0.4 mg via ORAL
  Filled 2017-12-18 (×2): qty 1

## 2017-12-18 MED ORDER — OXYBUTYNIN CHLORIDE 5 MG PO TABS
15.0000 mg | ORAL_TABLET | Freq: Every day | ORAL | Status: DC
  Administered 2017-12-18: 15 mg via ORAL
  Filled 2017-12-18: qty 3

## 2017-12-18 MED ORDER — SODIUM CHLORIDE 0.9 % IV SOLN
INTRAVENOUS | Status: DC | PRN
  Administered 2017-12-18: 50 ug/min via INTRAVENOUS

## 2017-12-18 MED ORDER — ETODOLAC 400 MG PO TABS
400.0000 mg | ORAL_TABLET | Freq: Two times a day (BID) | ORAL | Status: DC
  Administered 2017-12-18 – 2017-12-19 (×3): 400 mg via ORAL
  Filled 2017-12-18 (×3): qty 1

## 2017-12-18 MED ORDER — MOMETASONE FURO-FORMOTEROL FUM 200-5 MCG/ACT IN AERO
2.0000 | INHALATION_SPRAY | Freq: Two times a day (BID) | RESPIRATORY_TRACT | Status: DC
  Administered 2017-12-18 – 2017-12-19 (×2): 2 via RESPIRATORY_TRACT
  Filled 2017-12-18: qty 8.8

## 2017-12-18 MED ORDER — GABAPENTIN 600 MG PO TABS
600.0000 mg | ORAL_TABLET | Freq: Three times a day (TID) | ORAL | Status: DC
  Administered 2017-12-18 – 2017-12-19 (×3): 600 mg via ORAL
  Filled 2017-12-18 (×3): qty 1

## 2017-12-18 MED ORDER — ROCURONIUM BROMIDE 50 MG/5ML IV SOSY
PREFILLED_SYRINGE | INTRAVENOUS | Status: AC
  Filled 2017-12-18: qty 10

## 2017-12-18 MED ORDER — VANCOMYCIN HCL 1000 MG IV SOLR
INTRAVENOUS | Status: DC | PRN
  Administered 2017-12-18: 1000 mg via TOPICAL

## 2017-12-18 MED ORDER — ONDANSETRON HCL 4 MG PO TABS: 4.0000 mg | ORAL_TABLET | Freq: Four times a day (QID) | ORAL | Status: DC | PRN

## 2017-12-18 MED ORDER — ASPIRIN EC 81 MG PO TBEC
81.0000 mg | DELAYED_RELEASE_TABLET | Freq: Every day | ORAL | Status: DC
  Administered 2017-12-18 – 2017-12-19 (×2): 81 mg via ORAL
  Filled 2017-12-18 (×2): qty 1

## 2017-12-18 MED ORDER — PHENYLEPHRINE 40 MCG/ML (10ML) SYRINGE FOR IV PUSH (FOR BLOOD PRESSURE SUPPORT)
PREFILLED_SYRINGE | INTRAVENOUS | Status: DC | PRN
  Administered 2017-12-18: 80 ug via INTRAVENOUS
  Administered 2017-12-18 (×4): 40 ug via INTRAVENOUS
  Administered 2017-12-18: 80 ug via INTRAVENOUS

## 2017-12-18 MED ORDER — PHENOL 1.4 % MT LIQD: 1.0000 | OROMUCOSAL | Status: DC | PRN

## 2017-12-18 MED ORDER — PANTOPRAZOLE SODIUM 40 MG PO TBEC
40.0000 mg | DELAYED_RELEASE_TABLET | Freq: Every day | ORAL | Status: DC
  Administered 2017-12-18 – 2017-12-19 (×2): 40 mg via ORAL
  Filled 2017-12-18 (×2): qty 1

## 2017-12-18 MED ORDER — ACETAMINOPHEN 500 MG PO TABS
1000.0000 mg | ORAL_TABLET | Freq: Four times a day (QID) | ORAL | Status: AC
  Administered 2017-12-18 – 2017-12-19 (×4): 1000 mg via ORAL
  Filled 2017-12-18 (×3): qty 2

## 2017-12-18 SURGICAL SUPPLY — 67 items
ALCOHOL 70% 16 OZ (MISCELLANEOUS) ×3 IMPLANT
BLADE SAW SGTL 13X75X1.27 (BLADE) ×3 IMPLANT
CALIBRATOR GLENOID VIP 5-D (SYSTAGENIX WOUND MANAGEMENT) ×3 IMPLANT
CLOSURE WOUND 1/2 X4 (GAUZE/BANDAGES/DRESSINGS) ×1
COVER SURGICAL LIGHT HANDLE (MISCELLANEOUS) ×3 IMPLANT
CUP SUT UNIV REVERS 39+2 LT (Shoulder) ×3 IMPLANT
DRAPE INCISE IOBAN 66X45 STRL (DRAPES) ×6 IMPLANT
DRAPE U-SHAPE 47X51 STRL (DRAPES) ×6 IMPLANT
DRSG AQUACEL AG ADV 3.5X10 (GAUZE/BANDAGES/DRESSINGS) ×3 IMPLANT
DURAPREP 26ML APPLICATOR (WOUND CARE) ×6 IMPLANT
ELECT BLADE 4.0 EZ CLEAN MEGAD (MISCELLANEOUS) ×3
ELECT REM PT RETURN 9FT ADLT (ELECTROSURGICAL) ×3
ELECTRODE BLDE 4.0 EZ CLN MEGD (MISCELLANEOUS) ×1 IMPLANT
ELECTRODE REM PT RTRN 9FT ADLT (ELECTROSURGICAL) ×1 IMPLANT
GLENOID UNI REV MOD 24 +2 LAT (Joint) ×3 IMPLANT
GLENOSPHERE 39+4 LAT/24 UNI RV (Joint) ×3 IMPLANT
GLOVE BIOGEL PI IND STRL 7.5 (GLOVE) ×1 IMPLANT
GLOVE BIOGEL PI IND STRL 8 (GLOVE) ×1 IMPLANT
GLOVE BIOGEL PI INDICATOR 7.5 (GLOVE) ×2
GLOVE BIOGEL PI INDICATOR 8 (GLOVE) ×2
GLOVE ECLIPSE 7.0 STRL STRAW (GLOVE) ×3 IMPLANT
GLOVE SURG ORTHO 8.0 STRL STRW (GLOVE) ×3 IMPLANT
GOWN STRL REUS W/ TWL LRG LVL3 (GOWN DISPOSABLE) ×2 IMPLANT
GOWN STRL REUS W/ TWL XL LVL3 (GOWN DISPOSABLE) IMPLANT
GOWN STRL REUS W/TWL LRG LVL3 (GOWN DISPOSABLE) ×4
GOWN STRL REUS W/TWL XL LVL3 (GOWN DISPOSABLE)
HYDROGEN PEROXIDE 16OZ (MISCELLANEOUS) ×3 IMPLANT
INSERT HUMERAL 39/+6 (Insert) ×3 IMPLANT
KIT BASIN OR (CUSTOM PROCEDURE TRAY) ×3 IMPLANT
KIT TURNOVER KIT B (KITS) ×3 IMPLANT
LOOP VESSEL MAXI BLUE (MISCELLANEOUS) ×3 IMPLANT
MANIFOLD NEPTUNE II (INSTRUMENTS) ×3 IMPLANT
NDL SUT 6 .5 CRC .975X.05 MAYO (NEEDLE) ×1 IMPLANT
NEEDLE MAYO TAPER (NEEDLE) ×2
NS IRRIG 1000ML POUR BTL (IV SOLUTION) ×3 IMPLANT
PACK SHOULDER (CUSTOM PROCEDURE TRAY) ×3 IMPLANT
PAD ARMBOARD 7.5X6 YLW CONV (MISCELLANEOUS) ×6 IMPLANT
PASSER SUT SWANSON 36MM LOOP (INSTRUMENTS) ×3 IMPLANT
PIN SET MODULAR GLENOID SYSTEM (PIN) ×3 IMPLANT
SCREW CENTRAL MOD 30MM (Screw) ×3 IMPLANT
SCREW LOCK PERI 5.5X44 (Screw) ×3 IMPLANT
SCREW PERI LOCK 5.5X24 (Screw) ×3 IMPLANT
SCREW PERI LOCK 5.5X32 (Screw) ×3 IMPLANT
SCREW PERIPHERAL 5.5X20 LOCK (Screw) ×3 IMPLANT
SCRUB BETADINE 4OZ XXX (MISCELLANEOUS) ×3 IMPLANT
SLING ARM IMMOBILIZER LRG (SOFTGOODS) ×3 IMPLANT
SOLUTION BETADINE 4OZ (MISCELLANEOUS) ×3 IMPLANT
SPONGE LAP 18X18 X RAY DECT (DISPOSABLE) ×3 IMPLANT
STEM HUMERAL UNI REVERS SZ9 (Stem) ×3 IMPLANT
STRIP CLOSURE SKIN 1/2X4 (GAUZE/BANDAGES/DRESSINGS) ×2 IMPLANT
SUCTION FRAZIER HANDLE 10FR (MISCELLANEOUS) ×2
SUCTION TUBE FRAZIER 10FR DISP (MISCELLANEOUS) ×1 IMPLANT
SUT FIBERWIRE #2 38 T-5 BLUE (SUTURE) ×9
SUT MAXBRAID (SUTURE) IMPLANT
SUT MNCRL AB 3-0 PS2 18 (SUTURE) ×3 IMPLANT
SUT SILK 2 0 TIES 10X30 (SUTURE) ×3 IMPLANT
SUT VIC AB 0 CT1 27 (SUTURE) ×8
SUT VIC AB 0 CT1 27XBRD ANBCTR (SUTURE) ×4 IMPLANT
SUT VIC AB 1 CT1 27 (SUTURE) ×4
SUT VIC AB 1 CT1 27XBRD ANBCTR (SUTURE) ×2 IMPLANT
SUT VIC AB 2-0 CT1 27 (SUTURE) ×8
SUT VIC AB 2-0 CT1 TAPERPNT 27 (SUTURE) ×4 IMPLANT
SUT VICRYL 0 AB UR-6 (SUTURE) ×12 IMPLANT
SUTURE FIBERWR #2 38 T-5 BLUE (SUTURE) ×3 IMPLANT
TOWEL OR 17X26 10 PK STRL BLUE (TOWEL DISPOSABLE) ×3 IMPLANT
TRAY FOLEY BAG SILVER LF 16FR (CATHETERS) IMPLANT
WATER STERILE IRR 1000ML POUR (IV SOLUTION) ×3 IMPLANT

## 2017-12-18 NOTE — Transfer of Care (Signed)
Immediate Anesthesia Transfer of Care Note  Patient: Willie Fowler  Procedure(s) Performed: LEFT REVERSE SHOULDER ARTHROPLASTY (Left Shoulder) EXCISION CYST LEFT SHOULDER (Left Shoulder)  Patient Location: PACU  Anesthesia Type:GA combined with regional for post-op pain  Level of Consciousness: awake and patient cooperative  Airway & Oxygen Therapy: Patient Spontanous Breathing and Patient connected to face mask oxygen  Post-op Assessment: Report given to RN and Post -op Vital signs reviewed and stable  Post vital signs: Reviewed and stable  Last Vitals:  Vitals Value Taken Time  BP 181/121 12/18/2017 11:38 AM  Temp    Pulse 114 12/18/2017 11:39 AM  Resp 24 12/18/2017 11:39 AM  SpO2 88 % 12/18/2017 11:39 AM  Vitals shown include unvalidated device data.  Last Pain:  Vitals:   12/18/17 0608  TempSrc: Oral  PainSc:          Complications: No apparent anesthesia complications

## 2017-12-18 NOTE — Anesthesia Procedure Notes (Signed)
Anesthesia Regional Block: Interscalene brachial plexus block   Pre-Anesthetic Checklist: ,, timeout performed, Correct Patient, Correct Site, Correct Laterality, Correct Procedure, Correct Position, site marked, Risks and benefits discussed,  Surgical consent,  Pre-op evaluation,  At surgeon's request and post-op pain management  Laterality: Left  Prep: chloraprep       Needles:  Injection technique: Single-shot  Needle Type: Echogenic Needle     Needle Length: 9cm  Needle Gauge: 21     Additional Needles:   Procedures:, nerve stimulator,,, ultrasound used (permanent image in chart),,,,   Nerve Stimulator or Paresthesia:  Response: deltoid and biceps, 0.5 mA,   Additional Responses:   Narrative:  Start time: 12/18/2017 7:08 AM End time: 12/18/2017 7:15 AM Injection made incrementally with aspirations every 5 mL.  Performed by: Personally  Anesthesiologist: Suzette Battiest, MD

## 2017-12-18 NOTE — Brief Op Note (Signed)
12/18/2017  11:29 AM  PATIENT:  Willie Fowler  67 y.o. male  PRE-OPERATIVE DIAGNOSIS:  left shulder rotattor cuff arthropathy, cyst  POST-OPERATIVE DIAGNOSIS:  left shulder rotattor cuff arthropathy, cyst  PROCEDURE:  Procedure(s): LEFT REVERSE SHOULDER ARTHROPLASTY   SURGEON:  Surgeon(s): Marlou Sa, Tonna Corner, MD  ASSISTANT: J queen rnfa  ANESTHESIA:   general  EBL: 250 ml    No intake/output data recorded.  BLOOD ADMINISTERED: none  DRAINS: none   LOCAL MEDICATIONS USED:  none  SPECIMEN:  No Specimen  COUNTS:  YES  TOURNIQUET:  * No tourniquets in log *  DICTATION: .Other Dictation: Dictation Number done  PLAN OF CARE: Admit to inpatient   PATIENT DISPOSITION:  PACU - hemodynamically stable

## 2017-12-18 NOTE — Plan of Care (Signed)
  Problem: Safety: Goal: Ability to remain free from injury will improve Outcome: Progressing   Problem: Education: Goal: Knowledge of General Education information will improve Description Including pain rating scale, medication(s)/side effects and non-pharmacologic comfort measures Outcome: Progressing   Problem: Activity: Goal: Risk for activity intolerance will decrease Outcome: Progressing   Problem: Pain Managment: Goal: General experience of comfort will improve Outcome: Progressing

## 2017-12-18 NOTE — Anesthesia Postprocedure Evaluation (Signed)
Anesthesia Post Note  Patient: Willie Fowler  Procedure(s) Performed: LEFT REVERSE SHOULDER ARTHROPLASTY (Left Shoulder) EXCISION CYST LEFT SHOULDER (Left Shoulder)     Patient location during evaluation: PACU Anesthesia Type: General Level of consciousness: awake and alert Pain management: pain level controlled Vital Signs Assessment: post-procedure vital signs reviewed and stable Respiratory status: spontaneous breathing, nonlabored ventilation, respiratory function stable and patient connected to nasal cannula oxygen Cardiovascular status: blood pressure returned to baseline and stable Postop Assessment: no apparent nausea or vomiting Anesthetic complications: no    Last Vitals:  Vitals:   12/18/17 1220 12/18/17 1231  BP:  140/86  Pulse: 81 80  Resp: 17 18  Temp:    SpO2: 93% 97%    Last Pain:  Vitals:   12/18/17 1300  TempSrc:   PainSc: 9                  Tiajuana Amass

## 2017-12-18 NOTE — Op Note (Signed)
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  PROCEDURE:  Left shoulder reverse shoulder replacement using Arthrex components.  Universal modular baseplate 24 mm +2 lateralized with modular central screw 30 mm 4 peripheral locking screws as well as glenosphere 39+4 lateralized along with size 9  humeral stem and suture cup 39 mm plus 2 left along with humeral insert medium 39 mm +6.  SURGEON:  Meredith Pel, MD  ASSISTANT:  Dyke Brackett, RNFA.  INDICATIONS:  The patient is a 67 year old patient with left shoulder pain.  He presents for operative management after explanation of risks and benefits.  The patient has rotator cuff arthropathy along with a lipoma off the anterolateral margin of the  acromion.  Plan was for both procedures to be done today; however, because of the proximity of the 2 incisions after the reverse was placed, I elected to delay that just to prevent any type of wound complications from the index procedure.  PROCEDURE IN DETAIL:  The patient was brought to the operating room where general anesthetic was induced.  Preoperative antibiotics administered.  Timeout was called.  Left shoulder was prescrubbed with alcohol and Betadine, which was allowed to air dry.   The shoulder and arm in the region was prescrubbed with hydrogen peroxide, alcohol, Betadine and then prepped with DuraPrep solution and draped in sterile procedure.  The patient was placed in the beach chair position with the head in neutral position.   Examination under anesthesia demonstrated pretty reasonable passive range of motion to about 45 degrees of external rotation and forward  flexion and abduction passively above 90.  The timeout was called.  Charlie Pitter was then used to cover the entire  operative field.  Incision was made over the coracoid process and the clavicle extending distally just lateral to the axillary crease.  Skin and subcutaneous tissue were sharply divided.  The cephalic vein was mobilized medially.  Deltopectoral split was  made.  At this time, the biceps tendon was tenodesed to the pec tendon using 0 Vicryl sutures.  The rotator interval was opened up to the base of the coracoid.  Biceps tendon was tagged.  The circumflex vessels were then ligated.  Axillary nerve was  visualized and identified, vessel loop placed around it and it was protected at all times during the case.  At this time, the subscapularis was detached from the lesser tuberosity.  It was peeled back and taken around the 7 o'clock position on the left  shoulder.  The head was dislocated.  The head was cut matching his own version which was approximately 40 degrees.  Appropriate head cut was made.  This was taken to the rotator cuff attachment site.  Two more millimeters of cut was made in order to  facilitate exposure.  Cath was placed and then attention was directed towards exposing the glenoid.  Labrum was excised.  Bankart lesion was created.  Axillary nerve was protected at all times during this capsular exposure.  Capsule was then released  circumferentially around the glenoid.  In accordance with preoperative templating the patient's specific guide, the guide was placed and reaming was performed.  Good bleeding bone was achieved throughout the glenoid.  The guide pin was placed and the  glenosphere  baseplate was placed.  Very good fixation was achieved with a central locking screw and 4 peripheral screws.  At this time, the glenosphere was placed on to the [facility] taper and secured with a screw.  Attention was then redirected towards the  humerus.  Broaching was performed up to a size 9, which gave  very good press-fit.  The cup was placed and then with the +3 liner, the patient had good stability and good motion.  This was dislocated and the true stem was placed and then repeat reduction  and stability assessment was performed with a +3 and +6.  The +6 actually had slightly more stability, but with good range of motion.  The patient had good stability with forward flexion, adduction and superiorly directed force.  The +6 was chosen and  placed.  Thorough irrigation was then performed.  Axillary nerve palpated and intact.  Vancomycin powder placed into the incision of the joint.  Subscap was then reattached with the arm in about 45 degrees of external rotation.  This was done through  drill holes as well as through holes on the prosthesis.  Further irrigation was performed and then incision was then closed over the deltopectoral interval.  This was done using 0 Vicryl suture followed by interrupted inverted 2-0 Vicryl suture and a 3-0  Monocryl.  The incision for the lipoma off the anterolateral margin of the acromion was fairly close to the other incision.  In order to prevent wound complication breakdown, I elected to delay that cyst excision until the index incision that healed.   The patient was placed into a sling as well as an Aquacel dressing.  He tolerated the procedure well without immediate complications, transferred to the recovery room in stable condition.  TN/NUANCE  D:12/18/2017 T:12/18/2017 JOB:[id number]

## 2017-12-18 NOTE — H&P (Signed)
Willie Fowler is an 67 y.o. male.   Chief Complaint: Left shoulder pain HPI: Willie Fowler is a 67 year old patient with long history of left shoulder pain.  He has failed conservative management including activity modification injection and anti-inflammatory medication.  He also describes a cystic mass on the anterior aspect of the left shoulder.  He describes pain with activities of daily living as well as pain that interferes with his sleep.  MRI scan demonstrates retracted unrepairable tears of the rotator cuff along with a lipoma.  Past Medical History:  Diagnosis Date  . Cancer [facility])    PT DENIES  . Chronic ankle pain   . Chronic back pain   . Chronic left shoulder pain   . GERD (gastroesophageal reflux disease)   . High cholesterol   . History of alcohol abuse   . Hypertension   . Lumbar radiculopathy   . Pain management    [facility]    Past Surgical History:  Procedure Laterality Date  . BACK SURGERY    . TONSILLECTOMY      Family History  Problem Relation Age of Onset  . Cancer Mother        breast  . Cancer Sister        breast   Social History:  reports that he has been smoking cigarettes. He has a 20.00 pack-year smoking history. He has never used smokeless tobacco. He reports that he drinks alcohol. He reports that he does not use drugs.  Allergies: No Known Allergies  Medications Prior to Admission  Medication Sig Dispense Refill  . aspirin EC 81 MG tablet Take 81 mg by mouth daily.    Marland Kitchen ezetimibe (ZETIA) 10 MG tablet Take 10 mg by mouth daily.     Marland Kitchen gabapentin (NEURONTIN) 600 MG tablet Take 1 tablet by mouth 3 (three) times daily.    Marland Kitchen lisinopril (PRINIVIL,ZESTRIL) 40 MG tablet Take 40 mg by mouth daily.    Marland Kitchen omeprazole (PRILOSEC) 20 MG capsule Take 20 mg by mouth daily.    Marland Kitchen oxybutynin (DITROPAN) 5 MG tablet Take 5-15 mg by mouth See admin instructions. Taking 1 tablet (5mg ) in the morning and 3 tablets (15mg ) at bedtime.    . pregabalin (LYRICA) 150 MG capsule Take 150  mg by mouth 3 (three) times daily.     . tamsulosin (FLOMAX) 0.4 MG CAPS capsule Take 0.4 mg by mouth daily.    . traMADol (ULTRAM) 50 MG tablet Take by mouth every 6 (six) hours as needed.    . VENTOLIN HFA 108 (90 BASE) MCG/ACT inhaler Inhale 2 puffs into the lungs every 4 (four) hours as needed.    . verapamil (CALAN-SR) 240 MG CR tablet Take 240 mg by mouth daily.    . cyclobenzaprine (FLEXERIL) 5 MG tablet Take 1 tablet (5 mg total) by mouth 3 (three) times daily as needed for muscle spasms. (Patient not taking: Reported on 12/12/2017) 30 tablet 0  . etodolac (LODINE) 400 MG tablet Take 400 mg by mouth 2 (two) times daily.    . Fluticasone-Salmeterol (ADVAIR) 250-50 MCG/DOSE AEPB Inhale 1 puff into the lungs 2 (two) times daily.    Marland Kitchen lidocaine (XYLOCAINE) 5 % ointment Apply 1 application topically daily.    Marland Kitchen oxybutynin (DITROPAN) 5 MG tablet Take 5-15 mg by mouth 2 (two) times daily.    . Oxycodone HCl 10 MG TABS Take 10 mg by mouth every 6 (six) hours as needed (Pain).      No results  found for this or any previous visit (from the past 48 hour(s)). No results found.  Review of Systems  Musculoskeletal: Positive for joint pain.  All other systems reviewed and are negative.   Blood pressure (!) 223/68, pulse (!) 55, temperature 97.7 F (36.5 C), temperature source Oral, height 5\' 7"  (1.702 m), weight 87.7 kg, SpO2 100 %. Physical Exam  Constitutional: He appears well-developed.  HENT:  Head: Normocephalic.  Eyes: Pupils are equal, round, and reactive to light.  Neck: Normal range of motion.  Cardiovascular: Normal rate.  Respiratory: Effort normal.  Neurological: He is alert.  Skin: Skin is warm.  Psychiatric: He has a normal mood and affect.  Shoulder exam demonstrates intact motor or sensory function to the hand.  Deltoid is functional.  There is a 3 x 4 cm cystic mass anterior aspect of the acromion consistent with lipoma seen on MRI scan.  Patient also has weakness to  infraspinatus and supraspinatus testing on the left-hand side.  Forward flexion and abduction both limited to around 90 degrees or less.  No AC joint tenderness to direct palpation  Assessment/Plan Impression is left shoulder rotator cuff arthropathy as well as lipoma of the anterior deltoid.  Plan is reverse shoulder replacement and lipoma excision.  Risks and benefits discussed including but not limited to infection nerve vessel damage dislocation as well as potential for revision surgery in the future.  Patient understands the risk and benefits.  All questions answered  Anderson Malta, MD 12/18/2017, 7:11 AM

## 2017-12-18 NOTE — Progress Notes (Signed)
Patient has not followed the instructions, and did not take his blood pressure meds since Sunday, 9/15

## 2017-12-18 NOTE — Anesthesia Procedure Notes (Signed)
Procedure Name: Intubation Date/Time: 12/18/2017 7:47 AM Performed by: Raenette Rover, CRNA Pre-anesthesia Checklist: Patient identified, Emergency Drugs available, Suction available and Patient being monitored Patient Re-evaluated:Patient Re-evaluated prior to induction Oxygen Delivery Method: Circle system utilized Preoxygenation: Pre-oxygenation with 100% oxygen Induction Type: IV induction Ventilation: Mask ventilation without difficulty Laryngoscope Size: Mac and 4 Grade View: Grade II Tube type: Oral Tube size: 7.5 mm Number of attempts: 2 (1st attempt went into esophagus; 2nd attempt went into trachea) Airway Equipment and Method: Stylet Placement Confirmation: positive ETCO2,  CO2 detector and breath sounds checked- equal and bilateral Secured at: 23 cm Tube secured with: Tape Dental Injury: Teeth and Oropharynx as per pre-operative assessment

## 2017-12-19 ENCOUNTER — Encounter (HOSPITAL_COMMUNITY): Payer: Self-pay | Admitting: Orthopedic Surgery

## 2017-12-19 NOTE — Progress Notes (Signed)
Patient discharging home today. Discharge instructions explained to patient and he verbalized understanding. Took all personal belongings. No further questions or concerns voiced.  

## 2017-12-19 NOTE — Care Management Note (Signed)
Case Management Note  Patient Details  Name: Willie Fowler MRN: [id number] Date of Birth: [birth year]  Subjective/Objective:      Left TSA              Action/Plan:  NCM spoke to pt and offered choice for [facility]. Pt states he has exercises to do at home. Declines HH PT at this time. Has a cane at home. Wife is at home to assist with care.    Expected Discharge Date:  12/19/17               Expected Discharge Plan:  [place]  In-House Referral:  NA  Discharge planning Services  CM Consult  Post Acute Care Choice:  Home Health Choice offered to:  Patient  DME Arranged:  N/A DME Agency:  NA  HH Arranged:  Patient Refused [place] Agency:  NA  Status of Service:  Completed, signed off  If discussed at [place] of Stay Meetings, dates discussed:    Additional Comments:  Erenest Rasher, RN 12/19/2017, 11:22 AM

## 2017-12-19 NOTE — Progress Notes (Signed)
Patient stable Pain controlled Plan discharged today after occupational therapy Prescriptions on chart

## 2017-12-19 NOTE — Plan of Care (Signed)
  Problem: Safety: Goal: Ability to remain free from injury will improve Outcome: Progressing   Problem: Activity: Goal: Risk for activity intolerance will decrease Outcome: Progressing   Problem: Pain Managment: Goal: General experience of comfort will improve Outcome: Progressing   Problem: Safety: Goal: Ability to remain free from injury will improve Outcome: Progressing

## 2017-12-19 NOTE — Evaluation (Signed)
Occupational Therapy Evaluation Patient Details Name: Willie Fowler MRN: [id number] DOB: 06/11/1950 Today's Date: 12/19/2017    History of Present Illness Left shoulder reverse shoulder replacement    Clinical Impression   OT education complete.  Handout provided.  Pts friend will be taking care of pt.  Proline machine delivered to room prior to OT session.    Follow Up Recommendations  Follow surgeon's recommendation for DC plan and follow-up therapies          Precautions / Restrictions Precautions Precautions: Shoulder Shoulder Interventions: Shoulder sling/immobilizer Precaution Comments: FF 0-90, Abduction 0-60, ER 0-30. Pt also has Proline in room per Dr Marlou Sa. Pt states he has been educated on proline machine      Mobility Bed Mobility Overal bed mobility: Independent                Transfers Overall transfer level: Independent                    Balance Overall balance assessment: Independent                                         ADL either performed or assessed with clinical judgement         Vision Patient Visual Report: No change from baseline              Pertinent Vitals/Pain Pain Assessment: No/denies pain           Communication Communication Communication: No difficulties   Cognition Arousal/Alertness: Awake/alert Behavior During Therapy: WFL for tasks assessed/performed Overall Cognitive Status: Within Functional Limits for tasks assessed                                        Exercises Shoulder Exercises Pendulum Exercise: AAROM;Left Elbow Flexion: AROM;AAROM;Left Elbow Extension: AAROM;AROM;Left Wrist Flexion: AROM;Left Wrist Extension: AROM;Left Digit Composite Flexion: AROM;Left Composite Extension: AROM;Left   Shoulder Instructions Shoulder Instructions Donning/doffing shirt without moving shoulder: Minimal assistance;Caregiver independent with task Method for sponge bathing  under operated UE: Minimal assistance;Caregiver independent with task Donning/doffing sling/immobilizer: Minimal assistance;Caregiver independent with task Correct positioning of sling/immobilizer: Minimal assistance;Caregiver independent with task Pendulum exercises (written home exercise program): Independent;Caregiver independent with task ROM for elbow, wrist and digits of operated UE: Independent;Caregiver independent with task Sling wearing schedule (on at all times/off for ADL's): Independent Proper positioning of operated UE when showering: Independent Positioning of UE while sleeping: Minimal assistance;Caregiver independent with task    Home Living Family/patient expects to be discharged to:: Private residence Living Arrangements: Alone   Type of Home: Mobile home Home Access: Stairs to enter   Entrance Stairs-Rails: Can reach both;Left;Right           Bathroom Toilet: Standard     Home Equipment: None          Prior Functioning/Environment Level of Independence: Independent                          OT Goals(Current goals can be found in the care plan section) Acute Rehab OT Goals Patient Stated Goal: home today OT Goal Formulation: With patient  OT Frequency:      AM-PAC PT "6 Clicks" Daily Activity     Outcome Measure Help from another person  eating meals?: None Help from another person taking care of personal grooming?: A Little Help from another person toileting, which includes using toliet, bedpan, or urinal?: A Little Help from another person bathing (including washing, rinsing, drying)?: A Little Help from another person to put on and taking off regular upper body clothing?: A Little Help from another person to put on and taking off regular lower body clothing?: A Little 6 Click Score: 19   End of Session Nurse Communication: Mobility status  Activity Tolerance: Patient tolerated treatment well Patient left: with call bell/phone within  reach;with family/visitor present                   Time: 2706-2376 OT Time Calculation (min): 24 min Charges:  OT Evaluation $OT Eval Moderate Complexity: 1 Mod OT Treatments $Self Care/Home Management : 8-22 mins  Kari Baars, OT Acute Rehabilitation Services [phone] Office- [place], Edwena Felty D 12/19/2017, 10:26 AM

## 2017-12-25 DIAGNOSIS — M12812 Other specific arthropathies, not elsewhere classified, left shoulder: Secondary | ICD-10-CM

## 2017-12-29 NOTE — Discharge Summary (Signed)
Physician Discharge Summary  Patient ID: Willie Fowler MRN: [id number] DOB/AGE: 1950/10/03 67 y.o.  Admit date: 12/18/2017 Discharge date: 12/19/2017  Admission Diagnoses:  Active Problems:   Shoulder arthritis   Rotator cuff arthropathy, left   Discharge Diagnoses:  Same  Surgeries: Procedure(s): LEFT REVERSE SHOULDER ARTHROPLASTY EXCISION CYST LEFT SHOULDER on 12/18/2017   Consultants:   Discharged Condition: Stable  Hospital Course: Willie Fowler is an 67 y.o. male who was admitted 12/18/2017 with a chief complaint of left shoulder pain, and found to have a diagnosis of left shoulder rotator cuff arthropathy.  They were brought to the operating room on 12/18/2017 and underwent the above named procedures.  Patient did well with the procedure.  He had a lipoma in the area as well but the incision was going to be too close to the rotator cuff arthropathy replacement incision and thus that will be deferred.  He was having good pain control and mobilizing well with physical therapy on postop day #1.  He is discharged home in good condition and will follow-up with me in 10 days.  Antibiotics given:  Anti-infectives (From admission, onward)   Start     Dose/Rate Route Frequency Ordered Stop   12/18/17 1400  ceFAZolin (ANCEF) IVPB 2g/100 mL premix     2 g 200 mL/hr over 30 Minutes Intravenous Every 6 hours 12/18/17 1231 12/19/17 0400   12/18/17 1039  vancomycin (VANCOCIN) powder  Status:  Discontinued       As needed 12/18/17 1039 12/18/17 1133   12/17/17 0600  ceFAZolin (ANCEF) IVPB 2g/100 mL premix  Status:  Discontinued     2 g 200 mL/hr over 30 Minutes Intravenous On call to O.R. 12/17/17 0515 12/18/17 0559    .  Recent vital signs:  Vitals:   12/19/17 0837 12/19/17 0939  BP: (!) 123/56   Pulse: 88 70  Resp: 18 18  Temp: 97.7 F (36.5 C)   SpO2: 92% 94%    Recent laboratory studies:  Results for orders placed or performed during the hospital encounter of 12/12/17   Urine culture  Result Value Ref Range   Specimen Description URINE, CLEAN CATCH    Special Requests NONE    Culture      NO GROWTH Performed at [facility], [place] [address] [place], [place] [address]    Report Status 12/13/2017 FINAL   Surgical pcr screen  Result Value Ref Range   MRSA, PCR NEGATIVE NEGATIVE   Staphylococcus aureus POSITIVE (A) NEGATIVE  Basic metabolic panel  Result Value Ref Range   Sodium 141 135 - 145 mmol/L   Potassium 4.2 3.5 - 5.1 mmol/L   Chloride 103 98 - 111 mmol/L   CO2 27 22 - 32 mmol/L   Glucose, Bld 126 (H) 70 - 99 mg/dL   BUN 13 8 - 23 mg/dL   Creatinine, Ser 1.14 0.61 - 1.24 mg/dL   Calcium 9.4 8.9 - 10.3 mg/dL   GFR calc non Af Amer >60 >60 mL/min   GFR calc Af Amer >60 >60 mL/min   Anion gap 11 5 - 15  CBC  Result Value Ref Range   WBC 11.1 (H) 4.0 - 10.5 K/uL   RBC 4.91 4.22 - 5.81 MIL/uL   Hemoglobin 14.5 13.0 - 17.0 g/dL   HCT 45.1 39.0 - 52.0 %   MCV 91.9 78.0 - 100.0 fL   MCH 29.5 26.0 - 34.0 pg   MCHC 32.2 30.0 - 36.0 g/dL  RDW 14.2 11.5 - 15.5 %   Platelets 260 150 - 400 K/uL  Protime-INR  Result Value Ref Range   Prothrombin Time 12.7 11.4 - 15.2 seconds   INR 0.96   Urinalysis, Routine w reflex microscopic  Result Value Ref Range   Color, Urine YELLOW YELLOW   APPearance CLEAR CLEAR   Specific Gravity, Urine 1.010 1.005 - 1.030   pH 5.0 5.0 - 8.0   Glucose, UA NEGATIVE NEGATIVE mg/dL   Hgb urine dipstick NEGATIVE NEGATIVE   Bilirubin Urine NEGATIVE NEGATIVE   Ketones, ur NEGATIVE NEGATIVE mg/dL   Protein, ur NEGATIVE NEGATIVE mg/dL   Nitrite NEGATIVE NEGATIVE   Leukocytes, UA NEGATIVE NEGATIVE    Discharge Medications:   Allergies as of 12/19/2017   No Known Allergies     Medication List    STOP taking these medications   cyclobenzaprine 5 MG tablet Commonly known as:  FLEXERIL   Oxycodone HCl 10 MG Tabs     TAKE these medications   aspirin EC 81 MG tablet Take 81 mg by mouth daily.    etodolac 400 MG tablet Commonly known as:  LODINE Take 400 mg by mouth 2 (two) times daily.   FLOMAX 0.4 MG Caps capsule Generic drug:  tamsulosin Take 0.4 mg by mouth daily.   Fluticasone-Salmeterol 250-50 MCG/DOSE Aepb Commonly known as:  ADVAIR Inhale 1 puff into the lungs 2 (two) times daily.   gabapentin 600 MG tablet Commonly known as:  NEURONTIN Take 1 tablet by mouth 3 (three) times daily.   lidocaine 5 % ointment Commonly known as:  XYLOCAINE Apply 1 application topically daily.   lisinopril 40 MG tablet Commonly known as:  PRINIVIL,ZESTRIL Take 40 mg by mouth daily.   omeprazole 20 MG capsule Commonly known as:  PRILOSEC Take 20 mg by mouth daily.   oxybutynin 5 MG tablet Commonly known as:  DITROPAN Take 5-15 mg by mouth 2 (two) times daily.   oxybutynin 5 MG tablet Commonly known as:  DITROPAN Take 5-15 mg by mouth See admin instructions. Taking 1 tablet (5mg ) in the morning and 3 tablets (15mg ) at bedtime.   pregabalin 150 MG capsule Commonly known as:  LYRICA Take 150 mg by mouth 3 (three) times daily.   traMADol 50 MG tablet Commonly known as:  ULTRAM Take by mouth every 6 (six) hours as needed.   VENTOLIN HFA 108 (90 Base) MCG/ACT inhaler Generic drug:  albuterol Inhale 2 puffs into the lungs every 4 (four) hours as needed.   verapamil 240 MG CR tablet Commonly known as:  CALAN-SR Take 240 mg by mouth daily.   ZETIA 10 MG tablet Generic drug:  ezetimibe Take 10 mg by mouth daily.       Diagnostic Studies: Dg Shoulder Left Port  Result Date: 12/18/2017 CLINICAL DATA:  Left shoulder arthroplasty. EXAM: LEFT SHOULDER - 1 VIEW COMPARISON:  CT 11/07/2017. FINDINGS: Total left shoulder replacement. Hardware intact. Anatomic alignment. No acute bony abnormality. Left base subsegmental atelectasis. Left base pleural thickening. IMPRESSION: Total left shoulder replacement with anatomic alignment. Electronically Signed   By: Marcello Moores  Register   On:  12/18/2017 11:56    Disposition:   Discharge Instructions    Call MD / Call 911   Complete by:  As directed    If you experience chest pain or shortness of breath, CALL 911 and be transported to the hospital emergency room.  If you develope a fever above 101 F, pus (white drainage) or increased drainage  or redness at the wound, or calf pain, call your surgeon's office.   Constipation Prevention   Complete by:  As directed    Drink plenty of fluids.  Prune juice may be helpful.  You may use a stool softener, such as Colace (over the counter) 100 mg twice a day.  Use MiraLax (over the counter) for constipation as needed.   Diet - low sodium heart healthy   Complete by:  As directed    Discharge instructions   Complete by:  As directed    No lifting with left arm Stay in the sling when not doing exercises with the CPM machine Okay to shower dressing waterproof   Increase activity slowly as tolerated   Complete by:  As directed          Signed: Anderson Malta 12/29/2017, 9:43 AM

## 2018-01-02 ENCOUNTER — Ambulatory Visit (INDEPENDENT_AMBULATORY_CARE_PROVIDER_SITE_OTHER): Payer: Medicare Other | Admitting: Orthopedic Surgery

## 2018-01-02 ENCOUNTER — Encounter (INDEPENDENT_AMBULATORY_CARE_PROVIDER_SITE_OTHER): Payer: Self-pay | Admitting: Orthopedic Surgery

## 2018-01-02 DIAGNOSIS — M12812 Other specific arthropathies, not elsewhere classified, left shoulder: Secondary | ICD-10-CM

## 2018-01-02 MED ORDER — METHOCARBAMOL 500 MG PO TABS: 500.0000 mg | ORAL_TABLET | Freq: Three times a day (TID) | ORAL | 0 refills | Status: DC | PRN

## 2018-01-02 MED ORDER — OXYCODONE HCL 5 MG PO TABS: ORAL_TABLET | ORAL | 0 refills | Status: DC

## 2018-01-02 NOTE — Progress Notes (Signed)
Post-Op Visit Note   Patient: Willie Fowler           Date of Birth: 08-Mar-1951           MRN: [id number] Visit Date: 01/02/2018 PCP: Abran Richard, MD   Assessment & Plan:  Chief Complaint:  Chief Complaint  Patient presents with  . Left Shoulder - Routine Post Op   Visit Diagnoses:  1. Rotator cuff arthropathy, left     Plan: Carden is a patient with left reverse shoulder replacement performed 12/18/2017.  He is been doing well.  He is been using the CPM machine only about 15 minutes twice a day.  Ran out of oxycodone several days ago.  On exam he is had pretty reasonable passive range of motion but I would like for him to have more.  Discontinue sling use and start physical therapy.  Bump up CPM to the recommended amount 1 hour minimum 3 times a day.  Oxycodone and Robaxin refilled.  I will see him back in 4 weeks for clinical recheck.  No lifting with that left arm more than 5 pounds until I see him back  Follow-Up Instructions: Return in about 4 weeks (around 01/30/2018).   Orders:  No orders of the defined types were placed in this encounter.  Meds ordered this encounter  Medications  . oxyCODONE (OXY IR/ROXICODONE) 5 MG immediate release tablet    Sig: 1 po q 8 hrs prn pain    Dispense:  50 tablet    Refill:  0  . methocarbamol (ROBAXIN) 500 MG tablet    Sig: Take 1 tablet (500 mg total) by mouth every 8 (eight) hours as needed for muscle spasms.    Dispense:  30 tablet    Refill:  0    Imaging: No results found.  PMFS History: Patient Active Problem List   Diagnosis Date Noted  . Rotator cuff arthropathy, left   . Shoulder arthritis 12/18/2017  . Pneumonia 01/14/2017  . LLL pneumonia ([place]) 01/14/2017  . Acute respiratory distress 01/14/2017  . Acute respiratory failure with hypoxia ([place]) 01/14/2017  . Chronic back pain 01/14/2017  . Opioid dependence ([place]) 01/14/2017  . High cholesterol 01/14/2017  . Current every day smoker 01/14/2017  . Leukocytosis  01/14/2017  . Constipation due to opioid therapy 01/14/2017  . Hypertension 01/14/2017  . Hyponatremia 01/14/2017  . Dehydration 01/14/2017   Past Medical History:  Diagnosis Date  . Cancer [facility])    PT DENIES  . Chronic ankle pain   . Chronic back pain   . Chronic left shoulder pain   . GERD (gastroesophageal reflux disease)   . High cholesterol   . History of alcohol abuse   . Hypertension   . Lumbar radiculopathy   . Pain management    [facility]    Family History  Problem Relation Age of Onset  . Cancer Mother        breast  . Cancer Sister        breast    Past Surgical History:  Procedure Laterality Date  . BACK SURGERY    . EAR CYST EXCISION Left 12/18/2017   Procedure: EXCISION CYST LEFT SHOULDER;  Surgeon: Meredith Pel, MD;  Location: [place];  Service: Orthopedics;  Laterality: Left;  . REVERSE SHOULDER ARTHROPLASTY Left 12/18/2017   Procedure: LEFT REVERSE SHOULDER ARTHROPLASTY;  Surgeon: Meredith Pel, MD;  Location: [place];  Service: Orthopedics;  Laterality: Left;  . TONSILLECTOMY  Social History   Occupational History  . Not on file  Tobacco Use  . Smoking status: Current Every Day Smoker    Packs/day: 0.50    Years: 40.00    Pack years: 20.00    Types: Cigarettes  . Smokeless tobacco: Never Used  Substance and Sexual Activity  . Alcohol use: Yes    Comment: weekly-  BEER   . Drug use: No  . Sexual activity: Not on file

## 2018-01-03 ENCOUNTER — Telehealth (INDEPENDENT_AMBULATORY_CARE_PROVIDER_SITE_OTHER): Payer: Self-pay | Admitting: Orthopedic Surgery

## 2018-01-03 NOTE — Telephone Encounter (Signed)
01/02/18 ov note faxed to [facility] physical therapy [phone]

## 2018-01-10 ENCOUNTER — Encounter: Admit: 2018-01-10 | Discharge: 2018-01-10 | Payer: MEDICARE

## 2018-01-10 ENCOUNTER — Encounter: Admit: 2018-01-10 | Discharge: 2018-01-10 | Payer: MEDICARE | Attending: Medical Oncology | Primary: Medical Oncology

## 2018-01-10 DIAGNOSIS — C7951 Secondary malignant neoplasm of bone: Secondary | ICD-10-CM

## 2018-01-10 DIAGNOSIS — C61 Malignant neoplasm of prostate: Principal | ICD-10-CM

## 2018-01-10 MED ORDER — METHYLPHENIDATE 10 MG TABLET
ORAL_TABLET | Freq: Every day | ORAL | 0 refills | 0.00000 days | Status: CP
Start: 2018-01-10 — End: 2018-04-25

## 2018-01-17 ENCOUNTER — Encounter: Admit: 2018-01-17 | Discharge: 2018-01-18 | Payer: MEDICARE | Attending: Urology | Primary: Urology

## 2018-01-17 DIAGNOSIS — N529 Male erectile dysfunction, unspecified: Principal | ICD-10-CM

## 2018-01-17 DIAGNOSIS — M5416 Radiculopathy, lumbar region: Secondary | ICD-10-CM

## 2018-02-18 ENCOUNTER — Encounter: Payer: Self-pay | Admitting: Internal Medicine

## 2018-02-20 ENCOUNTER — Telehealth (INDEPENDENT_AMBULATORY_CARE_PROVIDER_SITE_OTHER): Payer: Self-pay | Admitting: Orthopedic Surgery

## 2018-02-20 NOTE — Telephone Encounter (Signed)
Appointment reminder faxed to [facility] services for transportation. Fax [phone]

## 2018-02-27 ENCOUNTER — Encounter (INDEPENDENT_AMBULATORY_CARE_PROVIDER_SITE_OTHER): Payer: Self-pay | Admitting: Orthopedic Surgery

## 2018-02-27 ENCOUNTER — Ambulatory Visit (INDEPENDENT_AMBULATORY_CARE_PROVIDER_SITE_OTHER): Payer: Medicare Other | Admitting: Orthopedic Surgery

## 2018-02-27 DIAGNOSIS — Z96612 Presence of left artificial shoulder joint: Secondary | ICD-10-CM

## 2018-02-27 MED ORDER — OXYCODONE HCL 5 MG PO TABS: ORAL_TABLET | ORAL | 0 refills | Status: DC

## 2018-02-28 ENCOUNTER — Encounter (INDEPENDENT_AMBULATORY_CARE_PROVIDER_SITE_OTHER): Payer: Self-pay | Admitting: Orthopedic Surgery

## 2018-02-28 NOTE — Progress Notes (Signed)
   Post-Op Visit Note   Patient: Willie Fowler           Date of Birth: 1950/06/17           MRN: [id number] Visit Date: 02/27/2018 PCP: Abran Richard, MD   Assessment & Plan:  Chief Complaint:  Chief Complaint  Patient presents with  . Left Shoulder - Routine Post Op   Visit Diagnoses:  1. Hx of total shoulder replacement, left     Plan: Willie Fowler is a patient who is postop left reverse shoulder replacement 12/18/2017.  Is in therapy 2 times a week.  Taking oxycodone for pain.  On examination he has excellent range of motion forward flexion and abduction both easily above 90 degrees.  Strength is improving.  Plan at this time is to continue therapy.  Oxycodone refilled x1 only.  8-week return for final check.  Follow-Up Instructions: Return in about 8 weeks (around 04/24/2018).   Orders:  No orders of the defined types were placed in this encounter.  Meds ordered this encounter  Medications  . oxyCODONE (OXY IR/ROXICODONE) 5 MG immediate release tablet    Sig: 1 po q 12 hrs prn pain    Dispense:  30 tablet    Refill:  0    Imaging: No results found.  PMFS History: Patient Active Problem List   Diagnosis Date Noted  . Rotator cuff arthropathy, left   . Shoulder arthritis 12/18/2017  . Pneumonia 01/14/2017  . LLL pneumonia ([place]) 01/14/2017  . Acute respiratory distress 01/14/2017  . Acute respiratory failure with hypoxia ([place]) 01/14/2017  . Chronic back pain 01/14/2017  . Opioid dependence ([place]) 01/14/2017  . High cholesterol 01/14/2017  . Current every day smoker 01/14/2017  . Leukocytosis 01/14/2017  . Constipation due to opioid therapy 01/14/2017  . Hypertension 01/14/2017  . Hyponatremia 01/14/2017  . Dehydration 01/14/2017   Past Medical History:  Diagnosis Date  . Cancer [facility])    PT DENIES  . Chronic ankle pain   . Chronic back pain   . Chronic left shoulder pain   . GERD (gastroesophageal reflux disease)   . High cholesterol   . History of alcohol  abuse   . Hypertension   . Lumbar radiculopathy   . Pain management    [facility]    Family History  Problem Relation Age of Onset  . Cancer Mother        breast  . Cancer Sister        breast    Past Surgical History:  Procedure Laterality Date  . BACK SURGERY    . EAR CYST EXCISION Left 12/18/2017   Procedure: EXCISION CYST LEFT SHOULDER;  Surgeon: Willie Pel, MD;  Location: [place];  Service: Orthopedics;  Laterality: Left;  . REVERSE SHOULDER ARTHROPLASTY Left 12/18/2017   Procedure: LEFT REVERSE SHOULDER ARTHROPLASTY;  Surgeon: Willie Pel, MD;  Location: [place];  Service: Orthopedics;  Laterality: Left;  . TONSILLECTOMY     Social History   Occupational History  . Not on file  Tobacco Use  . Smoking status: Current Every Day Smoker    Packs/day: 0.50    Years: 40.00    Pack years: 20.00    Types: Cigarettes  . Smokeless tobacco: Never Used  Substance and Sexual Activity  . Alcohol use: Yes    Comment: weekly-  BEER   . Drug use: No  . Sexual activity: Not on file

## 2018-04-05 ENCOUNTER — Other Ambulatory Visit (INDEPENDENT_AMBULATORY_CARE_PROVIDER_SITE_OTHER): Payer: Self-pay

## 2018-04-05 ENCOUNTER — Ambulatory Visit (INDEPENDENT_AMBULATORY_CARE_PROVIDER_SITE_OTHER): Payer: Medicare Other | Admitting: Orthopedic Surgery

## 2018-04-05 DIAGNOSIS — Z96612 Presence of left artificial shoulder joint: Secondary | ICD-10-CM

## 2018-04-05 MED ORDER — TRAMADOL HCL 50 MG PO TABS: 50.0000 mg | ORAL_TABLET | Freq: Every day | ORAL | 0 refills | Status: DC | PRN

## 2018-04-05 MED ORDER — OXYCODONE HCL 5 MG PO TABS: 5.0000 mg | ORAL_TABLET | Freq: Every day | ORAL | 0 refills | Status: DC | PRN

## 2018-04-07 ENCOUNTER — Encounter (INDEPENDENT_AMBULATORY_CARE_PROVIDER_SITE_OTHER): Payer: Self-pay | Admitting: Orthopedic Surgery

## 2018-04-07 NOTE — Progress Notes (Signed)
Office Visit Note   Patient: Willie Fowler           Date of Birth: 27-Nov-1950           MRN: [id number] Visit Date: 04/05/2018 Requested by: Abran Richard, MD [address] [place], [place] [address] PCP: Abran Richard, MD  Subjective: Chief Complaint  Patient presents with  . Left Shoulder - Follow-up    HPI: Willie Fowler is a patient who is now about 4 months out left reverse shoulder replacement.  Is gotten better.  He still is having some pain.  Is going to physical therapy.  He is trying to taper off of pain medicine.  Using ice and heat.  Requesting more oxycodone today.              ROS: All systems reviewed are negative as they relate to the chief complaint within the history of present illness.  Patient denies  fevers or chills.   Assessment & Plan: Visit Diagnoses:  1. Hx of total shoulder replacement, left     Plan: Impression is well-functioning left shoulder replacement.  Continue with physical therapy and taper to home exercise program.  Okay to do lifting less than or equal to 15 pounds at this time.  Oxycodone 1/day prescribed #15 with no refills and then tramadol to be taken after that 1/day as needed for pain and then he should transition to Tylenol and anti-inflammatories.  Follow-up with me as needed.  Follow-Up Instructions: Return if symptoms worsen or fail to improve.   Orders:  No orders of the defined types were placed in this encounter.  No orders of the defined types were placed in this encounter.     Procedures: No procedures performed   Clinical Data: No additional findings.  Objective: Vital Signs: There were no vitals taken for this visit.  Physical Exam:   Constitutional: Patient appears well-developed HEENT:  Head: Normocephalic Eyes:EOM are normal Neck: Normal range of motion Cardiovascular: Normal rate Pulmonary/chest: Effort normal Neurologic: Patient is alert Skin: Skin is warm Psychiatric: Patient has normal mood and  affect    Ortho Exam: Ortho exam demonstrates full active and passive range of motion of the right shoulder.  On the left-hand side he has forward flexion and abduction both above 90 degrees.  Strength is actually good to forward flexion and abduction.  Incision is intact.  No warmth to the shoulder no effusion.  Specialty Comments:  No specialty comments available.  Imaging: No results found.   PMFS History: Patient Active Problem List   Diagnosis Date Noted  . Rotator cuff arthropathy, left   . Shoulder arthritis 12/18/2017  . Pneumonia 01/14/2017  . LLL pneumonia ([place]) 01/14/2017  . Acute respiratory distress 01/14/2017  . Acute respiratory failure with hypoxia ([place]) 01/14/2017  . Chronic back pain 01/14/2017  . Opioid dependence ([place]) 01/14/2017  . High cholesterol 01/14/2017  . Current every day smoker 01/14/2017  . Leukocytosis 01/14/2017  . Constipation due to opioid therapy 01/14/2017  . Hypertension 01/14/2017  . Hyponatremia 01/14/2017  . Dehydration 01/14/2017   Past Medical History:  Diagnosis Date  . Cancer [facility])    PT DENIES  . Chronic ankle pain   . Chronic back pain   . Chronic left shoulder pain   . GERD (gastroesophageal reflux disease)   . High cholesterol   . History of alcohol abuse   . Hypertension   . Lumbar radiculopathy   . Pain management    [facility]  Family History  Problem Relation Age of Onset  . Cancer Mother        breast  . Cancer Sister        breast    Past Surgical History:  Procedure Laterality Date  . BACK SURGERY    . EAR CYST EXCISION Left 12/18/2017   Procedure: EXCISION CYST LEFT SHOULDER;  Surgeon: Meredith Pel, MD;  Location: [place];  Service: Orthopedics;  Laterality: Left;  . REVERSE SHOULDER ARTHROPLASTY Left 12/18/2017   Procedure: LEFT REVERSE SHOULDER ARTHROPLASTY;  Surgeon: Meredith Pel, MD;  Location: [place];  Service: Orthopedics;  Laterality: Left;  . TONSILLECTOMY     Social History    Occupational History  . Not on file  Tobacco Use  . Smoking status: Current Every Day Smoker    Packs/day: 0.50    Years: 40.00    Pack years: 20.00    Types: Cigarettes  . Smokeless tobacco: Never Used  Substance and Sexual Activity  . Alcohol use: Yes    Comment: weekly-  BEER   . Drug use: No  . Sexual activity: Not on file

## 2018-04-20 ENCOUNTER — Encounter: Admit: 2018-04-20 | Discharge: 2018-04-21 | Payer: MEDICARE

## 2018-04-20 DIAGNOSIS — C61 Malignant neoplasm of prostate: Principal | ICD-10-CM

## 2018-04-20 DIAGNOSIS — C7951 Secondary malignant neoplasm of bone: Secondary | ICD-10-CM

## 2018-04-22 ENCOUNTER — Emergency Department (HOSPITAL_COMMUNITY)
Admission: EM | Admit: 2018-04-22 | Discharge: 2018-04-22 | Disposition: A | Discharge status: Home / Self Care | Payer: Medicare Other | Attending: Emergency Medicine | Admitting: Emergency Medicine

## 2018-04-22 ENCOUNTER — Other Ambulatory Visit: Payer: Self-pay

## 2018-04-22 ENCOUNTER — Emergency Department (HOSPITAL_COMMUNITY): Payer: Medicare Other

## 2018-04-22 ENCOUNTER — Encounter (HOSPITAL_COMMUNITY): Payer: Self-pay

## 2018-04-22 DIAGNOSIS — Z79899 Other long term (current) drug therapy: Secondary | ICD-10-CM | POA: Diagnosis not present

## 2018-04-22 DIAGNOSIS — F17210 Nicotine dependence, cigarettes, uncomplicated: Secondary | ICD-10-CM | POA: Diagnosis not present

## 2018-04-22 DIAGNOSIS — Z7982 Long term (current) use of aspirin: Secondary | ICD-10-CM | POA: Insufficient documentation

## 2018-04-22 DIAGNOSIS — I10 Essential (primary) hypertension: Secondary | ICD-10-CM | POA: Insufficient documentation

## 2018-04-22 DIAGNOSIS — R05 Cough: Secondary | ICD-10-CM | POA: Diagnosis present

## 2018-04-22 DIAGNOSIS — M79642 Pain in left hand: Secondary | ICD-10-CM | POA: Diagnosis not present

## 2018-04-22 DIAGNOSIS — J189 Pneumonia, unspecified organism: Secondary | ICD-10-CM | POA: Insufficient documentation

## 2018-04-22 MED ORDER — CEFTRIAXONE SODIUM 1 G IJ SOLR
1.0000 g | Freq: Once | INTRAMUSCULAR | Status: AC
  Administered 2018-04-22: 1 g via INTRAMUSCULAR
  Filled 2018-04-22: qty 10

## 2018-04-22 MED ORDER — AZITHROMYCIN 250 MG PO TABS: ORAL_TABLET | ORAL | 0 refills | Status: DC

## 2018-04-22 MED ORDER — LIDOCAINE HCL (PF) 2 % IJ SOLN
INTRAMUSCULAR | Status: AC
  Administered 2018-04-22: 5 mL
  Filled 2018-04-22: qty 10

## 2018-04-22 MED ORDER — BENZONATATE 100 MG PO CAPS
200.0000 mg | ORAL_CAPSULE | Freq: Once | ORAL | Status: AC
  Administered 2018-04-22: 200 mg via ORAL
  Filled 2018-04-22: qty 2

## 2018-04-22 MED ORDER — BENZONATATE 100 MG PO CAPS: 200.0000 mg | ORAL_CAPSULE | Freq: Three times a day (TID) | ORAL | 0 refills | Status: DC | PRN

## 2018-04-22 NOTE — Discharge Instructions (Signed)
As discussed, your xray suggests a very early pneumonia and you have received antibiotics here, but will also need to complete the zithromax prescription.  Follow up with your doctor as outlined above.  The x-ray of your hand and wrist are negative for any obvious source of your wrist and hand pain, although your exam suggest that you have a tendinitis especially of your thumb tendons.  Wear the splint we provided for comfort.  You may also try using a heating pad and elevation which may help with pain as well.  Plan to call Dr. Marlou Sa for further evaluation if your hand pain persists despite this treatment.

## 2018-04-22 NOTE — ED Provider Notes (Signed)
[facility] EMERGENCY DEPARTMENT Provider Note   CSN: [id number] Arrival date & time: 04/22/18  0830     History   Chief Complaint Chief Complaint  Patient presents with  . Cough  . Hand Pain    HPI Willie Fowler is a 68 y.o. male, right-handed, presenting with 2 complaints, the first being left hand pain without injury.  He describes gradual onset of pain at his left wrist radiating to his thumb and dorsal hand which started 4 days ago.  He denies any injuries or overuse.  He is retired, denies any hobbies that require hand strength for repetitive motions.  He denies weakness or numbness in his fingertips.  Pain is worsened with movement including extension and flexion at the wrist but also with attempts at making a fist, especially radiating into his thumb.  He denies swelling or redness at the site, no injuries as mentioned, no history of gout.  Secondly he also reports cough which has been productive of a yellow sputum also x4 days.  He generally feels fatigued, denies chest pain or shortness of breath, but suspects he may have the flu.  He denies generalized body aches, also no abdominal pain, nausea or vomiting.  He has taken an OTC NyQuil type cough and cold medication this morning before arrival without any improvement in his symptoms.  The history is provided by the patient.    Past Medical History:  Diagnosis Date  . Cancer [facility])    PT DENIES  . Chronic ankle pain   . Chronic back pain   . Chronic left shoulder pain   . GERD (gastroesophageal reflux disease)   . High cholesterol   . History of alcohol abuse   . Hypertension   . Lumbar radiculopathy   . Pain management    [facility]    Patient Active Problem List   Diagnosis Date Noted  . Rotator cuff arthropathy, left   . Shoulder arthritis 12/18/2017  . Pneumonia 01/14/2017  . LLL pneumonia ([place]) 01/14/2017  . Acute respiratory distress 01/14/2017  . Acute respiratory failure with hypoxia ([place]) 01/14/2017  .  Chronic back pain 01/14/2017  . Opioid dependence ([place]) 01/14/2017  . High cholesterol 01/14/2017  . Current every day smoker 01/14/2017  . Leukocytosis 01/14/2017  . Constipation due to opioid therapy 01/14/2017  . Hypertension 01/14/2017  . Hyponatremia 01/14/2017  . Dehydration 01/14/2017    Past Surgical History:  Procedure Laterality Date  . BACK SURGERY    . EAR CYST EXCISION Left 12/18/2017   Procedure: EXCISION CYST LEFT SHOULDER;  Surgeon: Meredith Pel, MD;  Location: [place];  Service: Orthopedics;  Laterality: Left;  . REVERSE SHOULDER ARTHROPLASTY Left 12/18/2017   Procedure: LEFT REVERSE SHOULDER ARTHROPLASTY;  Surgeon: Meredith Pel, MD;  Location: [place];  Service: Orthopedics;  Laterality: Left;  . TONSILLECTOMY          Home Medications    Prior to Admission medications   Medication Sig Start Date End Date Taking? Authorizing Provider  aspirin EC 81 MG tablet Take 81 mg by mouth daily.    [provider]  azithromycin (ZITHROMAX Z-PAK) 250 MG tablet Take 2 tablets by mouth on day one followed by one tablet daily for 4 days. 04/22/18   Evalee Jefferson, PA-C  benzonatate (TESSALON) 100 MG capsule Take 2 capsules (200 mg total) by mouth 3 (three) times daily as needed. 04/22/18   Evalee Jefferson, PA-C  etodolac (LODINE) 400 MG tablet Take 400 mg  by mouth 2 (two) times daily.    [provider]  ezetimibe (ZETIA) 10 MG tablet Take 10 mg by mouth daily.  08/18/11   [provider]  Fluticasone-Salmeterol (ADVAIR) 250-50 MCG/DOSE AEPB Inhale 1 puff into the lungs 2 (two) times daily. 06/25/17   [provider]  gabapentin (NEURONTIN) 600 MG tablet Take 1 tablet by mouth 3 (three) times daily. 12/19/16   [provider]  lidocaine (XYLOCAINE) 5 % ointment Apply 1 application topically daily. 02/28/15   [provider]  lisinopril (PRINIVIL,ZESTRIL) 40 MG tablet Take 40 mg by mouth daily. 12/25/14   [provider]    methocarbamol (ROBAXIN) 500 MG tablet Take 1 tablet (500 mg total) by mouth every 8 (eight) hours as needed for muscle spasms. 01/02/18   Meredith Pel, MD  omeprazole (PRILOSEC) 20 MG capsule Take 20 mg by mouth daily.    [provider]  oxybutynin (DITROPAN) 5 MG tablet Take 5-15 mg by mouth 2 (two) times daily.    [provider]  oxybutynin (DITROPAN) 5 MG tablet Take 5-15 mg by mouth See admin instructions. Taking 1 tablet (5mg ) in the morning and 3 tablets (15mg ) at bedtime.    [provider]  oxyCODONE (OXY IR/ROXICODONE) 5 MG immediate release tablet Take 1 tablet (5 mg total) by mouth daily as needed for severe pain. 1 po q 12 hrs prn pain 04/05/18   Meredith Pel, MD  pregabalin (LYRICA) 150 MG capsule Take 150 mg by mouth 3 (three) times daily.     [provider]  tamsulosin (FLOMAX) 0.4 MG CAPS capsule Take 0.4 mg by mouth daily. 04/19/12   [provider]  traMADol (ULTRAM) 50 MG tablet Take by mouth every 6 (six) hours as needed.    [provider]  traMADol (ULTRAM) 50 MG tablet Take 1 tablet (50 mg total) by mouth daily as needed. 04/05/18   Meredith Pel, MD  VENTOLIN HFA 108 (90 BASE) MCG/ACT inhaler Inhale 2 puffs into the lungs every 4 (four) hours as needed. 03/16/15   [provider]  verapamil (CALAN-SR) 240 MG CR tablet Take 240 mg by mouth daily. 11/19/08   [provider]    Family History Family History  Problem Relation Age of Onset  . Cancer Mother        breast  . Cancer Sister        breast    Social History Social History   Tobacco Use  . Smoking status: Current Every Day Smoker    Packs/day: 0.50    Years: 40.00    Pack years: 20.00    Types: Cigarettes  . Smokeless tobacco: Never Used  Substance Use Topics  . Alcohol use: Yes    Comment: weekly-  BEER   . Drug use: No     Allergies   Patient has no known allergies.   Review of Systems Review of Systems   Constitutional: Negative for chills and fever.  HENT: Negative for congestion, ear pain, rhinorrhea, sinus pressure, sore throat, trouble swallowing and voice change.   Eyes: Negative for discharge.  Respiratory: Positive for cough. Negative for shortness of breath, wheezing and stridor.   Cardiovascular: Negative for chest pain.  Gastrointestinal: Negative for abdominal pain, nausea and vomiting.  Genitourinary: Negative.   Musculoskeletal: Positive for arthralgias and joint swelling. Negative for myalgias, neck pain and neck stiffness.  Skin: Negative for color change, rash and wound.  Neurological: Negative.  Negative  for weakness and numbness.  All other systems reviewed and are negative.    Physical Exam Updated Vital Signs BP (!) 119/59 (BP Location: Right Arm)   Pulse 79   Temp 98.9 F (37.2 C) (Oral)   Resp 20   Ht 5\' 7"  (1.702 m)   Wt 90.7 kg   SpO2 99%   BMI 31.32 kg/m   Physical Exam Constitutional:      Appearance: He is well-developed.  HENT:     Head: Normocephalic and atraumatic.     Right Ear: Tympanic membrane and ear canal normal.     Left Ear: Tympanic membrane and ear canal normal.     Nose: No mucosal edema or rhinorrhea.     Mouth/Throat:     Pharynx: Oropharynx is clear. Uvula midline. No oropharyngeal exudate or posterior oropharyngeal erythema.     Tonsils: No tonsillar abscesses.  Eyes:     Conjunctiva/sclera: Conjunctivae normal.  Neck:     Musculoskeletal: Normal range of motion.  Cardiovascular:     Rate and Rhythm: Normal rate and regular rhythm.     Heart sounds: Normal heart sounds.     Comments: Pulses equal bilaterally Pulmonary:     Effort: No respiratory distress.     Breath sounds: Examination of the right-middle field reveals decreased breath sounds and rhonchi. Examination of the right-lower field reveals decreased breath sounds and rhonchi. Decreased breath sounds and rhonchi present. No wheezing or rales.  Abdominal:      Palpations: Abdomen is soft.     Tenderness: There is no abdominal tenderness.  Musculoskeletal: Normal range of motion.        General: Tenderness present.     Left hand: He exhibits bony tenderness. Normal sensation noted. Normal strength noted.     Right lower leg: No edema.     Left lower leg: No edema.     Comments: Left wrist flexion and extension and handgrip reduced secondary to pain.  He has the worst pain with attempts at resisted extension of the left thumb.  There is no palpable deformity, no edema or erythema.  There is no crepitus.  His skin is intact without increased redness or warmth.  No edema.  Cap refill less than 2 seconds in all fingertips.  Radial pulse is intact.  Elbow and shoulder without pain. Positive Finkelstein left.  Lymphadenopathy:     Cervical: No cervical adenopathy.  Skin:    General: Skin is warm and dry.     Findings: No erythema, lesion or rash.  Neurological:     Mental Status: He is alert and oriented to person, place, and time.     Sensory: Sensation is intact.      ED Treatments / Results  Labs (all labs ordered are listed, but only abnormal results are displayed) Labs Reviewed - No data to display  EKG None  Radiology Dg Chest 2 View  Result Date: 04/22/2018 CLINICAL DATA:  Cough. EXAM: CHEST - 2 VIEW COMPARISON:  01/15/2017 FINDINGS: Heart size appears normal. No pleural effusion or edema identified. Chronic interstitial scarring identified bilaterally. Chronic interstitial scarring noted bilaterally. Left base opacity is noted which may represent atelectasis or pneumonia. IMPRESSION: 1. Left base atelectasis versus pneumonia. Electronically Signed   By: Kerby Moors M.D.   On: 04/22/2018 09:26   Dg Hand Complete Left  Result Date: 04/22/2018 CLINICAL DATA:  Pain and swelling without trauma EXAM: LEFT HAND - COMPLETE 3+ VIEW COMPARISON:  None. FINDINGS: There is no  evidence of fracture or dislocation. There is no evidence of  arthropathy or other focal bone abnormality. No radiodense foreign body. Soft tissues are unremarkable. IMPRESSION: Negative. Electronically Signed   By: Lucrezia Europe M.D.   On: 04/22/2018 09:37    Procedures Procedures (including critical care time)  Medications Ordered in ED Medications  benzonatate (TESSALON) capsule 200 mg (200 mg Oral Given 04/22/18 1008)  cefTRIAXone (ROCEPHIN) injection 1 g (1 g Intramuscular Given 04/22/18 1016)  lidocaine (XYLOCAINE) 2 % injection (5 mLs  Given 04/22/18 1016)     Initial Impression / Assessment and Plan / ED Course  I have reviewed the triage vital signs and the nursing notes.  Pertinent labs & imaging results that were available during my care of the patient were reviewed by me and considered in my medical decision making (see chart for details).     Imaging reviewed, hand xrays negative, exam suggesting thumb tenosynovitis.  He was placed in a thumb spica splint, discussed heat tx,  Ortho f/u - referral to Dr. Marlou Sa (on call for hand and general ortho).  CXR suggesting atelectasis vs pneumonia left lower - exam findings however more sig right mid to lower lung fields.  Review of xray also suggesting atelectasis, streaking right base. No cp, denies sob. I suspect patient may have an early pneumonia given productive cough and subjective fever.  Denies sob, no wob and no wheezing during visit.  He was placed on abx, tessalon prescribed for cough. Advised close f/u with pcp for a recheck in one week, return here for any worsened sob, weakness or new sx.    Pt was seen by Dr Rogene Houston prior to dc.   Final Clinical Impressions(s) / ED Diagnoses   Final diagnoses:  Community acquired pneumonia, unspecified laterality  Hand pain, left    ED Discharge Orders         Ordered    benzonatate (TESSALON) 100 MG capsule  3 times daily PRN     04/22/18 1028    azithromycin (ZITHROMAX Z-PAK) 250 MG tablet     04/22/18 1028           Evalee Jefferson,  PA-C 04/22/18 1031    Fredia Sorrow, MD 04/23/18 [phone]

## 2018-04-22 NOTE — ED Provider Notes (Signed)
Medical screening examination/treatment/procedure(s) were conducted as a shared visit with non-physician practitioner(s) and myself.  I personally evaluated the patient during the encounter.  None  Patient seen by me along with physician assistant.  Patient presenting with 2 complaints on 1 left hand pain and cough.  Chest x-ray raises concerns for developing a left lower lobe pneumonia but also on our review there is suggestion of streaking in the right middle lobe area as well.  Patient will receive Rocephin here and be treated with Zithromax for this.  Patient nontoxic no acute distress.  Lungs are clear bilaterally.  Left hand may be some tendinitis.  Will be treated with a splint and follow-up.   Fredia Sorrow, MD 04/22/18 1023

## 2018-04-22 NOTE — ED Triage Notes (Signed)
Pt reports productive cough since last Thursday. Denies SOB. Also reports pain and swelling in left hand since last Thursday as well. No known injury

## 2018-04-25 ENCOUNTER — Other Ambulatory Visit: Admit: 2018-04-25 | Discharge: 2018-04-25 | Payer: MEDICARE

## 2018-04-25 ENCOUNTER — Ambulatory Visit: Admit: 2018-04-25 | Discharge: 2018-05-24 | Payer: MEDICARE

## 2018-04-25 ENCOUNTER — Encounter: Admit: 2018-04-25 | Discharge: 2018-05-08 | Payer: MEDICARE

## 2018-04-25 ENCOUNTER — Encounter: Admit: 2018-04-25 | Discharge: 2018-04-25 | Payer: MEDICARE | Attending: Medical Oncology | Primary: Medical Oncology

## 2018-04-25 DIAGNOSIS — C7951 Secondary malignant neoplasm of bone: Secondary | ICD-10-CM

## 2018-04-25 DIAGNOSIS — C61 Malignant neoplasm of prostate: Principal | ICD-10-CM

## 2018-04-25 MED ORDER — METHYLPHENIDATE 10 MG TABLET
ORAL_TABLET | Freq: Every day | ORAL | 0 refills | 0 days | Status: CP
Start: 2018-04-25 — End: 2018-08-08

## 2018-05-09 ENCOUNTER — Encounter: Admit: 2018-05-09 | Discharge: 2018-05-10 | Payer: MEDICARE | Attending: Medical Oncology | Primary: Medical Oncology

## 2018-05-09 ENCOUNTER — Ambulatory Visit: Admit: 2018-05-09 | Discharge: 2018-05-10 | Payer: MEDICARE

## 2018-05-09 DIAGNOSIS — C61 Malignant neoplasm of prostate: Principal | ICD-10-CM

## 2018-05-09 DIAGNOSIS — C7951 Secondary malignant neoplasm of bone: Secondary | ICD-10-CM

## 2018-05-14 ENCOUNTER — Other Ambulatory Visit: Payer: Self-pay | Admitting: *Deleted

## 2018-05-14 ENCOUNTER — Encounter: Payer: Self-pay | Admitting: Gastroenterology

## 2018-05-14 ENCOUNTER — Encounter: Payer: Self-pay | Admitting: *Deleted

## 2018-05-14 ENCOUNTER — Ambulatory Visit (INDEPENDENT_AMBULATORY_CARE_PROVIDER_SITE_OTHER): Payer: Medicare Other | Admitting: Gastroenterology

## 2018-05-14 ENCOUNTER — Telehealth: Payer: Self-pay | Admitting: *Deleted

## 2018-05-14 DIAGNOSIS — C189 Malignant neoplasm of colon, unspecified: Secondary | ICD-10-CM | POA: Diagnosis not present

## 2018-05-14 DIAGNOSIS — D649 Anemia, unspecified: Secondary | ICD-10-CM

## 2018-05-14 DIAGNOSIS — Z85038 Personal history of other malignant neoplasm of large intestine: Secondary | ICD-10-CM

## 2018-05-14 MED ORDER — PEG 3350-KCL-NA BICARB-NACL 420 G PO SOLR: 4000.0000 mL | Freq: Once | ORAL | 0 refills | Status: AC

## 2018-05-14 NOTE — Patient Instructions (Signed)
We will request records for further review.   Colonoscopy and possible upper endoscopy as scheduled. See separate instructions.

## 2018-05-14 NOTE — Progress Notes (Signed)
Primary Care Physician:  Abran Richard, MD  Primary Gastroenterologist:  Garfield Cornea, MD   Chief Complaint  Patient presents with  . Anemia    HPI:  Willie Fowler is a 68 y.o. male here at the request of Dr. Abran Richard for further evaluation of anemia, need for colonoscopy and upper endoscopy.  Patient is a very poor historian.  In the medical record it mentions that he has a history of colon cancer remotely.  When asking the patient for more details, he describes only having colonoscopy, denies surgery.  He may have had carcinoma in situ in a polyp that was removed.  Details need to be sorted out.    He has known metastatic prostate cancer, he states he was diagnosed in the 2008 and underwent external radiation treatment to the spine but no surgery.  He is followed at [facility].  Receives Lupron every 3 months.  Bone scan done on January 2020 showed increased radiotracer activity within the left iliac bone concerning for osseous metastasis (stable), photopenic defect within the left proximal humerus new and compatible with prosthesis implant.  CT abdomen pelvis with contrast January 2020 showed stable heterogeneity of the left iliac wing representing known metastatic disease, no new evidence of metastatic disease in the abdomen and pelvis.  Colonic diverticulosis but no other abnormalities within the colon.  Liver with multiple hepatic cyst.  He had left shoulder surgery in September 2019.  His preop labs showed a normal hemoglobin of 14.5. During surgery and estimated blood loss of 250 cc.  Labs from PCP indicated declining hemoglobin shortly after that with Hgb 11.5 on 01/07/2018.   Labs on April 25, 2018 at [facility] showed normal hemoglobin of 13.8, MCV 87.4, platelets are and 34,000, white blood cell count 9200.  Cmet was normal.  Patient denies previous blood transfusions.  Recently was put on iron but is not clear whether he is actually taking it regularly.  Bowel movements are  regular.  No blood in the stool or melena.  Denies abdominal pain.  Appetite is good.  Only complaint is chronic fatigue.  No heartburn, dysphagia, vomiting.  He takes Lodine twice daily.  Aspirin 81 mg daily.  He is on omeprazole 20 mg daily.  Per PCP, patient's last colonoscopy was in 2016 and supposed to have another in 2021. Unclear if ever had EGD.   Current Outpatient Medications  Medication Sig Dispense Refill  . aspirin EC 81 MG tablet Take 81 mg by mouth daily.    . benzonatate (TESSALON) 100 MG capsule Take 2 capsules (200 mg total) by mouth 3 (three) times daily as needed. 30 capsule 0  . etodolac (LODINE) 400 MG tablet Take 400 mg by mouth 2 (two) times daily.    Marland Kitchen ezetimibe (ZETIA) 10 MG tablet Take 10 mg by mouth daily.     Marland Kitchen gabapentin (NEURONTIN) 600 MG tablet Take 1 tablet by mouth 3 (three) times daily.    Marland Kitchen lisinopril (PRINIVIL,ZESTRIL) 40 MG tablet Take 40 mg by mouth daily.    Marland Kitchen omeprazole (PRILOSEC) 20 MG capsule Take 20 mg by mouth daily.    Marland Kitchen oxybutynin (DITROPAN) 5 MG tablet Take 5-15 mg by mouth See admin instructions. Taking 1 tablet (5mg ) in the morning and 3 tablets (15mg ) at bedtime.    . pregabalin (LYRICA) 150 MG capsule Take 150 mg by mouth 3 (three) times daily.     . tamsulosin (FLOMAX) 0.4 MG CAPS capsule Take 0.4 mg by mouth  daily.    . traMADol (ULTRAM) 50 MG tablet Take by mouth every 6 (six) hours as needed.    . traMADol (ULTRAM) 50 MG tablet Take 1 tablet (50 mg total) by mouth daily as needed. 20 tablet 0  . VENTOLIN HFA 108 (90 BASE) MCG/ACT inhaler Inhale 2 puffs into the lungs every 4 (four) hours as needed.    . verapamil (CALAN-SR) 240 MG CR tablet Take 240 mg by mouth daily.     No current facility-administered medications for this visit.     Allergies as of 05/14/2018  . (No Known Allergies)    Past Medical History:  Diagnosis Date  . Cancer [facility])    Prostate cancer  . Chronic ankle pain   . Chronic back pain   . Chronic left  shoulder pain   . GERD (gastroesophageal reflux disease)   . High cholesterol   . History of alcohol abuse    Reportedly went through withdrawals after his 2017 back surgery.  . Hypertension   . Lumbar radiculopathy   . Pain management    [facility]    Past Surgical History:  Procedure Laterality Date  . BACK SURGERY    . EAR CYST EXCISION Left 12/18/2017   Procedure: EXCISION CYST LEFT SHOULDER;  Surgeon: Meredith Pel, MD;  Location: [facility];  Service: Orthopedics;  Laterality: Left;  . REVERSE SHOULDER ARTHROPLASTY Left 12/18/2017   Procedure: LEFT REVERSE SHOULDER ARTHROPLASTY;  Surgeon: Meredith Pel, MD;  Location: [facility];  Service: Orthopedics;  Laterality: Left;  . TONSILLECTOMY      Family History  Problem Relation Age of Onset  . Cancer Mother        breast  . Cancer Sister        breast    Social History   Socioeconomic History  . Marital status: Divorced    Spouse name: Not on file  . Number of children: Not on file  . Years of education: Not on file  . Highest education level: Not on file  Occupational History  . Not on file  Social Needs  . Financial resource strain: Not on file  . Food insecurity:    Worry: Not on file    Inability: Not on file  . Transportation needs:    Medical: Not on file    Non-medical: Not on file  Tobacco Use  . Smoking status: Current Every Day Smoker    Packs/day: 0.50    Years: 40.00    Pack years: 20.00    Types: Cigarettes  . Smokeless tobacco: Never Used  Substance and Sexual Activity  . Alcohol use: Yes    Comment: weekly-  BEER (12 ounces every few days)  . Drug use: No  . Sexual activity: Not on file  Lifestyle  . Physical activity:    Days per week: Not on file    Minutes per session: Not on file  . Stress: Not on file  Relationships  . Social connections:    Talks on phone: Not on file    Gets together: Not on file    Attends religious service: Not on file    Active member of club or organization:  Not on file    Attends meetings of clubs or organizations: Not on file    Relationship status: Not on file  . Intimate partner violence:    Fear of current or ex partner: Not on file    Emotionally abused: Not on file  Physically abused: Not on file    Forced sexual activity: Not on file  Other Topics Concern  . Not on file  Social History Narrative  . Not on file      ROS:  General: Negative for anorexia, weight loss, fever, chills, positive fatigue, weakness. Eyes: Negative for vision changes.  ENT: Negative for hoarseness, difficulty swallowing , nasal congestion. CV: Negative for chest pain, angina, palpitations, dyspnea on exertion, peripheral edema.  Respiratory: Negative for dyspnea at rest, dyspnea on exertion, cough, sputum, wheezing.  GI: See history of present illness. GU:  Negative for dysuria, hematuria, urinary incontinence, urinary frequency, nocturnal urination.  MS: Negative for joint pain, positive low back pain.  Derm: Negative for rash or itching.  Neuro: Negative for weakness, abnormal sensation, seizure, frequent headaches, memory loss, confusion.  Psych: Negative for anxiety, depression, suicidal ideation, hallucinations.  Endo: Negative for unusual weight change.  Heme: Negative for bruising or bleeding. Allergy: Negative for rash or hives.    Physical Examination:  BP (!) 150/69   Pulse 72   Temp 97.9 F (36.6 C) (Oral)   Ht 5\' 7"  (1.702 m)   Wt 199 lb (90.3 kg)   BMI 31.17 kg/m    General: Well-nourished, well-developed in no acute distress.  Difficult historian Head: Normocephalic, atraumatic.   Eyes: Conjunctiva pink, no icterus. Mouth: Oropharyngeal mucosa moist and pink , no lesions erythema or exudate. Neck: Supple without thyromegaly, masses, or lymphadenopathy.  Lungs: Clear to auscultation bilaterally.  Heart: Regular rate and rhythm, no murmurs rubs or gallops.  Abdomen: Bowel sounds are normal, nontender, nondistended, no  hepatosplenomegaly or masses, no abdominal bruits or    hernia , no rebound or guarding.   Rectal: Not performed Extremities: No lower extremity edema. No clubbing or deformities.  Neuro: Alert and oriented x 4 , grossly normal neurologically.  Skin: Warm and dry, no rash or jaundice.   Psych: Alert and cooperative, normal mood and affect.  Labs: Lab Results  Component Value Date   CREATININE 1.14 12/12/2017   BUN 13 12/12/2017   NA 141 12/12/2017   K 4.2 12/12/2017   CL 103 12/12/2017   CO2 27 12/12/2017   Lab Results  Component Value Date   WBC 11.1 (H) 12/12/2017   HGB 14.5 12/12/2017   HCT 45.1 12/12/2017   MCV 91.9 12/12/2017   PLT 260 12/12/2017   Lab Results  Component Value Date   ALT 15 (L) 01/16/2017   AST 18 01/16/2017   ALKPHOS 90 01/16/2017   BILITOT 0.7 01/16/2017   No results found for: IRON, TIBC, FERRITIN   Imaging Studies: Dg Chest 2 View  Result Date: 04/22/2018 CLINICAL DATA:  Cough. EXAM: CHEST - 2 VIEW COMPARISON:  01/15/2017 FINDINGS: Heart size appears normal. No pleural effusion or edema identified. Chronic interstitial scarring identified bilaterally. Chronic interstitial scarring noted bilaterally. Left base opacity is noted which may represent atelectasis or pneumonia. IMPRESSION: 1. Left base atelectasis versus pneumonia. Electronically Signed   By: Kerby Moors M.D.   On: 04/22/2018 09:26   Dg Hand Complete Left  Result Date: 04/22/2018 CLINICAL DATA:  Pain and swelling without trauma EXAM: LEFT HAND - COMPLETE 3+ VIEW COMPARISON:  None. FINDINGS: There is no evidence of fracture or dislocation. There is no evidence of arthropathy or other focal bone abnormality. No radiodense foreign body. Soft tissues are unremarkable. IMPRESSION: Negative. Electronically Signed   By: Lucrezia Europe M.D.   On: 04/22/2018 09:37

## 2018-05-14 NOTE — Telephone Encounter (Signed)
Pre-op scheduled for 07/08/2018 at 10:00am. Patient aware. Letter mailed. I also reiterated this is just for pre-op and do not start drinking his prep this day! He voiced understanding.

## 2018-05-16 ENCOUNTER — Encounter: Payer: Self-pay | Admitting: Gastroenterology

## 2018-05-16 NOTE — Assessment & Plan Note (Signed)
Very pleasant 68 year old gentleman presenting for further evaluation of anemia, questionable history of remote colon cancer.  Patient does have a history of metastatic prostate cancer which has been stable, originally diagnosed in 2008.  He denies any GI symptoms.  No overt GI bleeding.  He is on Lodine twice daily, ASA daily along with PPI.  Denies any upper GI symptoms.  Offer colonoscopy plus or minus EGD for further evaluation of anemia, questionable history of colon cancer.  Plan for deep sedation given polypharmacy and alcohol use.  I have discussed the risks, alternatives, benefits with regards to but not limited to the risk of reaction to medication, bleeding, infection, perforation and the patient is agreeable to proceed. Written consent to be obtained.  We have requested records from prior colonoscopy.

## 2018-05-20 NOTE — Progress Notes (Signed)
CC'D TO PCP °

## 2018-05-26 ENCOUNTER — Encounter: Payer: Self-pay | Admitting: Gastroenterology

## 2018-05-26 NOTE — Progress Notes (Addendum)
Received las from 03/2018: A1C 5.2 02/2018: WBC 8400, H/H 13/38.8, platelet 295,000.TSH 1.49, iron 41L, tibc 398, fe sat 10%L, ferritin 19L   Colonoscopy 01/2015 with Dr. Posey Pronto: indication for procedure "history of colon polyps". Two 4-76mm polyps removed from sigmoid colon, mild left-sided diverticulosis. Path: adenomatous colon polyps.

## 2018-07-08 ENCOUNTER — Encounter (HOSPITAL_COMMUNITY)
Admission: RE | Admit: 2018-07-08 | Discharge: 2018-07-08 | Disposition: A | Discharge status: Home / Self Care | Payer: Medicare Other | Source: Ambulatory Visit | Attending: Internal Medicine | Admitting: Internal Medicine

## 2018-07-08 ENCOUNTER — Other Ambulatory Visit: Payer: Self-pay

## 2018-07-08 ENCOUNTER — Encounter (HOSPITAL_COMMUNITY): Payer: Self-pay

## 2018-07-08 DIAGNOSIS — Z01812 Encounter for preprocedural laboratory examination: Secondary | ICD-10-CM | POA: Insufficient documentation

## 2018-07-08 HISTORY — DX: Pneumonia, unspecified organism: J18.9

## 2018-07-08 HISTORY — DX: Unspecified osteoarthritis, unspecified site: M19.90

## 2018-07-08 LAB — CBC WITH DIFFERENTIAL/PLATELET
Abs Immature Granulocytes: 0.04 10*3/uL (ref 0.00–0.07)
Basophils Absolute: 0.1 10*3/uL (ref 0.0–0.1)
Basophils Relative: 1 %
Eosinophils Absolute: 0.3 10*3/uL (ref 0.0–0.5)
Eosinophils Relative: 4 %
HCT: 42.8 % (ref 39.0–52.0)
Hemoglobin: 14.2 g/dL (ref 13.0–17.0)
Immature Granulocytes: 0 %
Lymphocytes Relative: 21 %
Lymphs Abs: 1.9 10*3/uL (ref 0.7–4.0)
MCH: 29.8 pg (ref 26.0–34.0)
MCHC: 33.2 g/dL (ref 30.0–36.0)
MCV: 89.9 fL (ref 80.0–100.0)
Monocytes Absolute: 0.7 10*3/uL (ref 0.1–1.0)
Monocytes Relative: 8 %
Neutro Abs: 6.1 10*3/uL (ref 1.7–7.7)
Neutrophils Relative %: 66 %
Platelets: 232 10*3/uL (ref 150–400)
RBC: 4.76 MIL/uL (ref 4.22–5.81)
RDW: 14.1 % (ref 11.5–15.5)
WBC: 9.2 10*3/uL (ref 4.0–10.5)
nRBC: 0 % (ref 0.0–0.2)

## 2018-07-08 NOTE — Patient Instructions (Signed)
Willie Fowler  07/08/2018     @PREFPERIOPPHARMACY @   Your procedure is scheduled on 07/15/18.  Report to [facility] at 1025 A.M.  Call this number if you have problems the morning of surgery:  [phone]   Remember:  Do not eat or drink after midnight.  Follow prep instructions provided to you by your doctor.     Take these medicines the morning of surgery with A SIP OF WATER prilosec, lyrica, flomax & verapamil.  Use your inhalers before you come.    Do not wear jewelry, make-up or nail polish.  Do not wear lotions, powders, or perfumes, or deodorant.  Do not shave 48 hours prior to surgery.  Men may shave face and neck.  Do not bring valuables to the hospital.  [facility] is not responsible for any belongings or valuables.  Contacts, dentures or bridgework may not be worn into surgery.  Leave your suitcase in the car.  After surgery it may be brought to your room.  For patients admitted to the hospital, discharge time will be determined by your treatment team.  Patients discharged the day of surgery will not be allowed to drive home.   Name and phone number of your driver:   Special instructions:    Please read over the following fact sheets that you were given. Anesthesia Post-op Instructions and Care and Recovery After Surgery      PATIENT INSTRUCTIONS POST-ANESTHESIA  IMMEDIATELY FOLLOWING SURGERY:  Do not drive or operate machinery for the first twenty four hours after surgery.  Do not make any important decisions for twenty four hours after surgery or while taking narcotic pain medications or sedatives.  If you develop intractable nausea and vomiting or a severe headache please notify your doctor immediately.  FOLLOW-UP:  Please make an appointment with your surgeon as instructed. You do not need to follow up with anesthesia unless specifically instructed to do so.  WOUND CARE INSTRUCTIONS (if applicable):  Keep a dry clean dressing on the anesthesia/puncture  wound site if there is drainage.  Once the wound has quit draining you may leave it open to air.  Generally you should leave the bandage intact for twenty four hours unless there is drainage.  If the epidural site drains for more than 36-48 hours please call the anesthesia department.  QUESTIONS?:  Please feel free to call your physician or the hospital operator if you have any questions, and they will be happy to assist you.      Upper Endoscopy, Adult Upper endoscopy is a procedure to look inside the upper GI (gastrointestinal) tract. The upper GI tract is made up of:  The part of the body that moves food from your mouth to your stomach (esophagus).  The stomach.  The first part of your small intestine (duodenum). This procedure is also called esophagogastroduodenoscopy (EGD) or gastroscopy. In this procedure, your health care provider passes a thin, flexible tube (endoscope) through your mouth and down your esophagus into your stomach. A small camera is attached to the end of the tube. Images from the camera appear on a monitor in the exam room. During this procedure, your health care provider may also remove a small piece of tissue to be sent to a lab and examined under a microscope (biopsy). Your health care provider may do an upper endoscopy to diagnose cancers of the upper GI tract. You may also have this procedure to find the cause of other conditions, such as:  Stomach pain.  Heartburn.  Pain or problems when swallowing.  Nausea and vomiting.  Stomach bleeding.  Stomach ulcers. Tell a health care provider about:  Any allergies you have.  All medicines you are taking, including vitamins, herbs, eye drops, creams, and over-the-counter medicines.  Any problems you or family members have had with anesthetic medicines.  Any blood disorders you have.  Any surgeries you have had.  Any medical conditions you have.  Whether you are pregnant or may be pregnant. What are the  risks? Generally, this is a safe procedure. However, problems may occur, including:  Infection.  Bleeding.  Allergic reactions to medicines.  A tear or hole (perforation) in the esophagus, stomach, or duodenum. What happens before the procedure? Staying hydrated Follow instructions from your health care provider about hydration, which may include:  Up to 2 hours before the procedure - you may continue to drink clear liquids, such as water, clear fruit juice, black coffee, and plain tea.  Eating and drinking restrictions Follow instructions from your health care provider about eating and drinking, which may include:  8 hours before the procedure - stop eating heavy meals or foods, such as meat, fried foods, or fatty foods.  6 hours before the procedure - stop eating light meals or foods, such as toast or cereal.  6 hours before the procedure - stop drinking milk or drinks that contain milk.  2 hours before the procedure - stop drinking clear liquids. Medicines Ask your health care provider about:  Changing or stopping your regular medicines. This is especially important if you are taking diabetes medicines or blood thinners.  Taking medicines such as aspirin and ibuprofen. These medicines can thin your blood. Do not take these medicines unless your health care provider tells you to take them.  Taking over-the-counter medicines, vitamins, herbs, and supplements. General instructions  Plan to have someone take you home from the hospital or clinic.  If you will be going home right after the procedure, plan to have someone with you for 24 hours.  Ask your health care provider what steps will be taken to help prevent infection. What happens during the procedure?   An IV will be inserted into one of your veins.  You may be given one or more of the following: ? A medicine to help you relax (sedative). ? A medicine to numb the throat (local anesthetic).  You will lie on your  left side on an exam table.  Your health care provider will pass the endoscope through your mouth and down your esophagus.  Your health care provider will use the scope to check the inside of your esophagus, stomach, and duodenum. Biopsies may be taken.  The endoscope will be removed. The procedure may vary among health care providers and hospitals. What happens after the procedure?  Your blood pressure, heart rate, breathing rate, and blood oxygen level will be monitored until you leave the hospital or clinic.  Do not drive for 24 hours if you were given a sedative during your procedure.  When your throat is no longer numb, you may be given some fluids to drink.  It is up to you to get the results of your procedure. Ask your health care provider, or the department that is doing the procedure, when your results will be ready. Summary  Upper endoscopy is a procedure to look inside the upper GI tract.  During the procedure, an IV will be inserted into one of your veins. You may  be given a medicine to help you relax.  A medicine will be used to numb your throat.  The endoscope will be passed through your mouth and down your esophagus. This information is not intended to replace advice given to you by your health care provider. Make sure you discuss any questions you have with your health care provider. Document Released: 03/17/2000 Document Revised: 08/20/2017 Document Reviewed: 08/20/2017 Elsevier Interactive Patient Education  2019 [employer]. Colonoscopy, Adult A colonoscopy is an exam to look at the entire large intestine. During the exam, a lubricated, flexible tube that has a camera on the end of it is inserted into the anus and then passed into the rectum, colon, and other parts of the large intestine. You may have a colonoscopy as a part of normal colorectal screening or if you have certain symptoms, such as:  Lack of red blood cells (anemia).  Diarrhea that does not go  away.  Abdominal pain.  Blood in your stool (feces). A colonoscopy can help screen for and diagnose medical problems, including:  Tumors.  Polyps.  Inflammation.  Areas of bleeding. Tell a health care provider about:  Any allergies you have.  All medicines you are taking, including vitamins, herbs, eye drops, creams, and over-the-counter medicines.  Any problems you or family members have had with anesthetic medicines.  Any blood disorders you have.  Any surgeries you have had.  Any medical conditions you have.  Any problems you have had passing stool. What are the risks? Generally, this is a safe procedure. However, problems may occur, including:  Bleeding.  A tear in the intestine.  A reaction to medicines given during the exam.  Infection (rare). What happens before the procedure? Eating and drinking restrictions Follow instructions from your health care provider about eating and drinking, which may include:  A few days before the procedure - follow a low-fiber diet. Avoid nuts, seeds, dried fruit, raw fruits, and vegetables.  1-3 days before the procedure - follow a clear liquid diet. Drink only clear liquids, such as clear broth or bouillon, black coffee or tea, clear juice, clear soft drinks or sports drinks, gelatin dessert, and popsicles. Avoid any liquids that contain red or purple dye.  On the day of the procedure - do not eat or drink anything starting 2 hours before the procedure, or within the time period that your health care provider recommends. Up to 2 hours before the procedure, you may continue to drink clear liquids, such as water or clear fruit juice. Bowel prep If you were prescribed an oral bowel prep to clean out your colon:  Take it as told by your health care provider. Starting the day before your procedure, you will need to drink a large amount of medicated liquid. The liquid will cause you to have multiple loose stools until your stool is  almost clear or light green.  If your skin or anus gets irritated from diarrhea, you may use these to relieve the irritation: ? Medicated wipes, such as adult wet wipes with aloe and vitamin E. ? A skin-soothing product like petroleum jelly.  If you vomit while drinking the bowel prep, take a break for up to 60 minutes and then begin the bowel prep again. If vomiting continues and you cannot take the bowel prep without vomiting, call your health care provider.  To clean out your colon, you may also be given: ? Laxative medicines. ? Instructions about how to use an enema. General instructions  Ask  your health care provider about: ? Changing or stopping your regular medicines or supplements. This is especially important if you are taking iron supplements, diabetes medicines, or blood thinners. ? Taking medicines such as aspirin and ibuprofen. These medicines can thin your blood. Do not take these medicines before the procedure if your health care provider tells you not to.  Plan to have someone take you home from the hospital or clinic. What happens during the procedure?   An IV may be inserted into one of your veins.  You will be given medicine to help you relax (sedative).  To reduce your risk of infection: ? Your health care team will wash or sanitize their hands. ? Your anal area will be washed with soap.  You will be asked to lie on your side with your knees bent.  Your health care provider will lubricate a long, thin, flexible tube. The tube will have a camera and a light on the end.  The tube will be inserted into your anus.  The tube will be gently eased through your rectum and colon.  Air will be delivered into your colon to keep it open. You may feel some pressure or cramping.  The camera will be used to take images during the procedure.  A small tissue sample may be removed to be examined under a microscope (biopsy).  If small polyps are found, your health care  provider may remove them and have them checked for cancer cells.  When the exam is done, the tube will be removed. The procedure may vary among health care providers and hospitals. What happens after the procedure?  Your blood pressure, heart rate, breathing rate, and blood oxygen level will be monitored until the medicines you were given have worn off.  Do not drive for 24 hours after the exam.  You may have a small amount of blood in your stool.  You may pass gas and have mild abdominal cramping or bloating due to the air that was used to inflate your colon during the exam.  It is up to you to get the results of your procedure. Ask your health care provider, or the department performing the procedure, when your results will be ready. Summary  A colonoscopy is an exam to look at the entire large intestine.  During a colonoscopy, a lubricated, flexible tube with a camera on the end of it is inserted into the anus and then passed into the colon and other parts of the large intestine.  Follow instructions from your health care provider about eating and drinking before the procedure.  If you were prescribed an oral bowel prep to clean out your colon, take it as told by your health care provider.  After your procedure, your blood pressure, heart rate, breathing rate, and blood oxygen level will be monitored until the medicines you were given have worn off. This information is not intended to replace advice given to you by your health care provider. Make sure you discuss any questions you have with your health care provider. Document Released: 03/17/2000 Document Revised: 01/10/2017 Document Reviewed: 06/01/2015 Elsevier Interactive Patient Education  2019 [employer].

## 2018-07-10 ENCOUNTER — Encounter (HOSPITAL_COMMUNITY): Payer: Self-pay | Admitting: Anesthesiology

## 2018-07-10 ENCOUNTER — Telehealth: Payer: Self-pay | Admitting: *Deleted

## 2018-07-10 NOTE — Telephone Encounter (Signed)
Carolyn from endo called wanting to know if patient would move procedure time up to 8:45am on 4/13.   Called patient and he was agreeable. He is aware to arrive at 7am. He will drink 2nd half of prep at 3:45am. He can have clear liquids until 5:45am and nothing after this point.

## 2018-07-11 ENCOUNTER — Telehealth: Payer: Self-pay | Admitting: Internal Medicine

## 2018-07-11 ENCOUNTER — Telehealth: Payer: Self-pay | Admitting: *Deleted

## 2018-07-11 NOTE — Telephone Encounter (Signed)
Called patient to r/s tcs +/-egd with propofol w/ rmr. Home # no answer and no VM, called mobile and also unable to leave message.

## 2018-07-11 NOTE — Telephone Encounter (Signed)
Patient called and is r/s'd to 6/22 at 9am. Patient aware will mail new instructions/pre-op appt. He voiced understanding. Endo is aware of appt change.

## 2018-07-11 NOTE — Telephone Encounter (Signed)
Pt was returning a call to [place]. He said to call [phone]

## 2018-07-11 NOTE — Telephone Encounter (Signed)
See previous phone note.  

## 2018-07-28 ENCOUNTER — Encounter: Payer: Self-pay | Admitting: Orthopedic Surgery

## 2018-07-28 ENCOUNTER — Encounter: Payer: Self-pay | Admitting: Orthopaedic Surgery

## 2018-08-08 ENCOUNTER — Encounter: Admit: 2018-08-08 | Discharge: 2018-08-08 | Payer: MEDICARE

## 2018-08-08 ENCOUNTER — Encounter: Admit: 2018-08-08 | Discharge: 2018-08-08 | Payer: MEDICARE | Attending: Medical Oncology | Primary: Medical Oncology

## 2018-08-08 DIAGNOSIS — C61 Malignant neoplasm of prostate: Principal | ICD-10-CM

## 2018-08-08 DIAGNOSIS — C7951 Secondary malignant neoplasm of bone: Secondary | ICD-10-CM

## 2018-08-08 LAB — COMPREHENSIVE METABOLIC PANEL
ALKALINE PHOSPHATASE: 103 U/L (ref 38–126)
ALT (SGPT): 19 U/L (ref <50)
ANION GAP: 9 mmol/L (ref 7–15)
AST (SGOT): 29 U/L (ref 19–55)
BLOOD UREA NITROGEN: 14 mg/dL (ref 7–21)
BUN / CREAT RATIO: 14
CALCIUM: 9.5 mg/dL (ref 8.5–10.2)
CHLORIDE: 110 mmol/L — ABNORMAL HIGH (ref 98–107)
CO2: 23 mmol/L (ref 22.0–30.0)
EGFR CKD-EPI AA MALE: 87 mL/min/{1.73_m2} (ref >=60)
EGFR CKD-EPI NON-AA MALE: 76 mL/min/{1.73_m2} (ref >=60)
GLUCOSE RANDOM: 89 mg/dL (ref 70–179)
POTASSIUM: 4.6 mmol/L (ref 3.5–5.0)
PROTEIN TOTAL: 7.4 g/dL (ref 6.5–8.3)
SODIUM: 142 mmol/L (ref 135–145)

## 2018-08-08 LAB — PROSTATE SPECIFIC ANTIGEN: Prostate specific Ag:MCnc:Pt:Ser/Plas:Qn:: 2.96

## 2018-08-08 LAB — CALCIUM: Calcium:MCnc:Pt:Ser/Plas:Qn:: 9.5

## 2018-08-08 LAB — TESTOSTERONE TOTAL: Testosterone:MCnc:Pt:Ser/Plas:Qn:: 13 — ABNORMAL LOW

## 2018-08-08 MED ORDER — ENZALUTAMIDE 40 MG CAPSULE
ORAL_CAPSULE | Freq: Every day | ORAL | 6 refills | 30.00000 days | Status: CP
Start: 2018-08-08 — End: ?
  Filled 2018-08-13: qty 120, 30d supply, fill #0

## 2018-08-08 MED ORDER — METHYLPHENIDATE 10 MG TABLET
ORAL_TABLET | Freq: Every day | ORAL | 0 refills | 0.00000 days | Status: CP
Start: 2018-08-08 — End: ?

## 2018-08-08 NOTE — Unmapped (Signed)
MEDICAL ONCOLOGY FOLLOW UP VISIT    I spent 20 minutes on the audio/video with the patient. I spent an additional 25 minutes on pre- and post-visit activities.     The patient was physically located in [place] or a state in which I am permitted to provide care. The patient and/or parent/gauardian understood that s/he may incur co-pays and cost sharing, and agreed to the telemedicine visit. The visit was completed via phone and/or video, which was appropriate and reasonable under the circumstances given the patient's presentation at the time.    The patient and/or parent/guardian has been advised of the potential risks and limitations of this mode of treatment (including, but not limited to, the absence of in-person examination) and has agreed to be treated using telemedicine. The patient's/patient's family's questions regarding telemedicine have been answered.     If the phone/video visit was completed in an ambulatory setting, the patient and/or parent/guardian has also been advised to contact their provider???s office for worsening conditions, and seek emergency medical treatment and/or call 911 if the patient deems either necessary.      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  Imaging in 04/2018 when PSA was rising and at about a value of 2 showed stable disease in iliac similar to 2014.  I have discussed options with him such as adding another agent like enzalutamide, and he wishes to do that, so I will initiate process for adding enzalutamide.  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      Plan:   - ADT: He received 3-month Lupron 22.5mg  on 08/08/18.  I tried to change to 43-month injection to reduce exposure to health system due to coronavirus but insurance refused this apparently.  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    - Ritalin 10mg  for fatigue.  - Back pain/sciatica: L2-S1 decompression in 10/17 w Katheran Awe.  Off narcotic analgesics.  He is followed by Maryruth Eve in the Pain Clinic who is now managing his analgesics.  The patient feels his chronic back pain persists despite spine RT, and given this plus MRI findings, his pain is likely not related to prostate cancer. He previously saw Dr. Sabino Gasser of PM&R, and tried an epidural injection but the patient feels this did not help.  Has seen Charlett Blake for pain in this clinic too.    - Ankle pain: Persists since December so >3 months, xray was negative, and say orthopaedics but focus was on pain management. Will refer to PM&R for this.   - Nocturia improved with Ditropan: has met with w [facility] in past.  - Hot flashes - on Gabapentin 300 TID, Katina Degree PhD has counseled, pharmacy seeing today again. Stable  - Recommended Ca/Vit D.  - For ED, has seen Dr. Colon Branch in the past.  Viagra does not help.  He has spoken with Urology about options.    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  - BMD 6/17: The femoral neck density is mildly low, but the spine and total femoral densities are normal.    Return for follow up evaluation and Lupron, and assessment of starting enzalutamide.     -------------------------------  Other Physicians:   Dr. Maryruth Eve [facility] Medicine)  Dr. Carnella [facility] PM&R)  Dr. Nolon Rod (referring; Radiation Oncology)   Dr. Baxter Flattery (Urology)   Dr. Nuala Alpha (Anesthesia pain)   Dr. Hoy Morn (Urology, RE: ED)   Dr. 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     Current therapy: Lupron    Oncology history:   - Diagnosed 2008 with prostate cancer, PSA 13.5.   - Prostate biopsy on 04/02/07 w Gleason 3+4=7   - 2009 Radiation therapy to w Dr. Nonie Hoyer 78 Gy.   - 2012-13: Rising PSA   - 11/13: Saw [place] rad onc, felt not candidate for salvage RT   - 2/14: BS c/w 2009 with suspicious increased uptake at T5, L inferior iliac bone, subtle at L1.   - CT A/P shows sclerosis at L4-5 equivocal for DJD vs metastases, I discussed w radiology, felt most likely metastatic.   - 3/14: Lupron started   - 12/14: Lupron held for intermittent approach  - 11/13/13: Restarted Lupron due to PSA rise with worsening back pain  - 8/15: Palliative RT to spine in [place], [place].  - 05/25/14:  MRI spine: Multilevel degenerative changes, most prominent at L3-4 and L4-L5, where there is marked spinal canal stenosis and crowding with potential indentation of the nerve roots. Severe bilateral neural foraminal narrowing at L4-5 and moderate to marked neuroforaminal narrowing at L3-4 on the right. Additional multilevel degenerative changes as outlined above.    - 08/15/15: Abnormal focal marrow signal and enhancement at the endplates surrounding the L4-5 disc space are favored to represent acutely inflamed Schmorl's nodes. Metastatic disease is felt less likely but is not entirely excluded, particularly given enlargement of the lesion at the L5 level. Continued follow-up recommended.  - 6/17: Bone mineral density: The femoral neck density is mildly low, but the spine and total femoral densities are normal.  - 10/17: L2-S1 decompression surgery w Dr. 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  - 12/17: L ankle pain, negative xray.  - 04/20/18: CT A/P: Stable heterogeneity of the left iliac wing representing known metastatic disease. No new evidence of metastatic disease within the abdomen/pelvis.  - 04/25/18: Bone scan: Increased radiotracer activity within the left iliac bone concerning for osseous metastases.  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  Ongoing back pain w L sciatica.  Hot flashes persist on Lupron, daily.  Nocturia/chronic dysuria: much better with Ditropan 0-1x/night.  No pain.  Persistent ED.  Remainder of 10 system ROS otherwise negative.    Physical exam:   There were no vitals taken for this visit.  NAD  A+Ox3  No visible rash  No dyspnea or cough  No apparent neurological deficit    Current Outpatient Medications   Medication Sig Dispense Refill   ??? aspirin (ECOTRIN) 81 MG tablet Take 81 mg by mouth daily.     ??? azithromycin (ZITHROMAX) 250 MG tablet Take 500 mg by mouth daily. For 4 days     ??? benzonatate (TESSALON) 100 MG capsule Take 200 mg by mouth Three (3) times a day as needed.     ??? ezetimibe (ZETIA) 10 mg tablet Take 10 mg by mouth daily. Frequency:QD   Dosage:10  MG  Instructions:  Note:Dose: 10MG      ??? gabapentin (NEURONTIN) 600 MG tablet Take 600 mg by mouth Two (2) times a day.     ??? lisinopril (PRINIVIL,ZESTRIL) 40 MG tablet Take 40 mg by mouth daily.      ??? methylphenidate HCl (RITALIN) 10 MG tablet Take 1 tablet (10 mg total) by mouth daily. 30 tablet 0   ??? omeprazole (PRILOSEC) 20 MG capsule Take 20 mg by mouth daily.     ??? oxybutynin (DITROPAN) 5 MG tablet Take 1 tablet (5 mg total) by mouth Three (3) times a day.  0   ??? oxyCODONE (ROXICODONE) 5 MG immediate release tablet Take 1 tablet (5 mg total) by mouth every four (4) hours as needed for pain. for up to 10 doses 5 tablet 0   ??? pregabalin (LYRICA) 150 MG capsule Take 1 capsule (150 mg total) by mouth three (3) times a day (at 6am, noon and 6pm). 90 capsule 1   ??? tamsulosin (FLOMAX) 0.4 mg capsule Take 1 capsule (0.4 mg total) by mouth daily. 30 capsule 11   ??? traMADol (ULTRAM) 50 mg tablet Take 50 mg by mouth every six (6) hours as needed for pain.     ??? triamcinolone (KENALOG) 0.1 % cream daily.     ??? VENTOLIN HFA 90 mcg/actuation inhaler Inhale 1 puff every four (4) hours as needed.      ??? verapamil (CALAN-SR) 240 MG CR tablet Take 240 mg by mouth. Frequency:QD   Dosage:240   MG  Instructions:  Note:Dose: 240 MG       No current facility-administered medications for this visit.      Facility-Administered Medications Ordered in Other Visits   Medication Dose Route Frequency Provider Last Rate Last Dose   ??? leuprolide (6 month) (ELIGARD) injection 45 mg  45 mg Subcutaneous Once Chrisandra Netters, MD           No Known Allergies    Laboratory:   06/27/12: PSA=3 (on Casodex), testosterone >800     Lab Results   Component Value Date    PSA Diagnostic 0.6 05/14/2014    PSA Diagnostic 0.5 02/05/2014    PSA Diagnostic 6.9 (H) 11/06/2013    PSA Diagnostic 2.4 07/24/2013    PSA Diagnostic 0.3 03/20/2013    PSA Diagnostic 0.2 12/26/2012       Lab on 08/08/2018   Component Date Value Ref Range Status   ??? PSA 08/08/2018 2.96  0.00 - 4.00 ng/mL Final   ??? Sodium 08/08/2018 142  135 - 145 mmol/L Final   ??? Potassium 08/08/2018 4.6  3.5 - 5.0 mmol/L Final   ??? Chloride 08/08/2018 110* 98 - 107 mmol/L Final   ??? Anion Gap 08/08/2018 9  7 - 15 mmol/L Final   ??? CO2 08/08/2018 23.0  22.0 - 30.0 mmol/L Final   ??? BUN 08/08/2018 14  7 - 21 mg/dL Final   ??? Creatinine 08/08/2018 1.01  0.70 - 1.30 mg/dL Final   ??? BUN/Creatinine Ratio 08/08/2018 14   Final   ??? EGFR CKD-EPI Non-African American,* 08/08/2018 76  >=60 mL/min/1.61m2 Final   ??? EGFR CKD-EPI African American, Male 08/08/2018 87  >=60 mL/min/1.23m2 Final   ??? Glucose 08/08/2018 89  70 - 179 mg/dL Final   ??? Calcium 14/78/2956 9.5  8.5 - 10.2 mg/dL Final   ??? Albumin 21/30/8657 4.1  3.5 - 5.0 g/dL Final   ??? Total Protein 08/08/2018 7.4  6.5 - 8.3 g/dL  Final   ??? Total Bilirubin 08/08/2018 0.2  0.0 - 1.2 mg/dL Final   ??? AST 30/86/5784 29  19 - 55 U/L Final   ??? ALT 08/08/2018 19  <50 U/L Final   ??? Alkaline Phosphatase 08/08/2018 103  38 - 126 U/L Final   ??? Testosterone 08/08/2018 13* 179 - 756 ng/dL Final     Lab Results   Component Value Date    PSA 2.96 08/08/2018    PSA 1.79 04/25/2018    PSA 2.01 01/10/2018    PSA 1.20 10/11/2017    PSA 0.91 07/05/2017    PSA 1.19 03/08/2017     3/17: Thyroid function WNL.

## 2018-08-08 NOTE — Unmapped (Signed)
Hi,     Patient contacted the Communication Center requesting to speak with the care team of [facility] to discuss:    Requesting to speak with Dr. Vernell Barrier regarding if he needs to continue taking the pills he was talking about.    Please contact patient at [phone].    Check Indicates criteria has been reviewed and confirmed with the patient:    [x]  Preferred Name   [x]  DOB and/or MR#  [x]  Preferred Contact Method  [x]  Phone Number(s)   []  MyChart     Thank you,   Laverna Peace  [facility]   [phone]

## 2018-08-08 NOTE — Unmapped (Signed)
Nice seeing you today.   If you have any questions please call the Nurse Navigator in the office at [phone], or [phone]) [phone].  Frederic Jericho, MD

## 2018-08-08 NOTE — Unmapped (Signed)
Labs drawn via venipuncture, sent for analysis. Care provided by Judie Petit redick, RN

## 2018-08-09 NOTE — Unmapped (Signed)
Spoke with pt - he wanted to know if he can continue taking ritalin. I told him that he can. Reinforced that ritalin was for his fatigue and if helping to continue taking it.

## 2018-08-12 NOTE — Unmapped (Signed)
[facility]   Patient Onboarding/Medication Counseling    Mr.Calvin Obrien is a 68 y.o. male with prostate cancer who I am counseling today on initiation of therapy.  I am speaking to the patient.    Verified patient's date of birth / HIPAA.    Specialty medication(s) to be sent: Hematology/Oncology: [name]      Non-specialty medications/supplies to be sent: none      Medications not needed at this time: none         Xtandi (Enzalutamide)    Medication & Administration     Dosage: Take 4 capsules (160mg ) once daily    Administration:   ? Take at same time each day  ? Take with or without food  ? Take with calcium and vitamin D  ? Swallow capsules whole; do not chew, dissolve, or open the capsules.    Adherence/Missed dose instructions:  ? Take a missed dose as soon as you think about it.  ? If you do not think about the missed dose until the next day, skip the missed dose and go back to your normal time.  ? Do not take 2 doses at the same time or extra doses    Goals of Therapy     ? Prevent disease progression    Side Effects & Monitoring Parameters     ? Common side effects  ? Feeling dizzy, tired, or weak.  ? Back, muscle, or joint pain.  ? Muscle weakness.  ? Not hungry.  ? Diarrhea or constipation.  ? Hot flashes.  ? Signs of a common cold.  ? Weight loss.  ? Headache.  ? Trouble sleeping.  ? Anxiety.  ? Change in taste    ? The following side effects should be reported to the provider:  ? Allergic reaction  ? Signs of high blood pressure (very bad headache or dizziness, passing out, or change in eyesight)  ? Signs of electrolyte problems (mood changes, confusion, muscle pain or weakness, abnormal heartbeat, seizures, not hungry, or very bad upset stomach or throwing up)  ? Chest pain or pressure or shortness of breath  ? Any abnormal burning, numbness, or tingling feeling  ? Blood in the urine.  ? Seizures  ? Swelling in the arms or legs  ? Bone pain after a fall  ? Memory problems or loss or not able to focus  ? Signs of infection   ? Signs of a brain problem called posterior reversible encephalopathy syndrome (PRES) (feeling confused, lowered alertness, change in eyesight, loss of eyesight, seizures, or very bad headache)    ? Monitoring Parameters:   ? Monitor blood pressure    Contraindications, Warnings, & Precautions     ? Male patients with male partners of reproductive potential should use effective contraception during treatment and for 3 months after the last dose  ? Cardiovascular events, specifically ischemic heart disease  ? Fractures - evaluate fall and fracture risk  ? Posterior reversible encephalopathy syndrome - monitor for headache, seizure, confusion, and visual changes  ? Seizures     Drug/Food Interactions     ? Medication list reviewed in Epic. The patient was instructed to inform the care team before taking any new medications or supplements.  Enzalutamide may decrease the serum concentrations of omeprazole, tramadol, oxycodone, tamsulosin, and verapamil.     Storage, Handling Precautions, & Disposal     ? Store at room temperature in a dry place  ? Keep in a safe  place away from children and pets  ? This drug is considered hazardous and should be handled as little as possible.  If someone else helps with medication administration, they should wear gloves.      Current Medications (including OTC/herbals), Comorbidities and Allergies     Current Outpatient Medications   Medication Sig Dispense Refill   ??? aspirin (ECOTRIN) 81 MG tablet Take 81 mg by mouth daily.     ??? azithromycin (ZITHROMAX) 250 MG tablet Take 500 mg by mouth daily. For 4 days     ??? benzonatate (TESSALON) 100 MG capsule Take 200 mg by mouth Three (3) times a day as needed.     ??? enzalutamide (XTANDI) 40 mg capsule Take 4 capsules (160 mg) by mouth daily. 120 capsule 6   ??? ezetimibe (ZETIA) 10 mg tablet Take 10 mg by mouth daily. Frequency:QD   Dosage:10   MG  Instructions:  Note:Dose: 10MG      ??? gabapentin (NEURONTIN) 600 MG tablet Take 600 mg by mouth Two (2) times a day.     ??? lisinopril (PRINIVIL,ZESTRIL) 40 MG tablet Take 40 mg by mouth daily.      ??? methylphenidate HCl (RITALIN) 10 MG tablet Take 1 tablet (10 mg total) by mouth daily. 30 tablet 0   ??? omeprazole (PRILOSEC) 20 MG capsule Take 20 mg by mouth daily.     ??? oxybutynin (DITROPAN) 5 MG tablet Take 1 tablet (5 mg total) by mouth Three (3) times a day.  0   ??? oxyCODONE (ROXICODONE) 5 MG immediate release tablet Take 1 tablet (5 mg total) by mouth every four (4) hours as needed for pain. for up to 10 doses 5 tablet 0   ??? pregabalin (LYRICA) 150 MG capsule Take 1 capsule (150 mg total) by mouth three (3) times a day (at 6am, noon and 6pm). 90 capsule 1   ??? tamsulosin (FLOMAX) 0.4 mg capsule Take 1 capsule (0.4 mg total) by mouth daily. 30 capsule 11   ??? traMADol (ULTRAM) 50 mg tablet Take 50 mg by mouth every six (6) hours as needed for pain.     ??? triamcinolone (KENALOG) 0.1 % cream daily.     ??? VENTOLIN HFA 90 mcg/actuation inhaler Inhale 1 puff every four (4) hours as needed.      ??? verapamil (CALAN-SR) 240 MG CR tablet Take 240 mg by mouth. Frequency:QD   Dosage:240   MG  Instructions:  Note:Dose: 240 MG       No current facility-administered medications for this visit.        No Known Allergies    Patient Active Problem List   Diagnosis   ??? 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 Lethargy   ??? Chronic bilateral low back pain   ??? Essential hypertension   ??? Allergic rhinitis   ??? Uncomplicated asthma   ??? Lumbar radiculopathy   ??? Acute left ankle pain   ??? Hot flashes   ??? 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    Appropriateness of Therapy     Is medication and dose appropriate based on diagnosis? Yes    Baseline Quality of Life Assessment      How many days over the past month did your prostate cancer keep you from your normal activities? 0 Financial Information     Medication Assistance provided: Prior Authorization  Anticipated copay of $3.90 reviewed with patient. Verified delivery address.    Delivery Information     Scheduled delivery date: 08/14/18    Expected start date: 08/15/18    Medication will be delivered via UPS to the prescription address in [facility].  This shipment will not require a signature.      Explained the services we provide at [facility] and that each month we would call to set up refills.  Stressed importance of returning phone calls so that we could ensure they receive their medications in time each month.  Informed patient that we should be setting up refills 7-10 days prior to when they will run out of medication.  A pharmacist will reach out to perform a clinical assessment periodically.  Informed patient that a welcome packet and a drug information handout will be sent.      Patient verbalized understanding of the above information as well as how to contact the pharmacy at [phone] option 4 with any questions/concerns.  The pharmacy is open Monday through Friday 8:30am-4:30pm.  A pharmacist is available 24/7 via pager to answer any clinical questions they may have.    Patient Specific Needs     ? Does the patient have any physical, cognitive, or cultural barriers? No    ? Patient prefers to have medications discussed with  Patient     ? Is the patient able to read and understand education materials at a high school level or above? Yes    ? Patient's primary language is  English     ? Is the patient high risk? No     ? Does the patient require a Care Management Plan? No     ? Does the patient require physician intervention or other additional services (i.e. nutrition, smoking cessation, social work)? No      Doretta Remmert [facility] Specialty Pharmacist

## 2018-08-12 NOTE — Unmapped (Signed)
[facility] Specialty Medication Referral: PA Approved      Medication (Brand/Generic): XTANDI    Final Test Claim completed with resulted information below:    Patient ABLE to fill at [facility]  Insurance Company:  [employer]  Anticipated Copay: $3.90  Is anticipated copay with a copay card or grant? No, there is no need for grant or copay assistance.     Does patient's insurance plan only allow a 15 day supply for the first 6 fills in the [facility]? NO  If yes, inform patient they can request to dis-enroll from the [facility] by calling the patient help desk at NOT APPLICABLE.      If the copay is under the $25 defined limit, per policy there will be no further investigation of need for financial assistance at this time unless patient requests. This referral has been communicated to the provider and handed off to the [facility] team for further processing and filling of prescribed medication.   ______________________________________________________________________  Please utilize this referral for viewing purposes as it will serve as the central location for all relevant documentation and updates.

## 2018-08-13 MED FILL — XTANDI 40 MG CAPSULE: 30 days supply | Qty: 120 | Fill #0 | Status: AC

## 2018-08-14 NOTE — Unmapped (Signed)
Left message for pt to return call in reference to appt scheduled with pharmacist Hurley Cisco on 5/21.    Calvin Obrien

## 2018-08-22 ENCOUNTER — Encounter
Admit: 2018-08-22 | Discharge: 2018-08-23 | Payer: MEDICARE | Attending: Pharmacist Clinician (PhC)/ Clinical Pharmacy Specialist | Primary: Pharmacist Clinician (PhC)/ Clinical Pharmacy Specialist

## 2018-08-22 DIAGNOSIS — I10 Essential (primary) hypertension: Principal | ICD-10-CM

## 2018-08-22 MED ORDER — HYDROCHLOROTHIAZIDE 25 MG TABLET
ORAL_TABLET | Freq: Every day | ORAL | 11 refills | 0 days | Status: CP
Start: 2018-08-22 — End: ?

## 2018-08-22 NOTE — Unmapped (Signed)
Clinical Pharmacist Practitioner: [facility] Telephone Note    Patient Name: Calvin Obrien  Patient Age: 68 y.o.    The patient was physically located in [place] or a state in which I am permitted to provide care. The patient and/or parent/gauardian understood that s/he may incur co-pays and cost sharing, and agreed to the telemedicine visit. The visit was completed via phone and/or video, which was appropriate and reasonable under the circumstances given the patient's presentation at the time.    The patient and/or parent/guardian has been advised of the potential risks and limitations of this mode of treatment (including, but not limited to, the absence of in-person examination) and has agreed to be treated using telemedicine. The patient's/patient's family's questions regarding telemedicine have been answered.     If the phone/video visit was completed in an ambulatory setting, the patient and/or parent/guardian has also been advised to contact their provider???s office for worsening conditions, and seek emergency medical treatment and/or call 911 if the patient deems either necessary.      Assessment and recommendations:  1) Metastatic castrate resistant prostate cancer: tolerating enzalutamide with minimal side effects.   - Continue enzalutamide 160mg  PO daily    2) HTN: taking lisinopril and verapamil. BP has been running high (150/70) likely due to drug interaction between verapamil and enzalutamide which can decrease the concentrations of verapamil by more than 75% making it ineffective.   - Continue lisinopril 40 mg daily  - START hydrochlorothiazide 25mg  PO daily  - STOP verapamil CR 240 mg daily as it is no longer effective in controlling blood pressure with enzalutamide induction.    3) Xerostomia: started after taking enzalutamide and is decreasing his appetite and desire to eat.   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 Imaging in 04/2018 when PSA was rising and at about a value of 2 showed stable disease in iliac similar to 2014. He was started on enzalutamide therapy in 08/2018.     Interval History:  Calvin Obrien is doing well today and only reports mild side effects after starting enzalutamide. For the first 2 days after starting he reported having constipation, which has now resolved. He also reports having some dry mouth which is continued. This has impacted his appetite and desire to eat such that he now only eats 1 meal a day rather than 2 meals, but he does not notice any change in his weight. He also does not notice a change in his fatigue, and denies any myalgia/arthralgias or memory issues.     Calvin Obrien blood pressures have been elevated since starting enzalutamide. Last week they were in the 160s/70s and this week they are in the 150s/70s. He reports being complaint with his lisinopril and verapamil and checks his BP daily. He denies any headache, chest pain, blurry vision, or change in urine output.    Adherence: No missed doses, taking appropriately  Medications:  Current Outpatient Medications   Medication Sig Dispense Refill   ??? aspirin (ECOTRIN) 81 MG tablet Take 81 mg by mouth daily.     ??? azithromycin (ZITHROMAX) 250 MG tablet Take 500 mg by mouth daily. For 4 days     ??? benzonatate (TESSALON) 100 MG capsule Take 200 mg by mouth Three (3) times a day as needed.     ??? enzalutamide (XTANDI) 40 mg capsule Take 4 capsules (160 mg) by mouth daily. 120 capsule 6   ??? ezetimibe (ZETIA) 10 mg tablet Take 10 mg by mouth daily. Frequency:QD   Dosage:10   MG  Instructions:  Note:Dose: 10MG      ??? gabapentin (NEURONTIN) 600 MG tablet Take 600 mg by mouth Two (2) times a day.     ??? lisinopril (PRINIVIL,ZESTRIL) 40 MG tablet Take 40 mg by mouth daily.      ??? methylphenidate HCl (RITALIN) 10 MG tablet Take 1 tablet (10 mg total) by mouth daily. 30 tablet 0   ??? omeprazole (PRILOSEC) 20 MG capsule Take 20 mg by mouth daily.     ??? oxybutynin (DITROPAN) 5 MG tablet Take 1 tablet (5 mg total) by mouth Three (3) times a day.  0   ??? oxyCODONE (ROXICODONE) 5 MG immediate release tablet Take 1 tablet (5 mg total) by mouth every four (4) hours as needed for pain. for up to 10 doses 5 tablet 0   ??? pregabalin (LYRICA) 150 MG capsule Take 1 capsule (150 mg total) by mouth three (3) times a day (at 6am, noon and 6pm). 90 capsule 1   ??? tamsulosin (FLOMAX) 0.4 mg capsule Take 1 capsule (0.4 mg total) by mouth daily. 30 capsule 11   ??? traMADol (ULTRAM) 50 mg tablet Take 50 mg by mouth every six (6) hours as needed for pain.     ??? triamcinolone (KENALOG) 0.1 % cream daily.     ??? VENTOLIN HFA 90 mcg/actuation inhaler Inhale 1 puff every four (4) hours as needed.      ??? verapamil (CALAN-SR) 240 MG CR tablet Take 240 mg by mouth. Frequency:QD   Dosage:240   MG  Instructions:  Note:Dose: 240 MG       No current facility-administered medications for this visit.      I spent 20 minutes in direct patient care.    Nicholos Johns, PharmD   PGY-2 Oncology Pharmacy Resident  Pager: [phone]    I was the precepting pharmacist in the delivery of services. I agree with the plan as documented.    Laverna Peace PharmD, BCOP, CPP  Hematology/Oncology Pharmacist  P: [phone]

## 2018-08-23 NOTE — Unmapped (Signed)
Hi,     Patient contacted the Communication Center requesting to speak with the care team of Calvin Obrien to discuss:    Medication questions.    Please contact patient at [phone].    Check Indicates criteria has been reviewed and confirmed with the patient:    [x]  Preferred Name   [x]  DOB and/or MR#  [x]  Preferred Contact Method  [x]  Phone Number(s)   []  MyChart     Thank you,   Drema Balzarine  [facility]   [phone]

## 2018-08-23 NOTE — Unmapped (Signed)
Called both number - unable to leave VM, mailbox full.

## 2018-08-27 NOTE — Unmapped (Signed)
Hi,     Pt contacted the Communication Center regarding the following:    - his fingernails are turning black on his first and middle finger. Wants to know if that is his medication.     Please contact at [phone].    Thanks in advance,    Deatra Ina  [facility]   [phone]

## 2018-08-27 NOTE — Unmapped (Signed)
Returned call to pt stating his 1st and middle fingernails on both hands are turning dark almost black near the cuticle. He was checking to see if this was a possible side effect from his meds. Pt takes Xtandi.Told him I would consult the team for their input. He was agreeable to this plan.

## 2018-09-04 NOTE — Unmapped (Signed)
Unable to [facility] in regards to appointment reminder or medication reconciliation. Sharyn Blitz, CNA

## 2018-09-05 ENCOUNTER — Encounter
Admit: 2018-09-05 | Payer: MEDICARE | Attending: Pharmacist Clinician (PhC)/ Clinical Pharmacy Specialist | Primary: Pharmacist Clinician (PhC)/ Clinical Pharmacy Specialist

## 2018-09-06 NOTE — Unmapped (Signed)
Confirmed Patient's appointment and completed medication reconciliation. Sharyn Blitz, CNA

## 2018-09-06 NOTE — Unmapped (Signed)
Spoke with [name], he is aware of phone consultation with pharmacist Hurley Cisco on 6/8.    Jaynie Bream

## 2018-09-09 ENCOUNTER — Encounter
Admit: 2018-09-09 | Discharge: 2018-09-10 | Payer: MEDICARE | Attending: Pharmacist Clinician (PhC)/ Clinical Pharmacy Specialist | Primary: Pharmacist Clinician (PhC)/ Clinical Pharmacy Specialist

## 2018-09-09 MED ORDER — MIRTAZAPINE 7.5 MG TABLET
ORAL_TABLET | Freq: Every evening | ORAL | 1 refills | 0.00000 days | Status: CP
Start: 2018-09-09 — End: ?

## 2018-09-09 NOTE — Unmapped (Signed)
Called pt, unable to leave message; voicemail full.    Calvin Obrien

## 2018-09-09 NOTE — Unmapped (Signed)
Clinical Pharmacist Practitioner: [facility] Telephone Note    Patient Name: Calvin Obrien  Patient Age: 68 y.o.    The patient was physically located in [place] or a state in which I am permitted to provide care. The patient and/or parent/guardian understood that s/he may incur co-pays and cost sharing, and agreed to the telemedicine visit. The visit was completed via phone and/or video, which was appropriate and reasonable under the circumstances given the patient's presentation at the time.    The patient and/or parent/guardian has been advised of the potential risks and limitations of this mode of treatment (including, but not limited to, the absence of in-person examination) and has agreed to be treated using telemedicine. The patient's/patient's family's questions regarding telemedicine have been answered.     If the phone/video visit was completed in an ambulatory setting, the patient and/or parent/guardian has also been advised to contact their provider???s office for worsening conditions, and seek emergency medical treatment and/or call 911 if the patient deems either necessary.    Assessment and recommendations:  1) Metastatic castrate resistant prostate cancer: tolerating enzalutamide with minimal side effects.   - Continue enzalutamide 160mg  PO daily    2) HTN: switched verapamil to HCTZ due to CCB induction by enzalutamide with good effect, BP is now in the 140/70s (down from 150-160 SBP).  - Continue lisinopril 40 mg daily  - Continue hydrochlorothiazide 25mg  PO daily    3) Poor appetite: started after taking enzalutamide and is decreasing his appetite and desire to eat.  - START mirtazapine 7.5mg  PO qHS    4) Fatigue: present prior to starting enzalutamide, was initiated on methylphenidate in October, 2019 with good effect. Fatigue likely due to poor PO intake over the last 4 weeks.  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 Imaging in 04/2018 when PSA was rising and at about a value of 2 showed stable disease in iliac similar to 2014. He was started on enzalutamide therapy in 08/2018.     Interval History:  Calvin Obrien is doing well today and only reports mild side effects after starting enzalutamide. His initial constipation with enzalutamide is resolved, and was having some stomach pains but is also resolving now. He continues to endorse poor appetite which he noticed after starting enzalutamide. He was previously eating two meals a day and now only eats one meal a day. He does not feel like he has lost weight, but also does not check regularly. He does note that he is more fatigued recently, and spends more time sitting in his chair, but is still able to walk around the house.     Calvin Obrien blood pressures have been doing better after stopping the verapamil and starting the HCTZ. Was previously on verapamil, but due to drug-drug interaction BP was rising to 150-160s/70s. Now after switching therapies, his BP is running in the 140/70s. He denies any signs of dizziness, lightheadedness,  or syncope.     Adherence: No missed doses, taking appropriately at 0700 every morning    Medications:  Current Outpatient Medications   Medication Sig Dispense Refill   ??? aspirin (ECOTRIN) 81 MG tablet Take 81 mg by mouth daily.     ??? azithromycin (ZITHROMAX) 250 MG tablet Take 500 mg by mouth daily. For 4 days     ??? benzonatate (TESSALON) 100 MG capsule Take 200 mg by mouth Three (3) times a day as needed.     ??? enzalutamide (XTANDI) 40 mg capsule Take 4 capsules (160 mg) by mouth daily. 120 capsule 6   ??? ezetimibe (ZETIA) 10 mg tablet Take 10 mg by mouth daily. Frequency:QD   Dosage:10   MG  Instructions:  Note:Dose: 10MG      ??? gabapentin (NEURONTIN) 600 MG tablet Take 600 mg by mouth Two (2) times a day.     ??? hydroCHLOROthiazide (HYDRODIURIL) 25 MG tablet Take 1 tablet (25 mg total) by mouth daily. 30 tablet 11   ??? lisinopril (PRINIVIL,ZESTRIL) 40 MG tablet Take 40 mg by mouth daily.      ??? methylphenidate HCl (RITALIN) 10 MG tablet Take 1 tablet (10 mg total) by mouth daily. 30 tablet 0   ??? omeprazole (PRILOSEC) 20 MG capsule Take 20 mg by mouth daily.     ??? oxybutynin (DITROPAN) 5 MG tablet Take 1 tablet (5 mg total) by mouth Three (3) times a day.  0   ??? oxyCODONE (ROXICODONE) 5 MG immediate release tablet Take 1 tablet (5 mg total) by mouth every four (4) hours as needed for pain. for up to 10 doses 5 tablet 0   ??? pregabalin (LYRICA) 150 MG capsule Take 1 capsule (150 mg total) by mouth three (3) times a day (at 6am, noon and 6pm). 90 capsule 1   ??? tamsulosin (FLOMAX) 0.4 mg capsule Take 1 capsule (0.4 mg total) by mouth daily. 30 capsule 11   ??? traMADol (ULTRAM) 50 mg tablet Take 50 mg by mouth every six (6) hours as needed for pain.     ??? triamcinolone (KENALOG) 0.1 % cream daily.     ??? VENTOLIN HFA 90 mcg/actuation inhaler Inhale 1 puff every four (4) hours as needed.        No current facility-administered medications for this visit.      I spent 30 minutes in direct patient care.    Nicholos Johns, PharmD   PGY-2 Oncology Pharmacy Resident  Pager: [phone]    I was the precepting pharmacist in the delivery of services. I agree with the plan as documented.    Laverna Peace PharmD, BCOP, CPP  Hematology/Oncology Pharmacist  P: [phone]

## 2018-09-10 MED FILL — XTANDI 40 MG CAPSULE: ORAL | 30 days supply | Qty: 120 | Fill #1

## 2018-09-10 MED FILL — XTANDI 40 MG CAPSULE: 30 days supply | Qty: 120 | Fill #1 | Status: AC

## 2018-09-10 NOTE — Unmapped (Signed)
[facility] Clinical Assessment & Refill Coordination Note    Calvin Obrien, DOB: [birth year]  Phone: [phone] (home)     All above HIPAA information was verified with patient.     Specialty Medication(s):   Hematology/Oncology: Calvin Obrien     Current Outpatient Medications   Medication Sig Dispense Refill   ??? aspirin (ECOTRIN) 81 MG tablet Take 81 mg by mouth daily.     ??? azithromycin (ZITHROMAX) 250 MG tablet Take 500 mg by mouth daily. For 4 days     ??? benzonatate (TESSALON) 100 MG capsule Take 200 mg by mouth Three (3) times a day as needed.     ??? enzalutamide (XTANDI) 40 mg capsule Take 4 capsules (160 mg) by mouth daily. 120 capsule 6   ??? ezetimibe (ZETIA) 10 mg tablet Take 10 mg by mouth daily. Frequency:QD   Dosage:10   MG  Instructions:  Note:Dose: 10MG      ??? gabapentin (NEURONTIN) 600 MG tablet Take 600 mg by mouth Two (2) times a day.     ??? hydroCHLOROthiazide (HYDRODIURIL) 25 MG tablet Take 1 tablet (25 mg total) by mouth daily. 30 tablet 11   ??? lisinopril (PRINIVIL,ZESTRIL) 40 MG tablet Take 40 mg by mouth daily.      ??? methylphenidate HCl (RITALIN) 10 MG tablet Take 1 tablet (10 mg total) by mouth daily. 30 tablet 0   ??? mirtazapine (REMERON) 7.5 MG tablet Take 1 tablet (7.5 mg total) by mouth nightly. 30 tablet 1   ??? omeprazole (PRILOSEC) 20 MG capsule Take 20 mg by mouth daily.     ??? oxybutynin (DITROPAN) 5 MG tablet Take 1 tablet (5 mg total) by mouth Three (3) times a day.  0   ??? oxyCODONE (ROXICODONE) 5 MG immediate release tablet Take 1 tablet (5 mg total) by mouth every four (4) hours as needed for pain. for up to 10 doses 5 tablet 0   ??? pregabalin (LYRICA) 150 MG capsule Take 1 capsule (150 mg total) by mouth three (3) times a day (at 6am, noon and 6pm). 90 capsule 1   ??? tamsulosin (FLOMAX) 0.4 mg capsule Take 1 capsule (0.4 mg total) by mouth daily. 30 capsule 11   ??? traMADol (ULTRAM) 50 mg tablet Take 50 mg by mouth every six (6) hours as needed for pain.     ??? triamcinolone (KENALOG) 0.1 % cream daily.     ??? VENTOLIN HFA 90 mcg/actuation inhaler Inhale 1 puff every four (4) hours as needed.        No current facility-administered medications for this visit.         Changes to medications: Calvin Obrien reports no changes at this time.    No Known Allergies    Changes to allergies: No    SPECIALTY MEDICATION ADHERENCE     Xtandi 40 mg: 2 days of medicine on hand     Specialty medication(s) dose(s) confirmed: Regimen is correct and unchanged.     Are there any concerns with adherence? No    Adherence counseling provided? Not needed    CLINICAL MANAGEMENT AND INTERVENTION      Clinical Benefit Assessment:    Do you feel the medicine is effective or helping your condition? Yes    Clinical Benefit counseling provided? Progress note from 09/09/18 shows evidence of clinical benefit    Adverse Effects Assessment:    Are you experiencing any side effects? No    Are you experiencing difficulty administering  your medicine? No    Quality of Life Assessment:    How many days over the past month did your prostate cancer  keep you from your normal activities? For example, brushing your teeth or getting up in the morning. feeling more tired and sitting more than usual but can do daily activities    Have you discussed this with your provider? Yes    Therapy Appropriateness:    Is therapy appropriate? Yes, therapy is appropriate and should be continued    DISEASE/MEDICATION-SPECIFIC INFORMATION      N/A    PATIENT SPECIFIC NEEDS     ? Does the patient have any physical, cognitive, or cultural barriers? No    ? Is the patient high risk? No     ? Does the patient require a Care Management Plan? No     ? Does the patient require physician intervention or other additional services (i.e. nutrition, smoking cessation, social work)? No      SHIPPING     Specialty Medication(s) to be Shipped:   Hematology/Oncology: Calvin Obrien    Other medication(s) to be shipped: none     Changes to insurance: No    Delivery Scheduled: Yes, Expected medication delivery date: 09/11/18.     Medication will be delivered via UPS to the confirmed home address in [facility].    The patient will receive a drug information handout for each medication shipped and additional FDA Medication Guides as required.  Verified that patient has previously received a Conservation officer, historic buildings.    All of the patient's questions and concerns have been addressed.    [name] [facility] Specialty Pharmacist

## 2018-09-13 NOTE — Unmapped (Signed)
Clinical Pharmacist Practitioner: Telephone Note    I received a call today from Calvin Obrien stating he has not received his shipment of enzalutamide yet. I see the medication was filled and shipped by [facility] on 6/9. I have provided their phone number to the patient and asked that he call them to inquire about the status of this delivery.    I spent 3 minutes in direct patient care.    Nicholos Johns, PharmD   PGY-2 Oncology Pharmacy Resident  Pager: [phone]

## 2018-09-17 ENCOUNTER — Encounter (HOSPITAL_COMMUNITY)
Admission: RE | Admit: 2018-09-17 | Discharge: 2018-09-17 | Disposition: A | Discharge status: Home / Self Care | Payer: Medicare Other | Source: Ambulatory Visit | Attending: Internal Medicine | Admitting: Internal Medicine

## 2018-09-17 ENCOUNTER — Other Ambulatory Visit: Payer: Self-pay

## 2018-09-18 ENCOUNTER — Encounter (HOSPITAL_COMMUNITY): Payer: Self-pay

## 2018-09-18 ENCOUNTER — Other Ambulatory Visit: Payer: Self-pay

## 2018-09-19 ENCOUNTER — Other Ambulatory Visit (HOSPITAL_COMMUNITY)
Admission: RE | Admit: 2018-09-19 | Discharge: 2018-09-19 | Disposition: A | Discharge status: Home / Self Care | Payer: Medicare Other | Source: Ambulatory Visit | Attending: Internal Medicine | Admitting: Internal Medicine

## 2018-09-19 DIAGNOSIS — Z1159 Encounter for screening for other viral diseases: Secondary | ICD-10-CM | POA: Diagnosis present

## 2018-09-20 LAB — NOVEL CORONAVIRUS, NAA (HOSP ORDER, SEND-OUT TO REF LAB; TAT 18-24 HRS): SARS-CoV-2, NAA: NOT DETECTED

## 2018-09-23 ENCOUNTER — Encounter (HOSPITAL_COMMUNITY): Admission: RE | Payer: Self-pay | Source: Ambulatory Visit

## 2018-09-23 ENCOUNTER — Other Ambulatory Visit: Payer: Self-pay

## 2018-09-23 ENCOUNTER — Telehealth: Payer: Self-pay | Admitting: Internal Medicine

## 2018-09-23 ENCOUNTER — Ambulatory Visit (HOSPITAL_COMMUNITY): Admission: RE | Admit: 2018-09-23 | Payer: Medicare Other | Source: Ambulatory Visit | Admitting: Internal Medicine

## 2018-09-23 DIAGNOSIS — Z85038 Personal history of other malignant neoplasm of large intestine: Secondary | ICD-10-CM

## 2018-09-23 DIAGNOSIS — D649 Anemia, unspecified: Secondary | ICD-10-CM

## 2018-09-23 SURGERY — COLONOSCOPY WITH PROPOFOL
Anesthesia: Monitor Anesthesia Care

## 2018-09-23 MED ORDER — PEG 3350-KCL-NA BICARB-NACL 420 G PO SOLR: 4000.0000 mL | ORAL | 0 refills | Status: DC

## 2018-09-23 NOTE — Telephone Encounter (Signed)
Pt said his procedure TCS/EGD with RMR was cancelled this morning and was calling to reschedule. [phone]

## 2018-09-23 NOTE — Telephone Encounter (Signed)
Pre-op appt 12/16/18 at 11:00am. Appt letter mailed with procedure instructions.

## 2018-09-23 NOTE — Telephone Encounter (Signed)
Called pt, his procedure was cancelled because he didn't have a driver with him this morning. TCS/-/+EGD w/Propofol w/RMR rescheduled to 12/19/18 at 9:15am. Orders entered. Rx for prep sent to pharmacy. Reminded pt he will need to have driver with him [id number] understanding.

## 2018-10-03 NOTE — Unmapped (Signed)
[facility] Refill Coordination Note    Specialty Medication(s) to be Shipped:   Hematology/Oncology: [name]    Other medication(s) to be shipped: none     Calvin Obrien, DOB: 1951/02/16  Phone: [phone] (home)       All above HIPAA information was verified with patient.     Completed refill call assessment today to schedule patient's medication shipment from the [facility] [phone]).       Specialty medication(s) and dose(s) confirmed: Regimen is correct and unchanged.   Changes to medications: [name] reports no changes at this time.  Changes to insurance: No  Questions for the pharmacist: No    Confirmed patient received Welcome Packet with first shipment. The patient will receive a drug information handout for each medication shipped and additional FDA Medication Guides as required.       DISEASE/MEDICATION-SPECIFIC INFORMATION        N/A    SPECIALTY MEDICATION ADHERENCE     Medication Adherence    Patient reported X missed doses in the last month:  0  Specialty Medication:  Xtandi   Patient is on additional specialty medications:  No                Xtandi 40 mg: 7 days of medicine on hand          SHIPPING     Shipping address confirmed in Epic.     Delivery Scheduled: Yes, Expected medication delivery date: 10/09/18.     Medication will be delivered via UPS to the prescription address in Epic [facility].    [name]   [facility] Specialty Technician

## 2018-10-08 ENCOUNTER — Other Ambulatory Visit: Payer: Self-pay

## 2018-10-08 ENCOUNTER — Encounter (HOSPITAL_COMMUNITY): Payer: Self-pay | Admitting: *Deleted

## 2018-10-08 ENCOUNTER — Emergency Department (HOSPITAL_COMMUNITY)
Admission: EM | Admit: 2018-10-08 | Discharge: 2018-10-08 | Disposition: A | Discharge status: Home / Self Care | Payer: Medicare Other | Attending: Emergency Medicine | Admitting: Emergency Medicine

## 2018-10-08 DIAGNOSIS — L237 Allergic contact dermatitis due to plants, except food: Secondary | ICD-10-CM | POA: Insufficient documentation

## 2018-10-08 DIAGNOSIS — Z79899 Other long term (current) drug therapy: Secondary | ICD-10-CM | POA: Diagnosis not present

## 2018-10-08 DIAGNOSIS — I10 Essential (primary) hypertension: Secondary | ICD-10-CM | POA: Insufficient documentation

## 2018-10-08 MED ORDER — PREDNISONE 50 MG PO TABS
60.0000 mg | ORAL_TABLET | Freq: Once | ORAL | Status: AC
  Administered 2018-10-08: 60 mg via ORAL
  Filled 2018-10-08: qty 1

## 2018-10-08 MED ORDER — PREDNISONE 20 MG PO TABS: ORAL_TABLET | ORAL | 0 refills | Status: DC

## 2018-10-08 MED FILL — XTANDI 40 MG CAPSULE: ORAL | 30 days supply | Qty: 120 | Fill #2

## 2018-10-08 MED FILL — XTANDI 40 MG CAPSULE: 30 days supply | Qty: 120 | Fill #2 | Status: AC

## 2018-10-08 NOTE — ED Triage Notes (Signed)
Pt c/o having rash and blisters from poison ivy around bilateral eyes

## 2018-10-08 NOTE — ED Notes (Signed)
Pt says that he has been working in the yard today and believes that he came in contact with poison ivy.

## 2018-10-08 NOTE — ED Provider Notes (Signed)
[facility] EMERGENCY DEPARTMENT Provider Note   CSN: [id number] Arrival date & time: 10/08/18  2107    History   Chief Complaint Chief Complaint  Patient presents with  . Poison Ivy    HPI Willie Fowler is a 68 y.o. male.   The history is provided by the patient.  He has a history of hypertension, hyperlipidemia and comes in with an itchy rash on the left side of the face since last night. He thinks he was around poison ivy while working in his yard. There is no other rash.  Past Medical History:  Diagnosis Date  . Arthritis   . Cancer [facility])    Prostate cancer  . Chronic ankle pain   . Chronic back pain   . Chronic left shoulder pain   . GERD (gastroesophageal reflux disease)   . High cholesterol   . History of alcohol abuse    Reportedly went through withdrawals after his 2017 back surgery.  . Hypertension   . Lumbar radiculopathy   . Pain management    [facility]  . Pneumonia     Patient Active Problem List   Diagnosis Date Noted  . Anemia 05/14/2018  . Colon cancer ([place]) 05/14/2018    Class: History of  . Rotator cuff arthropathy, left   . Shoulder arthritis 12/18/2017  . Pneumonia 01/14/2017  . LLL pneumonia ([place]) 01/14/2017  . Acute respiratory distress 01/14/2017  . Acute respiratory failure with hypoxia ([place]) 01/14/2017  . Chronic back pain 01/14/2017  . Opioid dependence ([place]) 01/14/2017  . High cholesterol 01/14/2017  . Current every day smoker 01/14/2017  . Leukocytosis 01/14/2017  . Constipation due to opioid therapy 01/14/2017  . Hypertension 01/14/2017  . Hyponatremia 01/14/2017  . Dehydration 01/14/2017    Past Surgical History:  Procedure Laterality Date  . BACK SURGERY    . COLONOSCOPY  2016   Dr. Posey Pronto: two 4-6 mm sigmoid colon polyps removed (adenomatous), mild left sided diverticulosis  . EAR CYST EXCISION Left 12/18/2017   Procedure: EXCISION CYST LEFT SHOULDER;  Surgeon: Meredith Pel, MD;  Location: [place];  Service:  Orthopedics;  Laterality: Left;  . REVERSE SHOULDER ARTHROPLASTY Left 12/18/2017   Procedure: LEFT REVERSE SHOULDER ARTHROPLASTY;  Surgeon: Meredith Pel, MD;  Location: [place];  Service: Orthopedics;  Laterality: Left;  . TONSILLECTOMY          Home Medications    Prior to Admission medications   Medication Sig Start Date End Date Taking? Authorizing Provider  ezetimibe (ZETIA) 10 MG tablet Take 10 mg by mouth daily.  08/18/11   [provider]  gabapentin (NEURONTIN) 600 MG tablet Take 600 mg by mouth 3 (three) times daily.  12/19/16   [provider]  lisinopril (PRINIVIL,ZESTRIL) 40 MG tablet Take 40 mg by mouth daily. 12/25/14   [provider]  omeprazole (PRILOSEC) 20 MG capsule Take 20 mg by mouth daily.    [provider]  oxybutynin (DITROPAN) 5 MG tablet Take 5-15 mg by mouth See admin instructions. Taking 1 tablet (5mg ) in the morning and 3 tablets (15mg ) at bedtime.    [provider]  polyethylene glycol-electrolytes (TRILYTE) 420 g solution Take 4,000 mLs by mouth as directed. 09/23/18   Daneil Dolin, MD  predniSONE (DELTASONE) 20 MG tablet 2 tabs po daily 0/1/09   Delora Fuel, MD  pregabalin (LYRICA) 150 MG capsule Take 150 mg by mouth 2 (two) times daily.     [provider]  tamsulosin (FLOMAX) 0.4 MG CAPS capsule Take 0.4 mg by mouth daily. 04/19/12   [provider]  traMADol (ULTRAM) 50 MG tablet Take 50 mg by mouth 4 (four) times daily.     [provider]  VENTOLIN HFA 108 (90 BASE) MCG/ACT inhaler Inhale 2 puffs into the lungs every 4 (four) hours as needed for wheezing.  03/16/15   [provider]  verapamil (CALAN-SR) 240 MG CR tablet Take 240 mg by mouth daily. 11/19/08   [provider]    Family History Family History  Problem Relation Age of Onset  . Cancer Mother        breast  . Cancer Sister        breast  . Colon cancer Neg Hx     Social History Social  History   Tobacco Use  . Smoking status: Current Every Day Smoker    Packs/day: 0.50    Years: 40.00    Pack years: 20.00    Types: Cigarettes  . Smokeless tobacco: Never Used  Substance Use Topics  . Alcohol use: Yes    Comment: weekly-  BEER (12 ounces every few days)  . Drug use: No     Allergies   Patient has no known allergies.   Review of Systems Review of Systems  All other systems reviewed and are negative.    Physical Exam Updated Vital Signs BP (!) 142/62 (BP Location: Right Arm)   Pulse 80   Temp 98.3 F (36.8 C) (Oral)   Resp 18   Ht 5\' 7"  (1.702 m)   Wt 89.8 kg   SpO2 97%   BMI 31.01 kg/m   Physical Exam Vitals signs and nursing note reviewed.    68 year old male, resting comfortably and in no acute distress. Vital signs are significant for borderline elevated blood pressure. Oxygen saturation is 97%, which is normal. Head is normocephalic and atraumatic. PERRLA, EOMI. Oropharynx is clear. Neck is nontender and supple without adenopathy or JVD. Back is nontender and there is no CVA tenderness. Lungs are clear without rales, wheezes, or rhonchi. Chest is nontender. Heart has regular rate and rhythm without murmur. Abdomen is soft, flat, nontender without masses or hepatosplenomegaly and peristalsis is normoactive. Extremities have no cyanosis or edema, full range of motion is present. Skin: \Eerythematous rash on left side of the face with small vesicles present. Neurologic: Mental status is normal, cranial nerves are intact, there are no motor or sensory deficits.  ED Treatments / Results   Procedures Procedures   Medications Ordered in ED Medications  predniSONE (DELTASONE) tablet 60 mg (has no administration in time range)     Initial Impression / Assessment and Plan / ED Course  I have reviewed the triage vital signs and the nursing notes.  Poison ivy dermatitis. He is discharged with a prescription for prednisone.  Final Clinical  Impressions(s) / ED Diagnoses   Final diagnoses:  Poison ivy dermatitis    ED Discharge Orders         Ordered    predniSONE (DELTASONE) 20 MG tablet     10/08/18 7209           Delora Fuel, MD 47/09/62 2339

## 2018-10-08 NOTE — Discharge Instructions (Addendum)
Take diphenhydramine (Benadryl) as needed for itching.

## 2018-10-15 NOTE — Unmapped (Signed)
Murvin Donning, CNA

## 2018-10-16 ENCOUNTER — Encounter
Admit: 2018-10-16 | Discharge: 2018-10-17 | Payer: MEDICARE | Attending: Pharmacist Clinician (PhC)/ Clinical Pharmacy Specialist | Primary: Pharmacist Clinician (PhC)/ Clinical Pharmacy Specialist

## 2018-10-16 NOTE — Unmapped (Signed)
Left message for pt's sister to call back or have Mr. Cosman return call. Unable to leave message on pt's number as well as friend's number.    Jaynie Bream

## 2018-10-16 NOTE — Unmapped (Signed)
Clinical Pharmacist Practitioner: [facility] Telephone Note    Patient Name: Calvin Obrien  Patient Age: 68 y.o.    I spent 24 minutes on the phone with the patient. I spent an additional 20 minutes on pre- and post-visit activities.     The patient was physically located in [place] or a state in which I am permitted to provide care. The patient and/or parent/guardian understood that s/he may incur co-pays and cost sharing, and agreed to the telemedicine visit. The visit was reasonable and appropriate under the circumstances given the patient's presentation at the time.    The patient and/or parent/guardian has been advised of the potential risks and limitations of this mode of treatment (including, but not limited to, the absence of in-person examination) and has agreed to be treated using telemedicine. The patient's/patient's family's questions regarding telemedicine have been answered.     If the visit was completed in an ambulatory setting, the patient and/or parent/guardian has also been advised to contact their provider???s office for worsening conditions, and seek emergency medical treatment and/or call 911 if the patient deems either necessary.    Assessment and recommendations:  1) Metastatic castrate resistant prostate cancer: tolerating enzalutamide with minimal side effects. Last PSA 2.96 when initiated enzalutamide. Repeat labs due on 8/6  - Continue enzalutamide 160mg  PO daily  - Next lupron due on 11/07/18 (last 08/08/18)    2) HTN: switched verapamil to HCTZ due to CCB induction by enzalutamide with good effect, BP is now in the 130s/70s (down from 150-160 SBP).  - Continue lisinopril 40 mg daily  - Continue hydrochlorothiazide 25mg  PO daily    3) Poor appetite: started after taking enzalutamide and possibly due to methylphenidate as well had a decreased appetite and desire to eat. Started mirtazapine 7.5 mg qHS in June with good effect he is eating better and feeling well.   - Continue mirtazapine 7.5mg  PO qHS    4) Fatigue: present prior to starting enzalutamide, was initiated on methylphenidate in October, 2019. Continues to do well with methylphenidate with improved energy.   - Continue methylphenidate 10mg  PO daily    5) Chronic back pain- evidence of degenerative changes in L spine, has had L2-S1 decompression on 02/01/16 no evidence of malignancy in the spine. Was followed by [facility] pain clinic s/p surgery but no longer sees them. Requested oxycodone and tramadol prescriptions.  - Denied opioid prescriptions for patient, referred him back to his primary care provider for chronic back pain.     Follow- up: 11/07/18 with Dr. Vernell Barrier and due for labs and lupron    ______________________________________________________________________    Reason for visit:  Enzalutamide monitoring and side effect management    Current Drug and Dose: Enzalutamide 160mg  PO daily    Date of Initiation: 08/15/2018    Oral Agent Toxicities:  Hypertension (Grade 1)  Poor appetite (Grade 2)  Fatigue (Grade 1)    HPI:  Calvin Obrien is a 68 y/o M with metastatic prostate cancer to spine and iliac bone, s/p spine RT, on Lupron (since 2014), likely now castration resistant based on rising albeit relatively low PSA. Imaging in 04/2018 when PSA was rising and at about a value of 2 showed stable disease in iliac similar to 2014. He was started on enzalutamide therapy in 08/2018.     Interval History:  Calvin Obrien is doing well today and only reports mild side effects after starting enzalutamide. His poor appetite has improved with the initiation of mirtazapine.  He continues to be fatigued but his energy is ok and he is able to get out for walks. Mr. Mccarey blood pressures have been doing better after stopping the verapamil and starting the HCTZ. Was previously on verapamil, but due to drug-drug interaction BP was rising to 150-160s/70s. Now after switching therapies, his BP is running in the 130s/70s.    Lastly he complains of back pain, he does have evidence of degenerative changes of L3-5 on bone scan but no evidence of cancer. He used to follow with chronic pain [facility] at [facility] but no longer does. He currently takes gabapentin, lyrica, and tramadol with little effect.     Adherence: No missed doses, taking appropriately at 0700 every morning    Medications:  Current Outpatient Medications   Medication Sig Dispense Refill   ??? aspirin (ECOTRIN) 81 MG tablet Take 81 mg by mouth daily.     ??? azithromycin (ZITHROMAX) 250 MG tablet Take 500 mg by mouth daily. For 4 days     ??? benzonatate (TESSALON) 100 MG capsule Take 200 mg by mouth Three (3) times a day as needed.     ??? enzalutamide (XTANDI) 40 mg capsule Take 4 capsules (160 mg) by mouth daily. 120 capsule 6   ??? ezetimibe (ZETIA) 10 mg tablet Take 10 mg by mouth daily. Frequency:QD   Dosage:10   MG  Instructions:  Note:Dose: 10MG      ??? gabapentin (NEURONTIN) 600 MG tablet Take 600 mg by mouth Two (2) times a day.     ??? hydroCHLOROthiazide (HYDRODIURIL) 25 MG tablet Take 1 tablet (25 mg total) by mouth daily. 30 tablet 11   ??? lisinopril (PRINIVIL,ZESTRIL) 40 MG tablet Take 40 mg by mouth daily.      ??? methylphenidate HCl (RITALIN) 10 MG tablet Take 1 tablet (10 mg total) by mouth daily. 30 tablet 0   ??? mirtazapine (REMERON) 7.5 MG tablet Take 1 tablet (7.5 mg total) by mouth nightly. 30 tablet 1   ??? omeprazole (PRILOSEC) 20 MG capsule Take 20 mg by mouth daily.     ??? oxybutynin (DITROPAN) 5 MG tablet Take 1 tablet (5 mg total) by mouth Three (3) times a day.  0   ??? oxyCODONE (ROXICODONE) 5 MG immediate release tablet Take 1 tablet (5 mg total) by mouth every four (4) hours as needed for pain. for up to 10 doses 5 tablet 0   ??? pregabalin (LYRICA) 150 MG capsule Take 1 capsule (150 mg total) by mouth three (3) times a day (at 6am, noon and 6pm). 90 capsule 1   ??? tamsulosin (FLOMAX) 0.4 mg capsule Take 1 capsule (0.4 mg total) by mouth daily. 30 capsule 11   ??? traMADol (ULTRAM) 50 mg tablet Take 50 mg by mouth every six (6) hours as needed for pain.     ??? triamcinolone (KENALOG) 0.1 % cream daily.     ??? VENTOLIN HFA 90 mcg/actuation inhaler Inhale 1 puff every four (4) hours as needed.        No current facility-administered medications for this visit.      Laverna Peace PharmD, BCOP, CPP  Hematology/Oncology Pharmacist  P: [phone]

## 2018-10-21 NOTE — Unmapped (Signed)
Tried to call [name] to inform him appts with Dr. Vernell Barrier on 8/6 needed to be rescheduled, pt unavailable. Also called pt's friend Talbert Forest and was unable to leave message. Pt's appt will be rescheduled and notification will be mailed.    Jaynie Bream

## 2018-10-22 ENCOUNTER — Telehealth: Payer: Self-pay

## 2018-10-22 NOTE — Telephone Encounter (Signed)
RMR will not be available 12/19/18. Called pt, TCS/-/+EGD w/Propofol rescheduled to 12/05/18 at 9:15am.. Endo scheduler informed. Pre-op appt 12/03/18 at 9:00am. Letter mailed with new procedure instructions.

## 2018-11-11 NOTE — Unmapped (Signed)
[facility] Refill Coordination Note    Specialty Medication(s) to be Shipped:   Hematology/Oncology: Calvin Obrien    Other medication(s) to be shipped: n/a     Calvin Obrien, DOB: [birth year]  Phone: [phone] (home)       All above HIPAA information was verified with patient.     Completed refill call assessment today to schedule patient's medication shipment from the [facility] [phone]).       Specialty medication(s) and dose(s) confirmed: Regimen is correct and unchanged.   Changes to medications: [name] reports no changes reported at this time.  Changes to insurance: No  Questions for the pharmacist: No    Confirmed patient received Welcome Packet with first shipment. The patient will receive a drug information handout for each medication shipped and additional FDA Medication Guides as required.       DISEASE/MEDICATION-SPECIFIC INFORMATION        N/A    SPECIALTY MEDICATION ADHERENCE                Xtandi 40 mg: 3 days of medicine on hand         SHIPPING     Shipping address confirmed in Epic.     Delivery Scheduled: Yes, Expected medication delivery date: 11/13/18.     Medication will be delivered via UPS to the home address in Epic [place].    Calvin Obrien Calvin Obrien   [facility] Specialty Pharmacist

## 2018-11-13 MED FILL — XTANDI 40 MG CAPSULE: ORAL | 30 days supply | Qty: 120 | Fill #3

## 2018-11-13 MED FILL — XTANDI 40 MG CAPSULE: 30 days supply | Qty: 120 | Fill #3 | Status: AC

## 2018-11-13 NOTE — Unmapped (Signed)
Calvin Obrien 's Xtandi shipment will be delayed due to Insufficient inventory We have contacted the patient and communicated the delivery change to patient/caregiver We will reschedule the medication for the delivery date that the patient agreed upon. We have confirmed the delivery date as 11/14/2018 .

## 2018-11-26 NOTE — Unmapped (Signed)
Hi,     [place] contacted the [employer] requesting to speak with the care team of Calvin Obrien to discuss:    At [facility].  They need for for gas voucher faxed to them.    He is sitting in the office waiting there.    Fax:  [phone]    Please contact at [phone].    Program: Heme Malignancy  Speciality: Medical Oncology    Check Indicates criteria has been reviewed and confirmed with the patient:    []  Preferred Name   [x]  DOB and/or MR#  [x]  Preferred Contact Method  [x]  Phone Number(s)   []  MyChart     Thank you,   Vernie Ammons  [facility]   [phone]

## 2018-11-26 NOTE — Unmapped (Signed)
ATTENTION [name]  SOCIAL SERVICES  TRANSPORTATION SERVICES    PLEASE ACCEPT THIS LETTER AS CONFIRMATION OF FUTURE APPOINTMENTS FOR:    Calvin Obrien, DOB 02/02/1951  AT THE [facility]   ON 11/28/18 IN [place]    THANK YOU  Mendi Constable RN MSN  PER DIEM NURSE NAVIGATOR

## 2018-11-28 ENCOUNTER — Encounter: Admit: 2018-11-28 | Discharge: 2018-11-28 | Payer: MEDICARE

## 2018-11-28 ENCOUNTER — Encounter: Admit: 2018-11-28 | Discharge: 2018-11-28 | Payer: MEDICARE | Attending: Medical Oncology | Primary: Medical Oncology

## 2018-11-28 DIAGNOSIS — C7951 Secondary malignant neoplasm of bone: Secondary | ICD-10-CM

## 2018-11-28 DIAGNOSIS — C61 Malignant neoplasm of prostate: Principal | ICD-10-CM

## 2018-11-28 LAB — COMPREHENSIVE METABOLIC PANEL
ALBUMIN: 4.1 g/dL (ref 3.5–5.0)
ALKALINE PHOSPHATASE: 96 U/L (ref 38–126)
ANION GAP: 9 mmol/L (ref 7–15)
AST (SGOT): 24 U/L (ref 19–55)
BILIRUBIN TOTAL: 0.4 mg/dL (ref 0.0–1.2)
BLOOD UREA NITROGEN: 24 mg/dL — ABNORMAL HIGH (ref 7–21)
BUN / CREAT RATIO: 20
CHLORIDE: 102 mmol/L (ref 98–107)
CO2: 26 mmol/L (ref 22.0–30.0)
CREATININE: 1.22 mg/dL (ref 0.70–1.30)
EGFR CKD-EPI AA MALE: 69 mL/min/{1.73_m2} (ref >=60)
EGFR CKD-EPI NON-AA MALE: 60 mL/min/{1.73_m2} (ref >=60)
GLUCOSE RANDOM: 96 mg/dL (ref 70–179)
POTASSIUM: 4.5 mmol/L (ref 3.5–5.0)
PROTEIN TOTAL: 7.1 g/dL (ref 6.5–8.3)
SODIUM: 137 mmol/L (ref 135–145)

## 2018-11-28 LAB — PROTEIN TOTAL: Protein:MCnc:Pt:Ser/Plas:Qn:: 7.1

## 2018-11-28 LAB — PROSTATE SPECIFIC ANTIGEN: Prostate specific Ag:MCnc:Pt:Ser/Plas:Qn:: <0.1

## 2018-11-28 NOTE — Unmapped (Signed)
Venipuncture right wrist, labs drawn and sent.  To next appt.

## 2018-11-29 NOTE — Patient Instructions (Signed)
Willie Fowler  11/29/2018     @PREFPERIOPPHARMACY @   Your procedure is scheduled on  12/05/2018.  Report to [facility] at  0800  A.M.  Call this number if you have problems the morning of surgery:  [phone]   Remember:  Follow the diet and prep instructions given to you by Dr Roseanne Kaufman office.                      Take these medicines the morning of surgery with A SIP OF WATER  Gabapentin, prilosec, ditropan, prednisone, lyrica, flomax, verapamil, tramadol(if needed). Use your inhaler before you come.    Do not wear jewelry, make-up or nail polish.  Do not wear lotions, powders, or perfumes. Please wear deodorant and brush your teeth.  Do not shave 48 hours prior to surgery.  Men may shave face and neck.  Do not bring valuables to the hospital.  [facility] is not responsible for any belongings or valuables.  Contacts, dentures or bridgework may not be worn into surgery.  Leave your suitcase in the car.  After surgery it may be brought to your room.  For patients admitted to the hospital, discharge time will be determined by your treatment team.  Patients discharged the day of surgery will not be allowed to drive home.   Name and phone number of your driver:   family Special instructions:  None  Please read over the following fact sheets that you were given. Anesthesia Post-op Instructions and Care and Recovery After Surgery       Upper Endoscopy, Adult, Care After This sheet gives you information about how to care for yourself after your procedure. Your health care provider may also give you more specific instructions. If you have problems or questions, contact your health care provider. What can I expect after the procedure? After the procedure, it is common to have:  A sore throat.  Mild stomach pain or discomfort.  Bloating.  Nausea. Follow these instructions at home:   Follow instructions from your health care provider about what to eat or  drink after your procedure.  Return to your normal activities as told by your health care provider. Ask your health care provider what activities are safe for you.  Take over-the-counter and prescription medicines only as told by your health care provider.  Do not drive for 24 hours if you were given a sedative during your procedure.  Keep all follow-up visits as told by your health care provider. This is important. Contact a health care provider if you have:  A sore throat that lasts longer than one day.  Trouble swallowing. Get help right away if:  You vomit blood or your vomit looks like coffee grounds.  You have: ? A fever. ? Bloody, black, or tarry stools. ? A severe sore throat or you cannot swallow. ? Difficulty breathing. ? Severe pain in your chest or abdomen. Summary  After the procedure, it is common to have a sore throat, mild stomach discomfort, bloating, and nausea.  Do not drive for 24 hours if you were given a sedative during the procedure.  Follow instructions from your health care provider about what to eat or drink after your procedure.  Return to your normal activities as told by your health care provider. This information is not intended to replace advice given to you by your health care provider. Make sure you discuss any questions you have  with your health care provider. Document Released: 09/19/2011 Document Revised: 09/11/2017 Document Reviewed: 08/20/2017 Elsevier Patient Education  2020 [employer].  Colonoscopy, Adult, Care After This sheet gives you information about how to care for yourself after your procedure. Your health care provider may also give you more specific instructions. If you have problems or questions, contact your health care provider. What can I expect after the procedure? After the procedure, it is common to have:  A small amount of blood in your stool for 24 hours after the procedure.  Some gas.  Mild abdominal cramping  or bloating. Follow these instructions at home: General instructions  For the first 24 hours after the procedure: ? Do not drive or use machinery. ? Do not sign important documents. ? Do not drink alcohol. ? Do your regular daily activities at a slower pace than normal. ? Eat soft, easy-to-digest foods.  Take over-the-counter or prescription medicines only as told by your health care provider. Relieving cramping and bloating   Try walking around when you have cramps or feel bloated.  Apply heat to your abdomen as told by your health care provider. Use a heat source that your health care provider recommends, such as a moist heat pack or a heating pad. ? Place a towel between your skin and the heat source. ? Leave the heat on for 20-30 minutes. ? Remove the heat if your skin turns bright red. This is especially important if you are unable to feel pain, heat, or cold. You may have a greater risk of getting burned. Eating and drinking   Drink enough fluid to keep your urine pale yellow.  Resume your normal diet as instructed by your health care provider. Avoid heavy or fried foods that are hard to digest.  Avoid drinking alcohol for as long as instructed by your health care provider. Contact a health care provider if:  You have blood in your stool 2-3 days after the procedure. Get help right away if:  You have more than a small spotting of blood in your stool.  You pass large blood clots in your stool.  Your abdomen is swollen.  You have nausea or vomiting.  You have a fever.  You have increasing abdominal pain that is not relieved with medicine. Summary  After the procedure, it is common to have a small amount of blood in your stool. You may also have mild abdominal cramping and bloating.  For the first 24 hours after the procedure, do not drive or use machinery, sign important documents, or drink alcohol.  Contact your health care provider if you have a lot of blood  in your stool, nausea or vomiting, a fever, or increased abdominal pain. This information is not intended to replace advice given to you by your health care provider. Make sure you discuss any questions you have with your health care provider. Document Released: 11/02/2003 Document Revised: 01/10/2017 Document Reviewed: 06/01/2015 Elsevier Patient Education  2020 [place] After These instructions provide you with information about caring for yourself after your procedure. Your health care provider may also give you more specific instructions. Your treatment has been planned according to current medical practices, but problems sometimes occur. Call your health care provider if you have any problems or questions after your procedure. What can I expect after the procedure? After your procedure, you may:  Feel sleepy for several hours.  Feel clumsy and have poor balance for several hours.  Feel forgetful about  what happened after the procedure.  Have poor judgment for several hours.  Feel nauseous or vomit.  Have a sore throat if you had a breathing tube during the procedure. Follow these instructions at home: For at least 24 hours after the procedure:      Have a responsible adult stay with you. It is important to have someone help care for you until you are awake and alert.  Rest as needed.  Do not: ? Participate in activities in which you could fall or become injured. ? Drive. ? Use heavy machinery. ? Drink alcohol. ? Take sleeping pills or medicines that cause drowsiness. ? Make important decisions or sign legal documents. ? Take care of children on your own. Eating and drinking  Follow the diet that is recommended by your health care provider.  If you vomit, drink water, juice, or soup when you can drink without vomiting.  Make sure you have little or no nausea before eating solid foods. General instructions  Take over-the-counter  and prescription medicines only as told by your health care provider.  If you have sleep apnea, surgery and certain medicines can increase your risk for breathing problems. Follow instructions from your health care provider about wearing your sleep device: ? Anytime you are sleeping, including during daytime naps. ? While taking prescription pain medicines, sleeping medicines, or medicines that make you drowsy.  If you smoke, do not smoke without supervision.  Keep all follow-up visits as told by your health care provider. This is important. Contact a health care provider if:  You keep feeling nauseous or you keep vomiting.  You feel light-headed.  You develop a rash.  You have a fever. Get help right away if:  You have trouble breathing. Summary  For several hours after your procedure, you may feel sleepy and have poor judgment.  Have a responsible adult stay with you for at least 24 hours or until you are awake and alert. This information is not intended to replace advice given to you by your health care provider. Make sure you discuss any questions you have with your health care provider. Document Released: 07/11/2015 Document Revised: 06/18/2017 Document Reviewed: 07/11/2015 Elsevier Patient Education  2020 [employer].

## 2018-12-03 ENCOUNTER — Other Ambulatory Visit (HOSPITAL_COMMUNITY)
Admission: RE | Admit: 2018-12-03 | Discharge: 2018-12-03 | Disposition: A | Discharge status: Home / Self Care | Payer: Medicare Other | Source: Ambulatory Visit | Attending: Internal Medicine | Admitting: Internal Medicine

## 2018-12-03 ENCOUNTER — Encounter (HOSPITAL_COMMUNITY): Payer: Self-pay

## 2018-12-03 ENCOUNTER — Encounter (HOSPITAL_COMMUNITY)
Admission: RE | Admit: 2018-12-03 | Discharge: 2018-12-03 | Disposition: A | Discharge status: Home / Self Care | Payer: Medicare Other | Source: Ambulatory Visit | Attending: Internal Medicine | Admitting: Internal Medicine

## 2018-12-03 ENCOUNTER — Other Ambulatory Visit: Payer: Self-pay

## 2018-12-03 ENCOUNTER — Telehealth: Payer: Self-pay | Admitting: *Deleted

## 2018-12-03 DIAGNOSIS — K635 Polyp of colon: Secondary | ICD-10-CM | POA: Diagnosis not present

## 2018-12-03 DIAGNOSIS — Z20822 Contact with and (suspected) exposure to covid-19: Secondary | ICD-10-CM

## 2018-12-03 DIAGNOSIS — I10 Essential (primary) hypertension: Secondary | ICD-10-CM | POA: Diagnosis not present

## 2018-12-03 DIAGNOSIS — K5730 Diverticulosis of large intestine without perforation or abscess without bleeding: Secondary | ICD-10-CM | POA: Diagnosis not present

## 2018-12-03 DIAGNOSIS — Z85038 Personal history of other malignant neoplasm of large intestine: Secondary | ICD-10-CM | POA: Diagnosis not present

## 2018-12-03 DIAGNOSIS — Z79899 Other long term (current) drug therapy: Secondary | ICD-10-CM | POA: Diagnosis not present

## 2018-12-03 DIAGNOSIS — C61 Malignant neoplasm of prostate: Secondary | ICD-10-CM | POA: Diagnosis not present

## 2018-12-03 DIAGNOSIS — F17210 Nicotine dependence, cigarettes, uncomplicated: Secondary | ICD-10-CM | POA: Diagnosis not present

## 2018-12-03 DIAGNOSIS — Z09 Encounter for follow-up examination after completed treatment for conditions other than malignant neoplasm: Secondary | ICD-10-CM | POA: Diagnosis present

## 2018-12-03 DIAGNOSIS — K219 Gastro-esophageal reflux disease without esophagitis: Secondary | ICD-10-CM | POA: Diagnosis not present

## 2018-12-03 DIAGNOSIS — E7800 Pure hypercholesterolemia, unspecified: Secondary | ICD-10-CM | POA: Diagnosis not present

## 2018-12-03 DIAGNOSIS — Z96612 Presence of left artificial shoulder joint: Secondary | ICD-10-CM | POA: Diagnosis not present

## 2018-12-03 DIAGNOSIS — Z8719 Personal history of other diseases of the digestive system: Secondary | ICD-10-CM | POA: Diagnosis not present

## 2018-12-03 NOTE — Telephone Encounter (Signed)
Called patient. He states he is in the parking lot now over at the hospital. Called endo and made aware

## 2018-12-03 NOTE — Telephone Encounter (Signed)
-----   Message from Encarnacion Chu, RN sent at 12/03/2018  9:33 AM EDT ----- Regarding: No show Hey Mindy! Mr Sedam did not show for his PAT this morning.

## 2018-12-04 ENCOUNTER — Telehealth: Payer: Self-pay

## 2018-12-04 LAB — NOVEL CORONAVIRUS, NAA: SARS-CoV-2, NAA: NOT DETECTED

## 2018-12-04 NOTE — Telephone Encounter (Signed)
Endo scheduler called office. Community COVID test was done yesterday. Results will not be back in time for TCS tomorrow. Cleo at [facility] advised pt will need to arrive tomorrow at 8:00am for rapid COVID test. Procedure time will need to be changed to 10:45am. Pt will need to drink all of prep tonight.  Tried to call mobile number x2, no answer, unable to leave VM d/t mailbox is full. No answer at home number and no answering machine.

## 2018-12-04 NOTE — Telephone Encounter (Signed)
Spoke to pt, he will arrive tomorrow morning at 8:00am. Pt gave phone to [place]. Mechele Dawley instructions for pt to drink all of TCS prep today. She wrote down instructions. Pt also aware.

## 2018-12-04 NOTE — Telephone Encounter (Signed)
Melanie at [facility] called office and said pt was no show for COVID test yesterday. TCS/-/+EGD w/Propofol w/RMR will need to be rescheduled d/t testing site is closed today.  Called pt, he states he done COVID test yesterday. Tried to call Threasa Beards back but she was unavailable. Left message with Tammy.

## 2018-12-05 ENCOUNTER — Ambulatory Visit (HOSPITAL_COMMUNITY): Payer: Medicare Other | Admitting: Anesthesiology

## 2018-12-05 ENCOUNTER — Ambulatory Visit (HOSPITAL_COMMUNITY)
Admission: RE | Admit: 2018-12-05 | Discharge: 2018-12-05 | Disposition: A | Discharge status: Home / Self Care | Payer: Medicare Other | Source: Ambulatory Visit | Attending: Internal Medicine | Admitting: Internal Medicine

## 2018-12-05 ENCOUNTER — Other Ambulatory Visit: Payer: Self-pay

## 2018-12-05 ENCOUNTER — Encounter (HOSPITAL_COMMUNITY)
Admission: RE | Disposition: A | Discharge status: Home / Self Care | Payer: Self-pay | Source: Ambulatory Visit | Attending: Internal Medicine

## 2018-12-05 ENCOUNTER — Other Ambulatory Visit (HOSPITAL_COMMUNITY)
Admission: RE | Admit: 2018-12-05 | Discharge: 2018-12-05 | Disposition: A | Discharge status: Home / Self Care | Payer: Medicare Other | Source: Ambulatory Visit | Attending: Internal Medicine | Admitting: Internal Medicine

## 2018-12-05 ENCOUNTER — Encounter (HOSPITAL_COMMUNITY): Payer: Self-pay | Admitting: *Deleted

## 2018-12-05 DIAGNOSIS — D125 Benign neoplasm of sigmoid colon: Secondary | ICD-10-CM

## 2018-12-05 DIAGNOSIS — K635 Polyp of colon: Secondary | ICD-10-CM | POA: Diagnosis not present

## 2018-12-05 DIAGNOSIS — Z86010 Personal history of colonic polyps: Secondary | ICD-10-CM | POA: Diagnosis not present

## 2018-12-05 DIAGNOSIS — Z79899 Other long term (current) drug therapy: Secondary | ICD-10-CM | POA: Insufficient documentation

## 2018-12-05 DIAGNOSIS — Z8719 Personal history of other diseases of the digestive system: Secondary | ICD-10-CM | POA: Diagnosis not present

## 2018-12-05 DIAGNOSIS — C61 Malignant neoplasm of prostate: Secondary | ICD-10-CM | POA: Insufficient documentation

## 2018-12-05 DIAGNOSIS — E7800 Pure hypercholesterolemia, unspecified: Secondary | ICD-10-CM | POA: Insufficient documentation

## 2018-12-05 DIAGNOSIS — K5730 Diverticulosis of large intestine without perforation or abscess without bleeding: Secondary | ICD-10-CM | POA: Insufficient documentation

## 2018-12-05 DIAGNOSIS — F17210 Nicotine dependence, cigarettes, uncomplicated: Secondary | ICD-10-CM | POA: Insufficient documentation

## 2018-12-05 DIAGNOSIS — Z09 Encounter for follow-up examination after completed treatment for conditions other than malignant neoplasm: Secondary | ICD-10-CM | POA: Insufficient documentation

## 2018-12-05 DIAGNOSIS — D649 Anemia, unspecified: Secondary | ICD-10-CM

## 2018-12-05 DIAGNOSIS — Z96612 Presence of left artificial shoulder joint: Secondary | ICD-10-CM | POA: Insufficient documentation

## 2018-12-05 DIAGNOSIS — K219 Gastro-esophageal reflux disease without esophagitis: Secondary | ICD-10-CM | POA: Insufficient documentation

## 2018-12-05 DIAGNOSIS — Z85038 Personal history of other malignant neoplasm of large intestine: Secondary | ICD-10-CM | POA: Insufficient documentation

## 2018-12-05 DIAGNOSIS — I10 Essential (primary) hypertension: Secondary | ICD-10-CM | POA: Insufficient documentation

## 2018-12-05 HISTORY — PX: COLONOSCOPY WITH PROPOFOL: SHX5780

## 2018-12-05 HISTORY — PX: POLYPECTOMY: SHX5525

## 2018-12-05 SURGERY — COLONOSCOPY WITH PROPOFOL
Anesthesia: General

## 2018-12-05 MED ORDER — ONDANSETRON HCL 4 MG/2ML IJ SOLN
INTRAMUSCULAR | Status: AC
  Filled 2018-12-05: qty 2

## 2018-12-05 MED ORDER — PROPOFOL 10 MG/ML IV BOLUS
INTRAVENOUS | Status: AC
  Filled 2018-12-05: qty 20

## 2018-12-05 MED ORDER — CHLORHEXIDINE GLUCONATE CLOTH 2 % EX PADS: 6.0000 | MEDICATED_PAD | Freq: Once | CUTANEOUS | Status: DC

## 2018-12-05 MED ORDER — LACTATED RINGERS IV SOLN
INTRAVENOUS | Status: DC
  Administered 2018-12-05: 1000 mL via INTRAVENOUS

## 2018-12-05 MED ORDER — ONDANSETRON HCL 4 MG/2ML IJ SOLN
INTRAMUSCULAR | Status: DC | PRN
  Administered 2018-12-05: 4 mg via INTRAVENOUS

## 2018-12-05 MED ORDER — LACTATED RINGERS IV SOLN
INTRAVENOUS | Status: DC | PRN
  Administered 2018-12-05: 1000 mL
  Administered 2018-12-05: 10:00:00 via INTRAVENOUS

## 2018-12-05 MED ORDER — PROPOFOL 500 MG/50ML IV EMUL
INTRAVENOUS | Status: DC | PRN
  Administered 2018-12-05: 150 ug/kg/min via INTRAVENOUS

## 2018-12-05 MED ORDER — GLYCOPYRROLATE 0.2 MG/ML IJ SOLN
INTRAMUSCULAR | Status: DC | PRN
  Administered 2018-12-05: 0.2 mg via INTRAVENOUS

## 2018-12-05 MED ORDER — PHENYLEPHRINE HCL (PRESSORS) 10 MG/ML IV SOLN
INTRAVENOUS | Status: DC | PRN
  Administered 2018-12-05 (×2): 100 ug via INTRAVENOUS

## 2018-12-05 NOTE — Transfer of Care (Signed)
Immediate Anesthesia Transfer of Care Note  Patient: Willie Fowler  Procedure(s) Performed: COLONOSCOPY WITH PROPOFOL (N/A ) POLYPECTOMY  Patient Location: PACU  Anesthesia Type:MAC  Level of Consciousness: awake, alert  and oriented  Airway & Oxygen Therapy: Patient Spontanous Breathing and Patient connected to nasal cannula oxygen  Post-op Assessment: Report given to RN and Post -op Vital signs reviewed and stable  Post vital signs: Reviewed and stable  Last Vitals:  Vitals Value Taken Time  BP    Temp    Pulse    Resp    SpO2      Last Pain:  Vitals:   12/05/18 1051  TempSrc:   PainSc: 0-No pain      Patients Stated Pain Goal: 8 (12/81/18 8677)  Complications: No apparent anesthesia complications

## 2018-12-05 NOTE — H&P (Signed)
@LOGO @   Primary Care Physician:  Abran Richard, MD Primary Gastroenterologist:  Dr. Gala Romney  Pre-Procedure History & Physical: HPI:  Willie Fowler is a 68 y.o. male here for surveillance colonoscopy.  History of anemia in the past but that apparently has resolved with repeatedly normal H&H's.  Patient denies odynophagia, dysphagia, early satiety reflux symptoms nausea vomiting or melena.  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  Past Medical History:  Diagnosis Date  . Arthritis   . Cancer [facility])    Prostate cancer  . Chronic ankle pain   . Chronic back pain   . Chronic left shoulder pain   . GERD (gastroesophageal reflux disease)   . High cholesterol   . History of alcohol abuse    Reportedly went through withdrawals after his 2017 back surgery.  . Hypertension   . Lumbar radiculopathy   . Pain management    [facility]  . Pneumonia     Past Surgical History:  Procedure Laterality Date  . BACK SURGERY    . COLONOSCOPY  2016   Dr. Posey Pronto: two 4-6 mm sigmoid colon polyps removed (adenomatous), mild left sided diverticulosis  . EAR CYST EXCISION Left 12/18/2017   Procedure: EXCISION CYST LEFT SHOULDER;  Surgeon: Meredith Pel, MD;  Location: [facility];  Service: Orthopedics;  Laterality: Left;  . REVERSE SHOULDER ARTHROPLASTY Left 12/18/2017   Procedure: LEFT REVERSE SHOULDER ARTHROPLASTY;  Surgeon: Meredith Pel, MD;  Location: [facility];  Service: Orthopedics;  Laterality: Left;  . TONSILLECTOMY      Prior to Admission medications   Medication Sig Start Date End Date Taking? Authorizing Provider  enzalutamide (XTANDI) 40 MG capsule Take 160 mg by mouth daily.   Yes [provider]  ezetimibe (ZETIA) 10 MG tablet Take 10 mg by mouth daily.  08/18/11  Yes [provider]  gabapentin (NEURONTIN) 600 MG tablet Take 600 mg by mouth 3 (three) times daily.  12/19/16  Yes [provider]   hydrochlorothiazide (HYDRODIURIL) 25 MG tablet Take 25 mg by mouth daily.   Yes [provider]  lisinopril (PRINIVIL,ZESTRIL) 40 MG tablet Take 40 mg by mouth daily. 12/25/14  Yes [provider]  omeprazole (PRILOSEC) 20 MG capsule Take 20 mg by mouth daily.   Yes [provider]  oxybutynin (DITROPAN) 5 MG tablet Take 5-15 mg by mouth See admin instructions. Taking 1 tablet (5mg ) in the morning and 3 tablets (15mg ) at bedtime.   Yes [provider]  pregabalin (LYRICA) 150 MG capsule Take 150 mg by mouth 2 (two) times daily.    Yes [provider]  tamsulosin (FLOMAX) 0.4 MG CAPS capsule Take 0.4 mg by mouth daily. 04/19/12  Yes [provider]  traMADol (ULTRAM) 50 MG tablet Take 50 mg by mouth 4 (four) times daily.    Yes [provider]  VENTOLIN HFA 108 (90 BASE) MCG/ACT inhaler Inhale 2 puffs into the lungs every 4 (four) hours as needed for wheezing.  03/16/15  Yes [provider]    Allergies as of 09/23/2018  . (No Known Allergies)    Family History  Problem Relation Age of Onset  . Cancer Mother        breast  . Cancer Sister        breast  . Colon cancer Neg Hx     Social History   Socioeconomic History  . Marital status: Divorced    Spouse name: Not on  file  . Number of children: Not on file  . Years of education: Not on file  . Highest education level: Not on file  Occupational History  . Not on file  Social Needs  . Financial resource strain: Not on file  . Food insecurity    Worry: Not on file    Inability: Not on file  . Transportation needs    Medical: Not on file    Non-medical: Not on file  Tobacco Use  . Smoking status: Current Every Day Smoker    Packs/day: 0.50    Years: 40.00    Pack years: 20.00    Types: Cigarettes  . Smokeless tobacco: Never Used  Substance and Sexual Activity  . Alcohol use: Yes    Comment: weekly-  BEER (12 ounces every few days)  . Drug use: No  .  Sexual activity: Not on file  Lifestyle  . Physical activity    Days per week: Not on file    Minutes per session: Not on file  . Stress: Not on file  Relationships  . Social Herbalist on phone: Not on file    Gets together: Not on file    Attends religious service: Not on file    Active member of club or organization: Not on file    Attends meetings of clubs or organizations: Not on file    Relationship status: Not on file  . Intimate partner violence    Fear of current or ex partner: Not on file    Emotionally abused: Not on file    Physically abused: Not on file    Forced sexual activity: Not on file  Other Topics Concern  . Not on file  Social History Narrative  . Not on file    Review of Systems: See HPI, otherwise negative ROS  Physical Exam: BP 123/61   Pulse 70   Temp 98.2 F (36.8 C) (Oral)   Resp 20   SpO2 96%  General:   Alert,  Well-developed, well-nourished, pleasant and cooperative in NAD SNeck:  Supple; no masses or thyromegaly. No significant cervical adenopathy. Lungs:  Clear throughout to auscultation.   No wheezes, crackles, or rhonchi. No acute distress. Heart:  Regular rate and rhythm; no murmurs, clicks, rubs,  or gallops. Abdomen: Non-distended, normal bowel sounds.  Soft and nontender without appreciable mass or hepatosplenomegaly.  Pulses:  Normal pulses noted. Extremities:  Without clubbing or edema.  Impression/Plan: Pleasant 68 year old gent with a history of advanced adenoma, colonic adenomas removed previously.  History of anemia which is resolved.  No upper GI tract symptoms.  EGD not indicated today.  We will proceed with colonoscopy only per plan. The risks, benefits, limitations, alternatives and imponderables have been reviewed with the patient. Questions have been answered. All parties are agreeable.      Notice: This dictation was prepared with Dragon dictation along with smaller phrase technology. Any transcriptional  errors that result from this process are unintentional and may not be corrected upon review.

## 2018-12-05 NOTE — Op Note (Signed)
[facility] Patient Name: Willie Fowler Procedure Date: 12/05/2018 10:25 AM MRN: [id number] Date of Birth: 10/26/1950 Attending MD: Norvel Richards , MD CSN: [id number] Age: 68 Admit Type: Outpatient Procedure:                Colonoscopy Indications:              High risk colon cancer surveillance: Personal                            history of colonic polyps Providers:                Norvel Richards, MD, Otis Peak B. Sharon Seller, RN,                            Aram Candela Referring MD:              Medicines:                Propofol per Anesthesia Complications:            No immediate complications. Estimated Blood Loss:     Estimated blood loss was minimal. Procedure:                Pre-Anesthesia Assessment:                           - Prior to the procedure, a History and Physical                            was performed, and patient medications and                            allergies were reviewed. The patient's tolerance of                            previous anesthesia was also reviewed. The risks                            and benefits of the procedure and the sedation                            options and risks were discussed with the patient.                            All questions were answered, and informed consent                            was obtained. Prior Anticoagulants: The patient has                            taken no previous anticoagulant or antiplatelet                            agents. ASA Grade Assessment: II - A patient with  mild systemic disease. After reviewing the risks                            and benefits, the patient was deemed in                            satisfactory condition to undergo the procedure.                           After obtaining informed consent, the colonoscope                            was passed under direct vision. Throughout the                            procedure, the patient's blood  pressure, pulse, and                            oxygen saturations were monitored continuously. The                            [id number] scope was introduced through                            the anus and advanced to the the cecum, identified                            by appendiceal orifice and ileocecal valve. The                            colonoscopy was performed without difficulty. The                            patient tolerated the procedure well. The quality                            of the bowel preparation was adequate. Scope In: 10:53:12 AM Scope Out: 11:02:33 AM Scope Withdrawal Time: 0 hours 7 minutes 43 seconds  Total Procedure Duration: 0 hours 9 minutes 21 seconds  Findings:      The perianal and digital rectal examinations were normal.      Scattered medium-mouthed diverticula were found in the sigmoid colon and       descending colon.      A polyp was found in the sigmoid colon. The polyp was semi-pedunculated.       The polyp was removed with a cold snare. Resection and retrieval were       complete. Estimated blood loss was minimal.      The exam was otherwise without abnormality on direct and retroflexion       views. Impression:               - Diverticulosis in the sigmoid colon and in the                            descending colon.                           -  One polyp in the sigmoid colon, removed with a                            cold snare. Resected and retrieved.                           - The examination was otherwise normal.                           - The examination was otherwise normal on direct                            and retroflexion views. Moderate Sedation:      Moderate (conscious) sedation was personally administered by an       anesthesia professional. The following parameters were monitored: oxygen       saturation, heart rate, blood pressure, respiratory rate, EKG, adequacy       of pulmonary ventilation, and response to  care. Recommendation:           - Patient has a contact number available for                            emergencies. The signs and symptoms of potential                            delayed complications were discussed with the                            patient. Return to normal activities tomorrow.                            Written discharge instructions were provided to the                            patient.                           - Resume previous diet.                           - Continue present medications.                           - Repeat colonoscopy date to be determined after                            pending pathology results are reviewed for                            surveillance based on pathology results.                           - Return to GI office (date not yet determined). Procedure Code(s):        --- Professional ---  45385, Colonoscopy, flexible; with removal of                            tumor(s), polyp(s), or other lesion(s) by snare                            technique Diagnosis Code(s):        --- Professional ---                           Z86.010, Personal history of colonic polyps                           K63.5, Polyp of colon                           K57.30, Diverticulosis of large intestine without                            perforation or abscess without bleeding CPT copyright 2019 American Medical Association. All rights reserved. The codes documented in this report are preliminary and upon coder review may  be revised to meet current compliance requirements. Cristopher Estimable. Catlin Doria, MD Norvel Richards, MD 12/05/2018 11:10:31 AM This report has been signed electronically. Number of Addenda: 0

## 2018-12-05 NOTE — Anesthesia Postprocedure Evaluation (Signed)
Anesthesia Post Note  Patient: Willie Fowler  Procedure(s) Performed: COLONOSCOPY WITH PROPOFOL (N/A ) POLYPECTOMY  Patient location during evaluation: PACU Anesthesia Type: MAC Level of consciousness: awake and alert, oriented and patient cooperative Pain management: pain level controlled Vital Signs Assessment: post-procedure vital signs reviewed and stable Respiratory status: spontaneous breathing, nonlabored ventilation, patient connected to nasal cannula oxygen and respiratory function stable Cardiovascular status: stable Postop Assessment: no apparent nausea or vomiting Anesthetic complications: no     Last Vitals:  Vitals:   12/05/18 0928  BP: 123/61  Pulse: 70  Resp: 20  Temp: 36.8 C  SpO2: 96%    Last Pain:  Vitals:   12/05/18 1051  TempSrc:   PainSc: 0-No pain                 Jenisa Monty

## 2018-12-05 NOTE — Anesthesia Preprocedure Evaluation (Signed)
Anesthesia Evaluation  Patient identified by MRN, date of birth, ID band Patient awake    Reviewed: Allergy & Precautions, NPO status , Patient's Chart, lab work & pertinent test results  Airway Mallampati: II  TM Distance: >3 FB Neck ROM: Full    Dental no notable dental hx. (+) Teeth Intact   Pulmonary pneumonia, resolved, Current Smoker and Patient abstained from smoking.,    Pulmonary exam normal breath sounds clear to auscultation       Cardiovascular Exercise Tolerance: Poor hypertension, Pt. on medications and Pt. on home beta blockers negative cardio ROS Normal cardiovascular examII Rhythm:Regular Rate:Normal  States ET limited by Bilat ankle pain    Neuro/Psych  Neuromuscular disease negative psych ROS   GI/Hepatic Neg liver ROS, Bowel prep,GERD  Medicated and Controlled,H/o Colon Ca here for surveillance    Endo/Other  negative endocrine ROS  Renal/GU negative Renal ROS  negative genitourinary   Musculoskeletal  (+) Arthritis ,   Abdominal   Peds negative pediatric ROS (+)  Hematology negative hematology ROS (+) No current anemia Denies recent transfusions   Anesthesia Other Findings   Reproductive/Obstetrics negative OB ROS                             Anesthesia Physical Anesthesia Plan  ASA: III  Anesthesia Plan: General   Post-op Pain Management:    Induction: Intravenous  PONV Risk Score and Plan: 1 and Treatment may vary due to age or medical condition, Propofol infusion and TIVA  Airway Management Planned: Nasal Cannula and Simple Face Mask  Additional Equipment:   Intra-op Plan:   Post-operative Plan:   Informed Consent: I have reviewed the patients History and Physical, chart, labs and discussed the procedure including the risks, benefits and alternatives for the proposed anesthesia with the patient or authorized representative who has indicated his/her  understanding and acceptance.     Dental advisory given  Plan Discussed with: CRNA  Anesthesia Plan Comments: (Plan Full PPE use  Plan GA with GETA as needed d/w pt -WTP with same after Q&A)        Anesthesia Quick Evaluation

## 2018-12-05 NOTE — Discharge Instructions (Signed)
Colonoscopy Discharge Instructions  Read the instructions outlined below and refer to this sheet in the next few weeks. These discharge instructions provide you with general information on caring for yourself after you leave the hospital. Your doctor may also give you specific instructions. While your treatment has been planned according to the most current medical practices available, unavoidable complications occasionally occur. If you have any problems or questions after discharge, call Dr. Gala Romney at [phone]. ACTIVITY  You may resume your regular activity, but move at a slower pace for the next 24 hours.   Take frequent rest periods for the next 24 hours.   Walking will help get rid of the air and reduce the bloated feeling in your belly (abdomen).   No driving for 24 hours (because of the medicine (anesthesia) used during the test).    Do not sign any important legal documents or operate any machinery for 24 hours (because of the anesthesia used during the test).  NUTRITION  Drink plenty of fluids.   You may resume your normal diet as instructed by your doctor.   Begin with a light meal and progress to your normal diet. Heavy or fried foods are harder to digest and may make you feel sick to your stomach (nauseated).   Avoid alcoholic beverages for 24 hours or as instructed.  MEDICATIONS  You may resume your normal medications unless your doctor tells you otherwise.  WHAT YOU CAN EXPECT TODAY  Some feelings of bloating in the abdomen.   Passage of more gas than usual.   Spotting of blood in your stool or on the toilet paper.  IF YOU HAD POLYPS REMOVED DURING THE COLONOSCOPY:  No aspirin products for 7 days or as instructed.   No alcohol for 7 days or as instructed.   Eat a soft diet for the next 24 hours.  FINDING OUT THE RESULTS OF YOUR TEST Not all test results are available during your visit. If your test results are not back during the visit, make an appointment  with your caregiver to find out the results. Do not assume everything is normal if you have not heard from your caregiver or the medical facility. It is important for you to follow up on all of your test results.  SEEK IMMEDIATE MEDICAL ATTENTION IF:  You have more than a spotting of blood in your stool.   Your belly is swollen (abdominal distention).   You are nauseated or vomiting.   You have a temperature over 101.   You have abdominal pain or discomfort that is severe or gets worse throughout the day.    Colon Polyps  Polyps are tissue growths inside the body. Polyps can grow in many places, including the large intestine (colon). A polyp may be a round bump or a mushroom-shaped growth. You could have one polyp or several. Most colon polyps are noncancerous (benign). However, some colon polyps can become cancerous over time. Finding and removing the polyps early can help prevent this. What are the causes? The exact cause of colon polyps is not known. What increases the risk? You are more likely to develop this condition if you:  Have a family history of colon cancer or colon polyps.  Are older than 94 or older than 45 if you are African American.  Have inflammatory bowel disease, such as ulcerative colitis or Crohn's disease.  Have certain hereditary conditions, such as: ? Familial adenomatous polyposis. ? Lynch syndrome. ? Turcot syndrome. ? Peutz-Jeghers syndrome.  Are  overweight.  Smoke cigarettes.  Do not get enough exercise.  Drink too much alcohol.  Eat a diet that is high in fat and red meat and low in fiber.  Had childhood cancer that was treated with abdominal radiation. What are the signs or symptoms? Most polyps do not cause symptoms. If you have symptoms, they may include:  Blood coming from your rectum when having a bowel movement.  Blood in your stool. The stool may look dark red or black.  Abdominal pain.  A change in bowel habits, such as  constipation or diarrhea. How is this diagnosed? This condition is diagnosed with a colonoscopy. This is a procedure in which a lighted, flexible scope is inserted into the anus and then passed into the colon to examine the area. Polyps are sometimes found when a colonoscopy is done as part of routine cancer screening tests. How is this treated? Treatment for this condition involves removing any polyps that are found. Most polyps can be removed during a colonoscopy. Those polyps will then be tested for cancer. Additional treatment may be needed depending on the results of testing. Follow these instructions at home: Lifestyle  Maintain a healthy weight, or lose weight if recommended by your health care provider.  Exercise every day or as told by your health care provider.  Do not use any products that contain nicotine or tobacco, such as cigarettes and e-cigarettes. If you need help quitting, ask your health care provider.  If you drink alcohol, limit how much you have: ? 0-1 drink a day for women. ? 0-2 drinks a day for men.  Be aware of how much alcohol is in your drink. In the [place] one drink equals one 12 oz bottle of beer (355 mL), one 5 oz glass of wine (148 mL), or one 1 oz shot of hard liquor (44 mL). Eating and drinking   Eat foods that are high in fiber, such as fruits, vegetables, and whole grains.  Eat foods that are high in calcium and vitamin D, such as milk, cheese, yogurt, eggs, liver, fish, and broccoli.  Limit foods that are high in fat, such as fried foods and desserts.  Limit the amount of red meat and processed meat you eat, such as hot dogs, sausage, bacon, and lunch meats. General instructions  Keep all follow-up visits as told by your health care provider. This is important. ? This includes having regularly scheduled colonoscopies. ? Talk to your health care provider about when you need a colonoscopy. Contact a health care provider if:  You have new or  worsening bleeding during a bowel movement.  You have new or increased blood in your stool.  You have a change in bowel habits.  You lose weight for no known reason. Summary  Polyps are tissue growths inside the body. Polyps can grow in many places, including the colon.  Most colon polyps are noncancerous (benign), but some can become cancerous over time.  This condition is diagnosed with a colonoscopy.  Treatment for this condition involves removing any polyps that are found. Most polyps can be removed during a colonoscopy. This information is not intended to replace advice given to you by your health care provider. Make sure you discuss any questions you have with your health care provider. Document Released: 12/15/2003 Document Revised: 07/05/2017 Document Reviewed: 07/05/2017 Elsevier Patient Education  [employer].   Diverticulosis  Diverticulosis is a condition that develops when small pouches (diverticula) form in the wall of the  large intestine (colon). The colon is where water is absorbed and stool is formed. The pouches form when the inside layer of the colon pushes through weak spots in the outer layers of the colon. You may have a few pouches or many of them. What are the causes? The cause of this condition is not known. What increases the risk? The following factors may make you more likely to develop this condition:  Being older than age 63. Your risk for this condition increases with age. Diverticulosis is rare among people younger than age 53. By age 48, many people have it.  Eating a low-fiber diet.  Having frequent constipation.  Being overweight.  Not getting enough exercise.  Smoking.  Taking over-the-counter pain medicines, like aspirin and ibuprofen.  Having a family history of diverticulosis. What are the signs or symptoms? In most people, there are no symptoms of this condition. If you do have symptoms, they may  include:  Bloating.  Cramps in the abdomen.  Constipation or diarrhea.  Pain in the lower left side of the abdomen. How is this diagnosed? This condition is most often diagnosed during an exam for other colon problems. Because diverticulosis usually has no symptoms, it often cannot be diagnosed independently. This condition may be diagnosed by:  Using a flexible scope to examine the colon (colonoscopy).  Taking an X-ray of the colon after dye has been put into the colon (barium enema).  Doing a CT scan. How is this treated? You may not need treatment for this condition if you have never developed an infection related to diverticulosis. If you have had an infection before, treatment may include:  Eating a high-fiber diet. This may include eating more fruits, vegetables, and grains.  Taking a fiber supplement.  Taking a live bacteria supplement (probiotic).  Taking medicine to relax your colon.  Taking antibiotic medicines. Follow these instructions at home:  Drink 6-8 glasses of water or more each day to prevent constipation.  Try not to strain when you have a bowel movement.  If you have had an infection before: ? Eat more fiber as directed by your health care provider or your diet and nutrition specialist (dietitian). ? Take a fiber supplement or probiotic, if your health care provider approves.  Take over-the-counter and prescription medicines only as told by your health care provider.  If you were prescribed an antibiotic, take it as told by your health care provider. Do not stop taking the antibiotic even if you start to feel better.  Keep all follow-up visits as told by your health care provider. This is important. Contact a health care provider if:  You have pain in your abdomen.  You have bloating.  You have cramps.  You have not had a bowel movement in 3 days. Get help right away if:  Your pain gets worse.  Your bloating becomes very bad.  You have a  fever or chills, and your symptoms suddenly get worse.  You vomit.  You have bowel movements that are bloody or black.  You have bleeding from your rectum. Summary  Diverticulosis is a condition that develops when small pouches (diverticula) form in the wall of the large intestine (colon).  You may have a few pouches or many of them.  This condition is most often diagnosed during an exam for other colon problems.  If you have had an infection related to diverticulosis, treatment may include increasing the fiber in your diet, taking supplements, or taking medicines. This information  is not intended to replace advice given to you by your health care provider. Make sure you discuss any questions you have with your health care provider. Document Released: 12/16/2003 Document Revised: 03/02/2017 Document Reviewed: 02/07/2016 Elsevier Patient Education  2020 [employer].   Colon polyp and diverticulosis information provided  Further recommendations to follow pending review of pathology report  At patient's request I called Willie Fowler [phone]; got no answer.

## 2018-12-11 ENCOUNTER — Encounter (HOSPITAL_COMMUNITY): Payer: Self-pay | Admitting: Internal Medicine

## 2018-12-11 NOTE — Unmapped (Signed)
[facility] Refill Coordination Note    Specialty Medication(s) to be Shipped:   Hematology/Oncology: Calvin Obrien 40 mg       Calvin Obrien, DOB: [birth year]  Phone: [phone] (home)       All above HIPAA information was verified with patient.     Completed refill call assessment today to schedule patient's medication shipment from the [facility] [phone]).       Specialty medication(s) and dose(s) confirmed: Regimen is correct and unchanged.   Changes to medications: Calvin Obrien reports no changes at this time.  Changes to insurance: No  Questions for the pharmacist: No    Confirmed patient received Welcome Packet with first shipment. The patient will receive a drug information handout for each medication shipped and additional FDA Medication Guides as required.       DISEASE/MEDICATION-SPECIFIC INFORMATION        N/A    SPECIALTY MEDICATION ADHERENCE     Medication Adherence    Patient reported X missed doses in the last month: 0  Specialty Medication: Xtandi 40 mg  Patient is on additional specialty medications: No  Patient is on more than two specialty medications: No  Any gaps in refill history greater than 2 weeks in the last 3 months: no  Demonstrates understanding of importance of adherence: yes  Informant: patient  Reliability of informant: reliable  Confirmed plan for next specialty medication refill: delivery by pharmacy          Refill Coordination    Has the Patients' Contact Information Changed: No  Is the Shipping Address Different: No           Xtandi 40 mg  : 5 days of medicine on hand       SHIPPING     Shipping address confirmed in Epic.     Delivery Scheduled: Yes, Expected medication delivery date: 12/13/2018.     Medication will be delivered via UPS to the prescription address in Epic WAM.    Calvin Obrien   [facility] Specialty Technician

## 2018-12-12 ENCOUNTER — Encounter: Payer: Self-pay | Admitting: Internal Medicine

## 2018-12-12 MED FILL — XTANDI 40 MG CAPSULE: ORAL | 30 days supply | Qty: 120 | Fill #4

## 2018-12-12 MED FILL — XTANDI 40 MG CAPSULE: 30 days supply | Qty: 120 | Fill #4 | Status: AC

## 2018-12-15 ENCOUNTER — Encounter: Payer: Self-pay | Admitting: Internal Medicine

## 2018-12-16 ENCOUNTER — Other Ambulatory Visit (HOSPITAL_COMMUNITY): Payer: Medicare Other

## 2019-01-22 NOTE — Unmapped (Signed)
[facility] Refill Coordination Note    Specialty Medication(s) to be Shipped:   Hematology/Oncology: [name]    Other medication(s) to be shipped:       Calvin Obrien, DOB: 04-21-1950  Phone: [phone] (home)       All above HIPAA information was verified with patient.     Completed refill call assessment today to schedule patient's medication shipment from the [facility] [phone]).       Specialty medication(s) and dose(s) confirmed: Regimen is correct and unchanged.   Changes to medications: Calvin Obrien reports no changes at this time.  Changes to insurance: No  Questions for the pharmacist: No    Confirmed patient received Welcome Packet with first shipment. The patient will receive a drug information handout for each medication shipped and additional FDA Medication Guides as required.       DISEASE/MEDICATION-SPECIFIC INFORMATION        N/A    SPECIALTY MEDICATION ADHERENCE     Medication Adherence    Patient reported X missed doses in the last month: 10  Specialty Medication: Xtandi 40mg   Patient is on additional specialty medications: No  Informant: patient                Xtandi 40 mg: 0 days of medicine on hand         SHIPPING     Shipping address confirmed in Epic.     Delivery Scheduled: Yes, Expected medication delivery date: 10/30.     Medication will be delivered via UPS to the home address in Epic WAM.    Antonietta Barcelona   [facility] has made a third and final attempt to reach this patient to refill the following medication:Xtandi.      We have left voicemails on the following phone numbers: [phone]  and have been unable to leave messages on the following phone numbers: [phone] .    Dates contacted: 09/30,10/12,10/21  Last scheduled delivery: 09/10 The patient may be at risk of non-compliance with this medication. The patient should call the [facility] at [phone] (option 4) to refill medication.    Antonietta Barcelona   [facility] Specialty Technician

## 2019-01-23 NOTE — Unmapped (Addendum)
Per [facility] :  Calvin Obrien was started on enzalutamide back in May and an appt with Dr.Basch. He then no showed to his appointment in September. He has not answered the phone for refills of his xtandi (they have attempted 3 times). He also has no f/u scheduled with [place] except ADT nurse visit scheduled on 11/30 0900.    01/24/19 Navigator Plan of care:  - Contact patient and request that he call the [facility] at [phone] (option 4) to refill medication.   Last xtandi shipment was 12/12/18.  - Add in person lab visit to 11/30  - Assist with DSS transportation for 11/30 as needed  - Set up virtual return visit with Dr.Basch for ~ 03/05/19 if reliable phone service is confirmed.    Call attempts:  To pt: mobile # - mailbox full  To pt: home # - no answer  To [employer] sister, left detailed VM, with pharm and 4-0000 call back numbers.    Will follow-up on 10/26.     Lucia Gaskins RN MSN    01/27/19:  To pt: mobile # -  mailbox full  To pt: home # - no answer.  To [facility] EC: left detailed VM with 4-0000 call back number.     Will add lab draw to 11/30 appt date and request a mailed letter to the home.    Lucia Gaskins RN MSN

## 2019-01-29 NOTE — Unmapped (Signed)
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    Thanks,  [employer]

## 2019-01-30 MED FILL — XTANDI 40 MG CAPSULE: ORAL | 30 days supply | Qty: 120 | Fill #5

## 2019-01-30 MED FILL — XTANDI 40 MG CAPSULE: 30 days supply | Qty: 120 | Fill #5 | Status: AC

## 2019-01-30 NOTE — Unmapped (Signed)
01/30/19 Navigator Plan of care:     - Contacted patient and requested that he call the [facility] at [phone] (option 4) to refill medication. ?? Last xtandi shipment was 12/12/18, over due for refill.  He agreed to call them now.  He has been out of the [place] for  a week or two. Reviewed that he must confirm per phone call every month to request the next shipment.     Also called specialty pharm to alert them that pt is now available for call back.     - Confirmed next appts on 11/30:  Labs + ADT injection.     Lucia Gaskins RN MSN  Per diem NN

## 2019-02-19 ENCOUNTER — Other Ambulatory Visit (HOSPITAL_COMMUNITY): Payer: Self-pay | Admitting: Internal Medicine

## 2019-02-19 DIAGNOSIS — I739 Peripheral vascular disease, unspecified: Secondary | ICD-10-CM

## 2019-02-21 NOTE — Unmapped (Signed)
[facility] Clinical Assessment & Refill Coordination Note    NIMESH RIOLO, DOB: September 10, 1950  Phone: [phone] (home)     All above HIPAA information was verified with patient.     Specialty Medication(s):   Hematology/Oncology: [name]     Current Outpatient Medications   Medication Sig Dispense Refill   ??? aspirin (ECOTRIN) 81 MG tablet Take 81 mg by mouth daily.     ??? azithromycin (ZITHROMAX) 250 MG tablet Take 500 mg by mouth daily. For 4 days     ??? benzonatate (TESSALON) 100 MG capsule Take 200 mg by mouth Three (3) times a day as needed.     ??? enzalutamide (XTANDI) 40 mg capsule Take 4 capsules (160 mg) by mouth daily. 120 capsule 6   ??? ezetimibe (ZETIA) 10 mg tablet Take 10 mg by mouth daily. Frequency:QD   Dosage:10   MG  Instructions:  Note:Dose: 10MG      ??? gabapentin (NEURONTIN) 600 MG tablet Take 600 mg by mouth Two (2) times a day.     ??? hydroCHLOROthiazide (HYDRODIURIL) 25 MG tablet Take 1 tablet (25 mg total) by mouth daily. 30 tablet 11   ??? lisinopril (PRINIVIL,ZESTRIL) 40 MG tablet Take 40 mg by mouth daily.      ??? methylphenidate HCl (RITALIN) 10 MG tablet Take 1 tablet (10 mg total) by mouth daily. 30 tablet 0   ??? mirtazapine (REMERON) 7.5 MG tablet Take 1 tablet (7.5 mg total) by mouth nightly. 30 tablet 1   ??? omeprazole (PRILOSEC) 20 MG capsule Take 20 mg by mouth daily.     ??? oxybutynin (DITROPAN) 5 MG tablet Take 1 tablet (5 mg total) by mouth Three (3) times a day.  0   ??? oxyCODONE (ROXICODONE) 5 MG immediate release tablet Take 1 tablet (5 mg total) by mouth every four (4) hours as needed for pain. for up to 10 doses 5 tablet 0   ??? pregabalin (LYRICA) 150 MG capsule Take 1 capsule (150 mg total) by mouth three (3) times a day (at 6am, noon and 6pm). 90 capsule 1   ??? tamsulosin (FLOMAX) 0.4 mg capsule Take 1 capsule (0.4 mg total) by mouth daily. 30 capsule 11   ??? traMADol (ULTRAM) 50 mg tablet Take 50 mg by mouth every six (6) hours as needed for pain. ??? triamcinolone (KENALOG) 0.1 % cream daily.     ??? VENTOLIN HFA 90 mcg/actuation inhaler Inhale 1 puff every four (4) hours as needed.        No current facility-administered medications for this visit.         Changes to medications: Arlie reports no changes at this time.    No Known Allergies    Changes to allergies: No    SPECIALTY MEDICATION ADHERENCE     Xtandi 40 mg: 7-8 days days of medicine on hand     Medication Adherence    Specialty Medication: Xtandi  Informant: patient  Confirmed plan for next specialty medication refill: delivery by pharmacy          Specialty medication(s) dose(s) confirmed: Regimen is correct and unchanged.     Are there any concerns with adherence? No    Adherence counseling provided? Not needed    CLINICAL MANAGEMENT AND INTERVENTION      Clinical Benefit Assessment:    Do you feel the medicine is effective or helping your condition? Yes    Clinical Benefit counseling provided? Not needed  Adverse Effects Assessment:    Are you experiencing any side effects? Yes, patient reports experiencing blood blister on right hand and corn on left hand. Side effect counseling provided: counseled to monitor    Are you experiencing difficulty administering your medicine? No    Quality of Life Assessment:    How many days over the past month did your prostate cancer  keep you from your normal activities? For example, brushing your teeth or getting up in the morning. 0    Have you discussed this with your provider? Not needed    Therapy Appropriateness:    Is therapy appropriate? Yes, therapy is appropriate and should be continued    DISEASE/MEDICATION-SPECIFIC INFORMATION      N/A    PATIENT SPECIFIC NEEDS     ? Does the patient have any physical, cognitive, or cultural barriers? No    ? Is the patient high risk? No     ? Does the patient require a Care Management Plan? No ? Does the patient require physician intervention or other additional services (i.e. nutrition, smoking cessation, social work)? No      SHIPPING     Specialty Medication(s) to be Shipped:   Hematology/Oncology: [name]    Other medication(s) to be shipped: none     Changes to insurance: No    Delivery Scheduled: Yes, Expected medication delivery date: 02/26/19.     Medication will be delivered via UPS to the confirmed prescription address in [facility].    The patient will receive a drug information handout for each medication shipped and additional FDA Medication Guides as required.  Verified that patient has previously received a Conservation officer, historic buildings.    All of the patient's questions and concerns have been addressed.    [name] Shared [facility] Specialty Pharmacist

## 2019-02-24 ENCOUNTER — Ambulatory Visit (HOSPITAL_COMMUNITY): Admission: RE | Admit: 2019-02-24 | Payer: Medicare Other | Source: Ambulatory Visit

## 2019-02-25 MED FILL — XTANDI 40 MG CAPSULE: ORAL | 30 days supply | Qty: 120 | Fill #6

## 2019-02-25 MED FILL — XTANDI 40 MG CAPSULE: 30 days supply | Qty: 120 | Fill #6 | Status: AC

## 2019-03-03 ENCOUNTER — Encounter: Admit: 2019-03-03 | Discharge: 2019-03-04 | Payer: MEDICARE

## 2019-03-03 ENCOUNTER — Other Ambulatory Visit: Admit: 2019-03-03 | Discharge: 2019-03-04 | Payer: MEDICARE

## 2019-03-03 LAB — PROSTATE SPECIFIC ANTIGEN: Prostate specific Ag:MCnc:Pt:Ser/Plas:Qn:: 0.11

## 2019-03-03 LAB — COMPREHENSIVE METABOLIC PANEL
ALBUMIN: 4.3 g/dL (ref 3.5–5.0)
ALKALINE PHOSPHATASE: 115 U/L (ref 38–126)
ALT (SGPT): 13 U/L (ref <50)
ANION GAP: 13 mmol/L (ref 7–15)
BILIRUBIN TOTAL: 0.5 mg/dL (ref 0.0–1.2)
BUN / CREAT RATIO: 23
CALCIUM: 9.6 mg/dL (ref 8.5–10.2)
CHLORIDE: 101 mmol/L (ref 98–107)
CO2: 26 mmol/L (ref 22.0–30.0)
CREATININE: 1.27 mg/dL (ref 0.70–1.30)
EGFR CKD-EPI AA MALE: 66 mL/min/{1.73_m2} (ref >=60)
EGFR CKD-EPI NON-AA MALE: 57 mL/min/{1.73_m2} — ABNORMAL LOW (ref >=60)
GLUCOSE RANDOM: 97 mg/dL (ref 70–179)
POTASSIUM: 4.4 mmol/L (ref 3.5–5.0)
PROTEIN TOTAL: 7.5 g/dL (ref 6.5–8.3)
SODIUM: 140 mmol/L (ref 135–145)

## 2019-03-03 LAB — CBC W/ AUTO DIFF
BASOPHILS ABSOLUTE COUNT: 0.1 10*9/L (ref 0.0–0.1)
BASOPHILS RELATIVE PERCENT: 0.7 %
EOSINOPHILS ABSOLUTE COUNT: 0.4 10*9/L (ref 0.0–0.4)
HEMATOCRIT: 41.8 % (ref 41.0–53.0)
HEMOGLOBIN: 13.8 g/dL (ref 13.5–17.5)
LYMPHOCYTES ABSOLUTE COUNT: 2.3 10*9/L (ref 1.5–5.0)
LYMPHOCYTES RELATIVE PERCENT: 27.2 %
MEAN CORPUSCULAR HEMOGLOBIN CONC: 33 g/dL (ref 31.0–37.0)
MEAN CORPUSCULAR HEMOGLOBIN: 30.7 pg (ref 26.0–34.0)
MEAN CORPUSCULAR VOLUME: 92.9 fL (ref 80.0–100.0)
MONOCYTES ABSOLUTE COUNT: 0.6 10*9/L (ref 0.2–0.8)
MONOCYTES RELATIVE PERCENT: 6.6 %
NEUTROPHILS ABSOLUTE COUNT: 4.9 10*9/L (ref 2.0–7.5)
NEUTROPHILS RELATIVE PERCENT: 58.5 %
PLATELET COUNT: 184 10*9/L (ref 150–440)
RED BLOOD CELL COUNT: 4.5 10*12/L (ref 4.50–5.90)
RED CELL DISTRIBUTION WIDTH: 14.9 % (ref 12.0–15.0)
WBC ADJUSTED: 8.4 10*9/L (ref 4.5–11.0)

## 2019-03-03 LAB — TESTOSTERONE TOTAL: Testosterone:MCnc:Pt:Ser/Plas:Qn:: 30 — ABNORMAL LOW

## 2019-03-03 LAB — EGFR CKD-EPI NON-AA MALE: Lab: 57 — ABNORMAL LOW

## 2019-03-03 LAB — MEAN PLATELET VOLUME: Lab: 10.1 — ABNORMAL HIGH

## 2019-03-03 NOTE — Unmapped (Signed)
Paged provider no lab orders.

## 2019-03-06 NOTE — Unmapped (Signed)
MEDICAL ONCOLOGY FOLLOW UP VISIT    I spent 20 minutes on the audio/video with the patient. I spent an additional 25 minutes on pre- and post-visit activities.     The patient was physically located in [place] or a state in which I am permitted to provide care. The patient and/or parent/gauardian understood that s/he may incur co-pays and cost sharing, and agreed to the telemedicine visit. The visit was completed via phone and/or video, which was appropriate and reasonable under the circumstances given the patient's presentation at the time.    The patient and/or parent/guardian has been advised of the potential risks and limitations of this mode of treatment (including, but not limited to, the absence of in-person examination) and has agreed to be treated using telemedicine. The patient's/patient's family's questions regarding telemedicine have been answered.     If the phone/video visit was completed in an ambulatory setting, the patient and/or parent/guardian has also been advised to contact their provider???s office for worsening conditions, and seek emergency medical treatment and/or call 911 if the patient deems either necessary.      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  After adding enzalutamide, PSA dropped to <0.10 on 11/28/18 and 0.11 on 03/03/19.    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      Plan:   - ADT: He is receiving 56-month Lupron 22.5mg  (last on 03/03/19).  I tried to change to 38-month injection to reduce exposure to health system due to coronavirus but insurance refused this apparently.  - Continue enzalutamide.  - Follow PSA.    - Ritalin 10mg  for fatigue.  - Back pain/sciatica: L2-S1 decompression in 10/17 w Katheran Awe.  Off narcotic analgesics.  He is followed by Maryruth Eve in the Pain Clinic who is now managing his analgesics.  The patient feels his chronic back pain persists despite spine RT, and given this plus MRI findings, his pain is likely not related to prostate cancer. He previously saw Dr. Sabino Gasser of PM&R, and tried an epidural injection but the patient feels this did not help.  Has seen Charlett Blake for pain in this clinic too.    - Ankle pain: Persists since December so >3 months, xray was negative, and say orthopaedics but focus was on pain management. Will refer to PM&R for this.   - Nocturia improved with Ditropan: has met with w [facility] in past.  - Hot flashes - on Gabapentin 300 TID, Katina Degree PhD has counseled, pharmacy seeing today again. Stable  - Recommended Ca/Vit D.  - For ED, has seen Dr. Colon Branch in the past.  Viagra does not help.  He has spoken with Urology about options.    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  - BMD 6/17: The femoral neck density is mildly low, but the spine and total femoral densities are normal.    Return in 3 months in person for Lupron and evaluation and labs.     -------------------------------    Other Physicians:   Dr. Maryruth Eve [facility] Pain Medicine)  Dr. Carnella Gu[place]ara [facility] PM&R)  Dr. Nolon Rod (referring; Radiation Oncology)   Dr. Baxter Flattery (Urology)   Dr. Nuala Alpha (Anesthesia pain)   Dr. Hoy Morn (Urology, RE: ED)   Dr. 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     Current therapy: Lupron    Oncology history:   - Diagnosed 2008 with prostate cancer, PSA 13.5.   - Prostate biopsy on 04/02/07 w Gleason 3+4=7   - 2009 Radiation therapy to w Dr. Nonie Hoyer 78 Gy.   - 2012-13: Rising PSA   - 11/13: Saw [place] rad onc, felt not candidate for salvage RT   - 2/14: BS c/w 2009 with suspicious increased uptake at T5, L inferior iliac bone, subtle at L1.   - CT A/P shows sclerosis at L4-5 equivocal for DJD vs metastases, I discussed w radiology, felt most likely metastatic.   - 3/14: Lupron started   - 12/14: Lupron held for intermittent approach  - 11/13/13: Restarted Lupron due to PSA rise with worsening back pain  - 8/15: Palliative RT to spine in [place], [place].  - 05/25/14:  MRI spine: Multilevel degenerative changes, most prominent at L3-4 and L4-L5, where there is marked spinal canal stenosis and crowding with potential indentation of the nerve roots. Severe bilateral neural foraminal narrowing at L4-5 and moderate to marked neuroforaminal narrowing at L3-4 on the right. Additional multilevel degenerative changes as outlined above.    - 08/15/15: Abnormal focal marrow signal and enhancement at the endplates surrounding the L4-5 disc space are favored to represent acutely inflamed Schmorl's nodes. Metastatic disease is felt less likely but is not entirely excluded, particularly given enlargement of the lesion at the L5 level. Continued follow-up recommended.  - 6/17: Bone mineral density: The femoral neck density is mildly low, but the spine and total femoral densities are normal.  - 10/17: L2-S1 decompression surgery w Dr. 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  - 12/17: L ankle pain, negative xray.  - 04/20/18: CT A/P: Stable heterogeneity of the left iliac wing representing known metastatic disease. No new evidence of metastatic disease within the abdomen/pelvis.  - 04/25/18: Bone scan: Increased radiotracer activity within the left iliac bone concerning for osseous metastases.  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  Ongoing back pain w L sciatica.  Hot flashes persist on Lupron, daily.  Nocturia/chronic dysuria: much better with Ditropan 0-1x/night.  No pain.  Persistent ED.  Remainder of 10 system ROS otherwise negative.    Physical exam:   There were no vitals taken for this visit.  NAD  A+Ox3  No visible rash  No dyspnea or cough  No apparent neurological deficit    Current Outpatient Medications   Medication Sig Dispense Refill   ??? aspirin (ECOTRIN) 81 MG tablet Take 81 mg by mouth daily.     ??? azithromycin (ZITHROMAX) 250 MG tablet Take 500 mg by mouth daily. For 4 days     ??? benzonatate (TESSALON) 100 MG capsule Take 200 mg by mouth Three (3) times a day as needed.     ??? enzalutamide (XTANDI) 40 mg capsule Take 4 capsules (160 mg) by mouth daily. 120 capsule 6   ??? ezetimibe (ZETIA) 10 mg tablet Take 10 mg by mouth daily. Frequency:QD  Dosage:10   MG  Instructions:  Note:Dose: 10MG      ??? gabapentin (NEURONTIN) 600 MG tablet Take 600 mg by mouth Two (2) times a day.     ??? hydroCHLOROthiazide (HYDRODIURIL) 25 MG tablet Take 1 tablet (25 mg total) by mouth daily. 30 tablet 11   ??? lisinopril (PRINIVIL,ZESTRIL) 40 MG tablet Take 40 mg by mouth daily.      ??? methylphenidate HCl (RITALIN) 10 MG tablet Take 1 tablet (10 mg total) by mouth daily. 30 tablet 0   ??? mirtazapine (REMERON) 7.5 MG tablet Take 1 tablet (7.5 mg total) by mouth nightly. 30 tablet 1   ??? omeprazole (PRILOSEC) 20 MG capsule Take 20 mg by mouth daily.     ??? oxybutynin (DITROPAN) 5 MG tablet Take 1 tablet (5 mg total) by mouth Three (3) times a day.  0   ??? oxyCODONE (ROXICODONE) 5 MG immediate release tablet Take 1 tablet (5 mg total) by mouth every four (4) hours as needed for pain. for up to 10 doses 5 tablet 0   ??? pregabalin (LYRICA) 150 MG capsule Take 1 capsule (150 mg total) by mouth three (3) times a day (at 6am, noon and 6pm). 90 capsule 1   ??? tamsulosin (FLOMAX) 0.4 mg capsule Take 1 capsule (0.4 mg total) by mouth daily. 30 capsule 11   ??? traMADol (ULTRAM) 50 mg tablet Take 50 mg by mouth every six (6) hours as needed for pain.     ??? triamcinolone (KENALOG) 0.1 % cream daily.     ??? VENTOLIN HFA 90 mcg/actuation inhaler Inhale 1 puff every four (4) hours as needed.        No current facility-administered medications for this visit.        No Known Allergies    Laboratory:   06/27/12: PSA=3 (on Casodex), testosterone >800     Lab Results   Component Value Date    PSA Diagnostic 0.6 05/14/2014    PSA Diagnostic 0.5 02/05/2014    PSA Diagnostic 6.9 (H) 11/06/2013    PSA Diagnostic 2.4 07/24/2013    PSA Diagnostic 0.3 03/20/2013    PSA Diagnostic 0.2 12/26/2012       Lab on 03/03/2019   Component Date Value Ref Range Status   ??? PSA 03/03/2019 0.11  0.00 - 4.00 ng/mL Final   ??? Testosterone 03/03/2019 30* 179 - 756 ng/dL Final   ??? Sodium 25/95/6387 140  135 - 145 mmol/L Final   ??? Potassium 03/03/2019 4.4  3.5 - 5.0 mmol/L Final   ??? Chloride 03/03/2019 101  98 - 107 mmol/L Final   ??? Anion Gap 03/03/2019 13  7 - 15 mmol/L Final   ??? CO2 03/03/2019 26.0  22.0 - 30.0 mmol/L Final   ??? BUN 03/03/2019 29* 7 - 21 mg/dL Final   ??? Creatinine 03/03/2019 1.27  0.70 - 1.30 mg/dL Final   ??? BUN/Creatinine Ratio 03/03/2019 23   Final   ??? EGFR CKD-EPI Non-African American,* 03/03/2019 57* >=60 mL/min/1.58m2 Final   ??? EGFR CKD-EPI African American, Male 03/03/2019 66  >=60 mL/min/1.39m2 Final   ??? Glucose 03/03/2019 97  70 - 179 mg/dL Final   ??? Calcium 56/43/3295 9.6  8.5 - 10.2 mg/dL Final   ??? Albumin 18/84/1660 4.3  3.5 - 5.0 g/dL Final   ??? Total Protein 03/03/2019 7.5  6.5 - 8.3 g/dL Final   ??? Total Bilirubin 03/03/2019 0.5  0.0 - 1.2 mg/dL Final   ??? AST 63/04/6008 25  19 - 55 U/L Final   ??? ALT 03/03/2019 13  <50 U/L Final   ??? Alkaline Phosphatase 03/03/2019 115  38 - 126 U/L Final   ??? WBC 03/03/2019 8.4  4.5 - 11.0 10*9/L Final   ??? RBC 03/03/2019 4.50  4.50 - 5.90 10*12/L Final   ??? HGB 03/03/2019 13.8  13.5 - 17.5 g/dL Final   ??? HCT 16/01/9603 41.8  41.0 - 53.0 % Final   ??? MCV 03/03/2019 92.9  80.0 - 100.0 fL Final   ??? MCH 03/03/2019 30.7  26.0 - 34.0 pg Final   ??? MCHC 03/03/2019 33.0  31.0 - 37.0 g/dL Final   ??? RDW 54/12/8117 14.9  12.0 - 15.0 % Final   ??? MPV 03/03/2019 10.1* 7.0 - 10.0 fL Final   ??? Platelet 03/03/2019 184  150 - 440 10*9/L Final   ??? Neutrophils % 03/03/2019 58.5  % Final   ??? Lymphocytes % 03/03/2019 27.2  % Final   ??? Monocytes % 03/03/2019 6.6  % Final   ??? Eosinophils % 03/03/2019 5.2  % Final   ??? Basophils % 03/03/2019 0.7  % Final   ??? Absolute Neutrophils 03/03/2019 4.9  2.0 - 7.5 10*9/L Final   ??? Absolute Lymphocytes 03/03/2019 2.3  1.5 - 5.0 10*9/L Final   ??? Absolute Monocytes 03/03/2019 0.6  0.2 - 0.8 10*9/L Final   ??? Absolute Eosinophils 03/03/2019 0.4  0.0 - 0.4 10*9/L Final   ??? Absolute Basophils 03/03/2019 0.1  0.0 - 0.1 10*9/L Final   ??? Large Unstained Cells 03/03/2019 2  0 - 4 % Final     Lab Results   Component Value Date    PSA 0.11 03/03/2019    PSA <0.10 11/28/2018    PSA 2.96 08/08/2018    PSA 1.79 04/25/2018    PSA 2.01 01/10/2018    PSA 1.20 10/11/2017     3/17: Thyroid function WNL.

## 2019-03-09 IMAGING — DX DG CHEST 2V
2 series · 2 of 2 positions shown · non-contrast
Comparison: 01/15/2017

CLINICAL DATA: Cough.

EXAM:
CHEST - 2 VIEW

[chest pa]
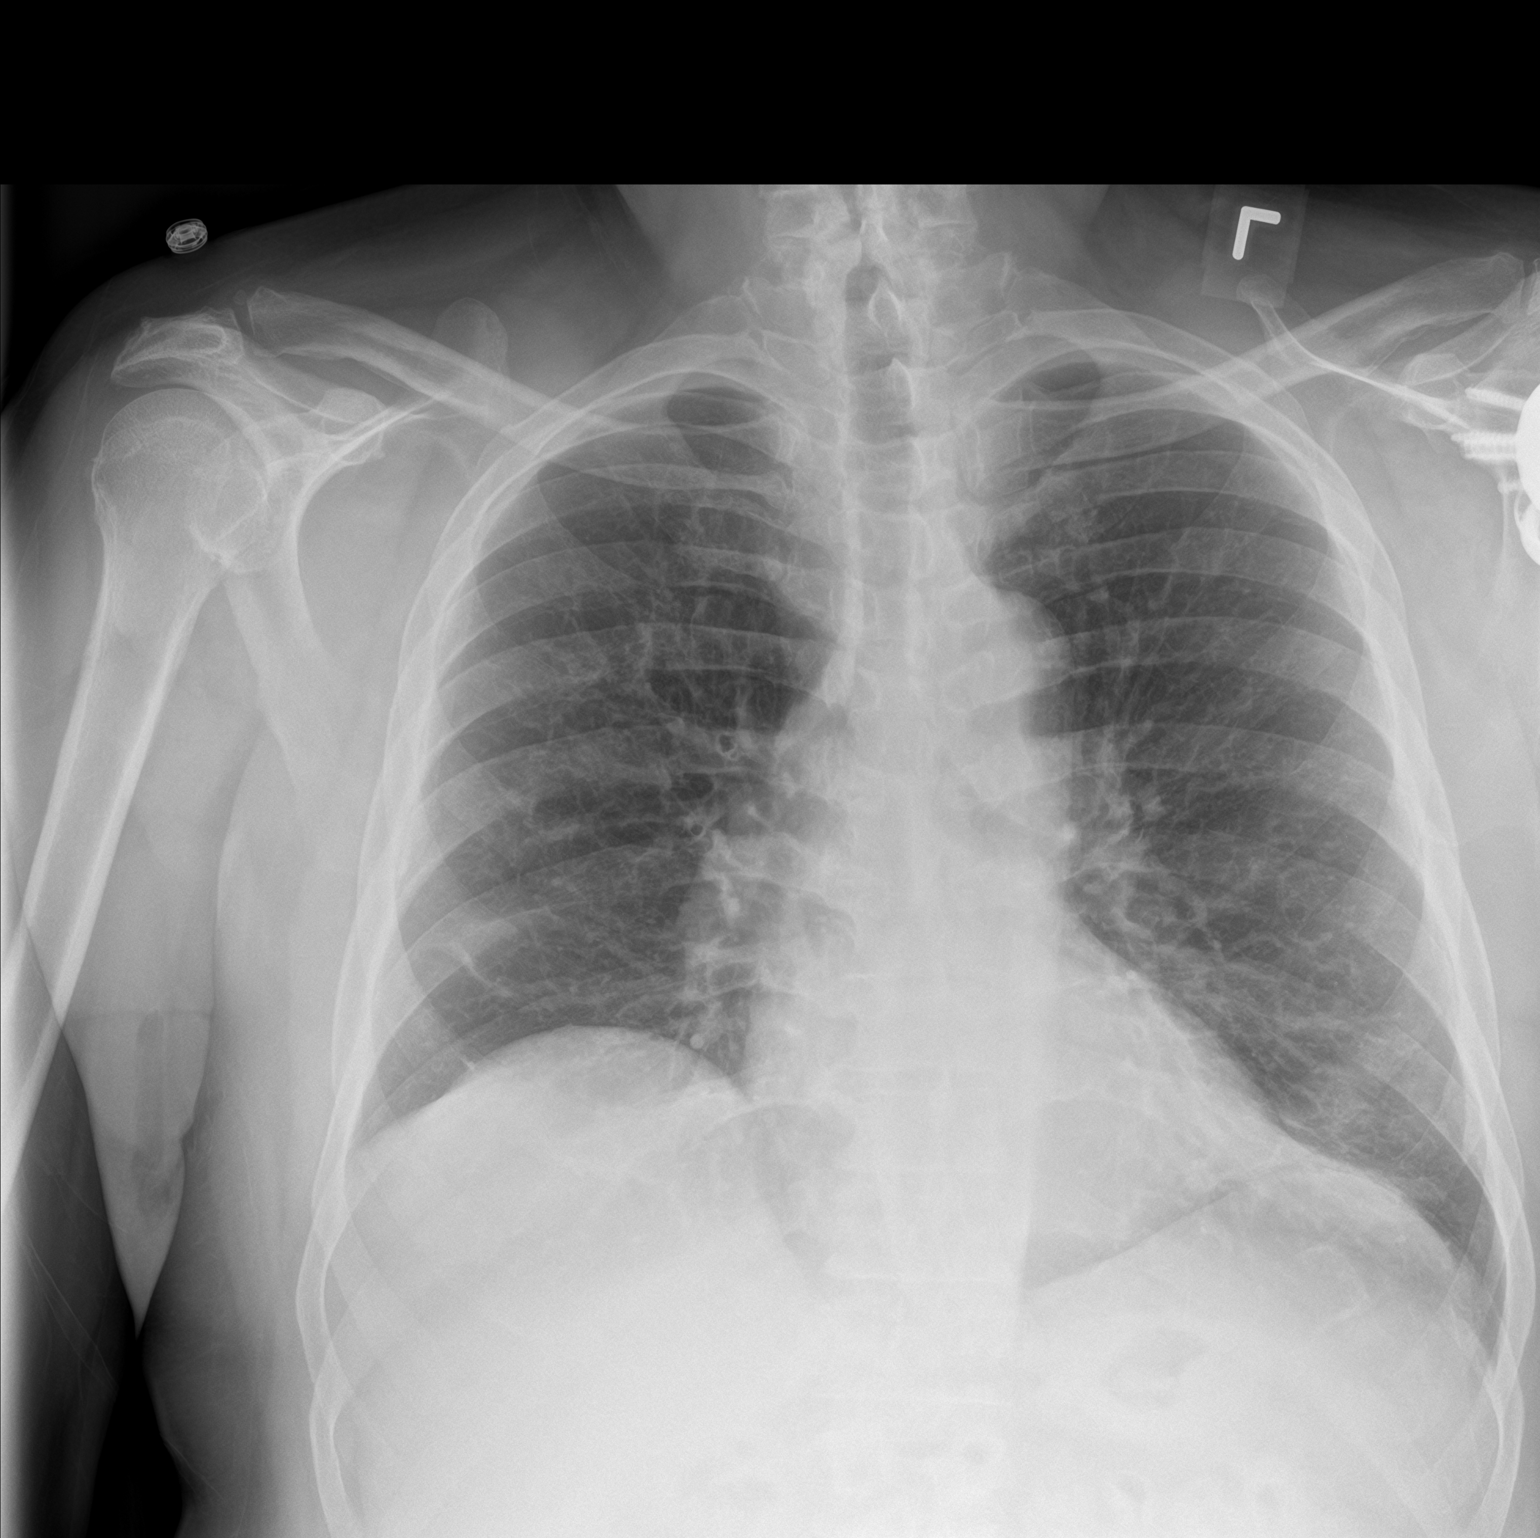

[chest lat]
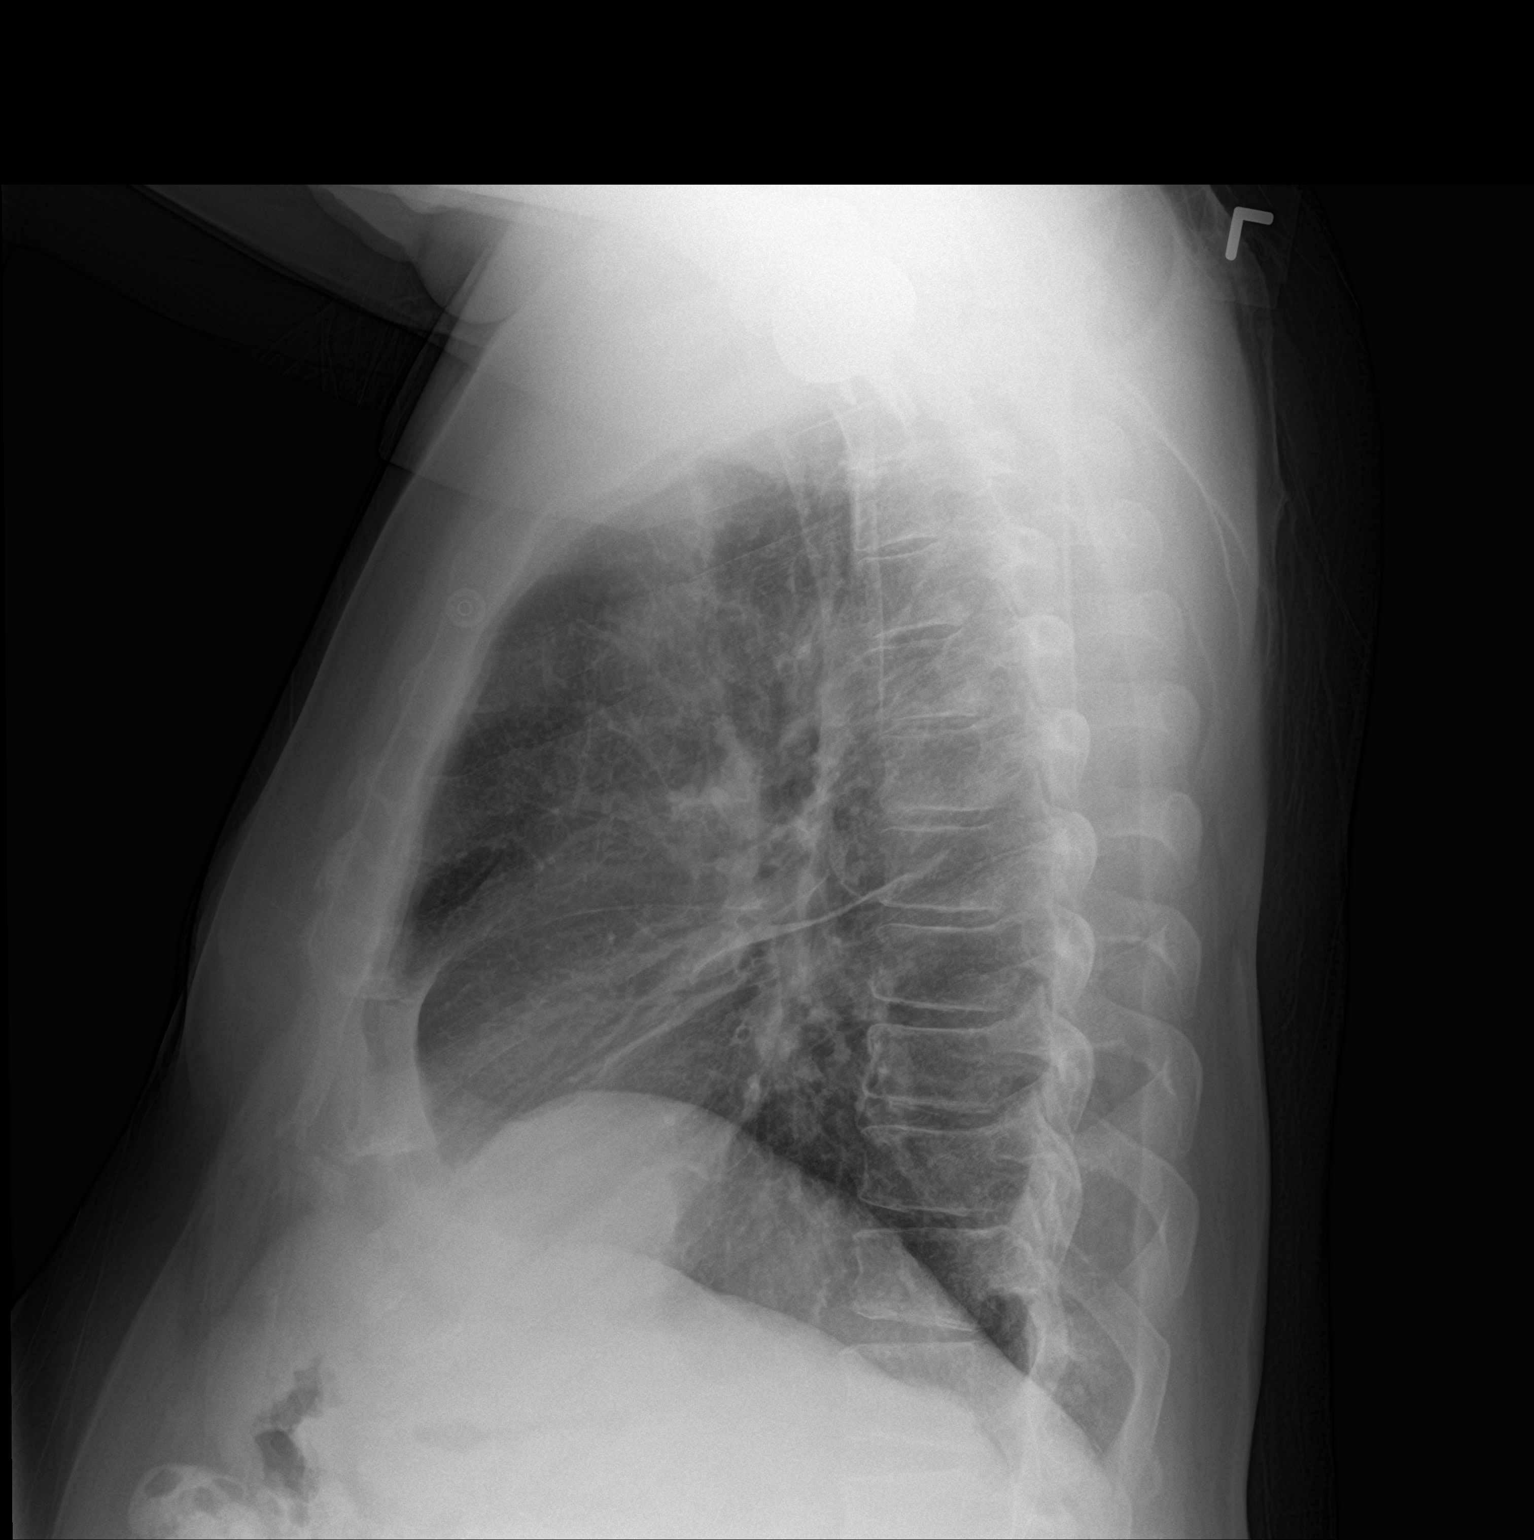

[2 of 2 positions shown; findings below may reference images not displayed]

FINDINGS: Heart size appears normal. No pleural effusion or edema identified.
Chronic interstitial scarring identified bilaterally. Chronic
interstitial scarring noted bilaterally. Left base opacity is noted
which may represent atelectasis or pneumonia.
IMPRESSION: 1. Left base atelectasis versus pneumonia.

## 2019-03-09 IMAGING — DX DG HAND COMPLETE 3+V*L*
3 series · 3 of 3 positions shown · non-contrast
Comparison: None.

CLINICAL DATA: Pain and swelling without trauma

EXAM:
LEFT HAND - COMPLETE 3+ VIEW

[hand pa]
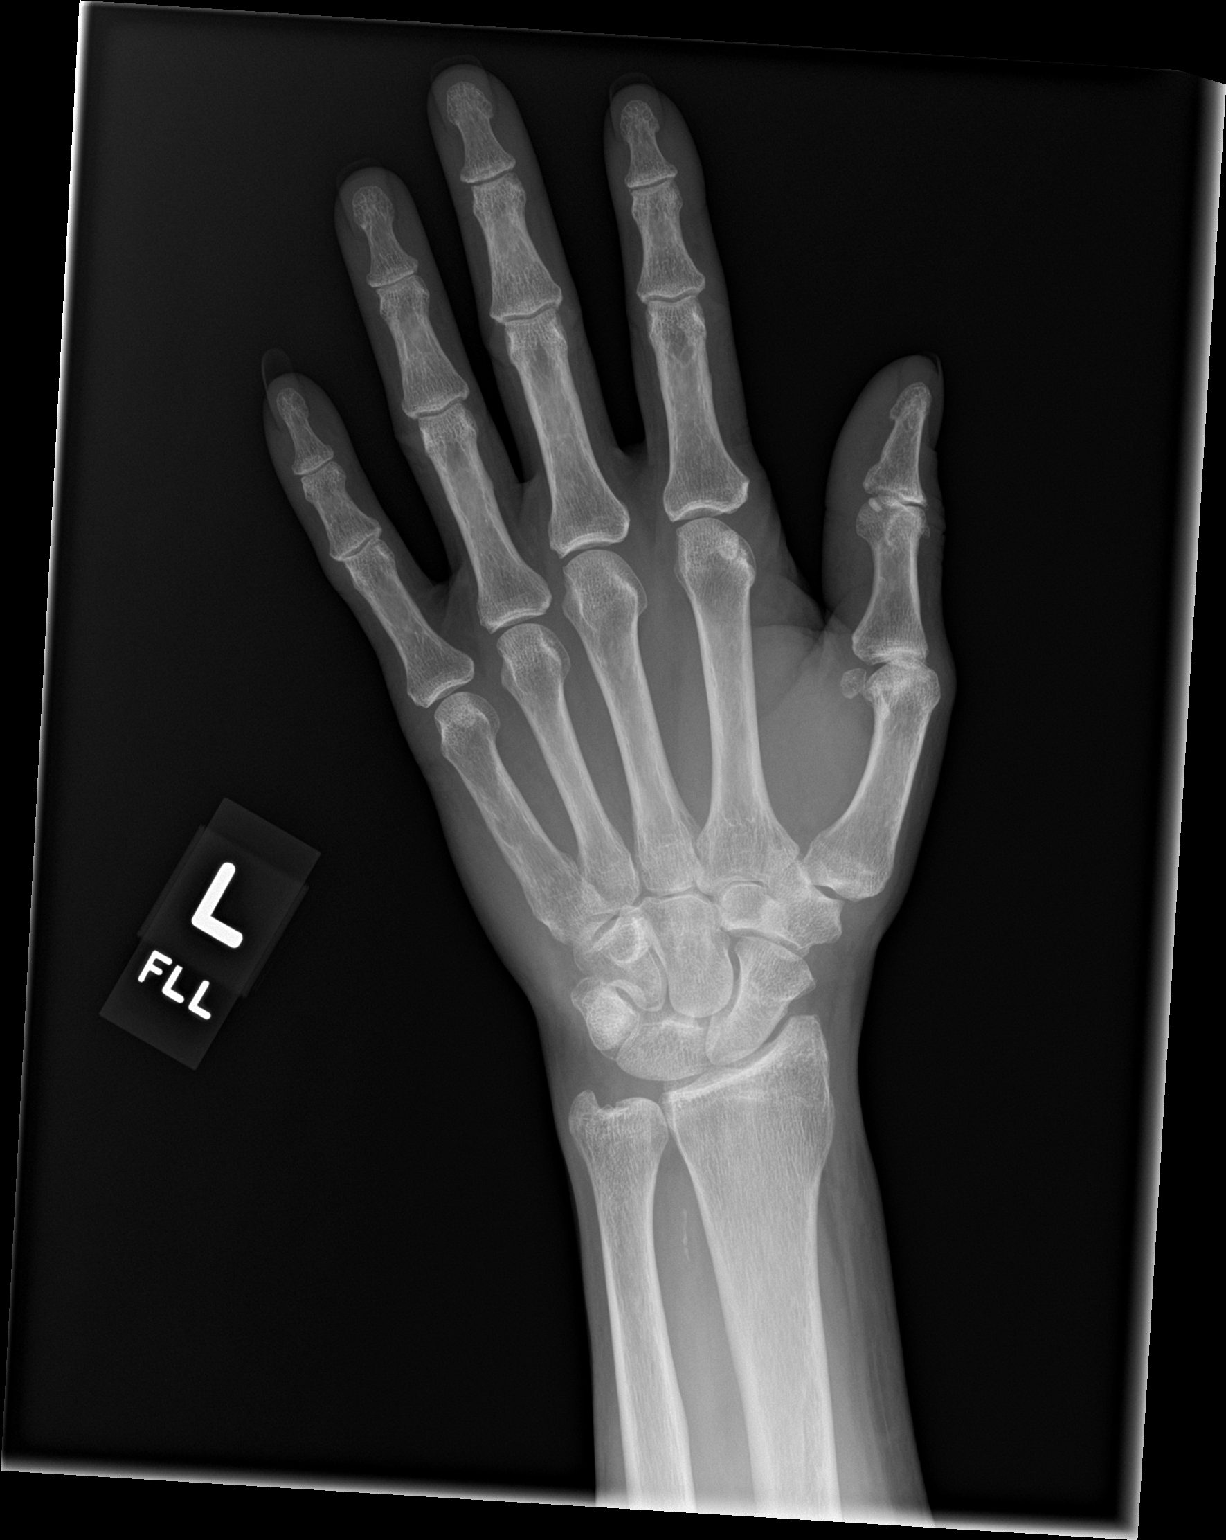

[hand obl]
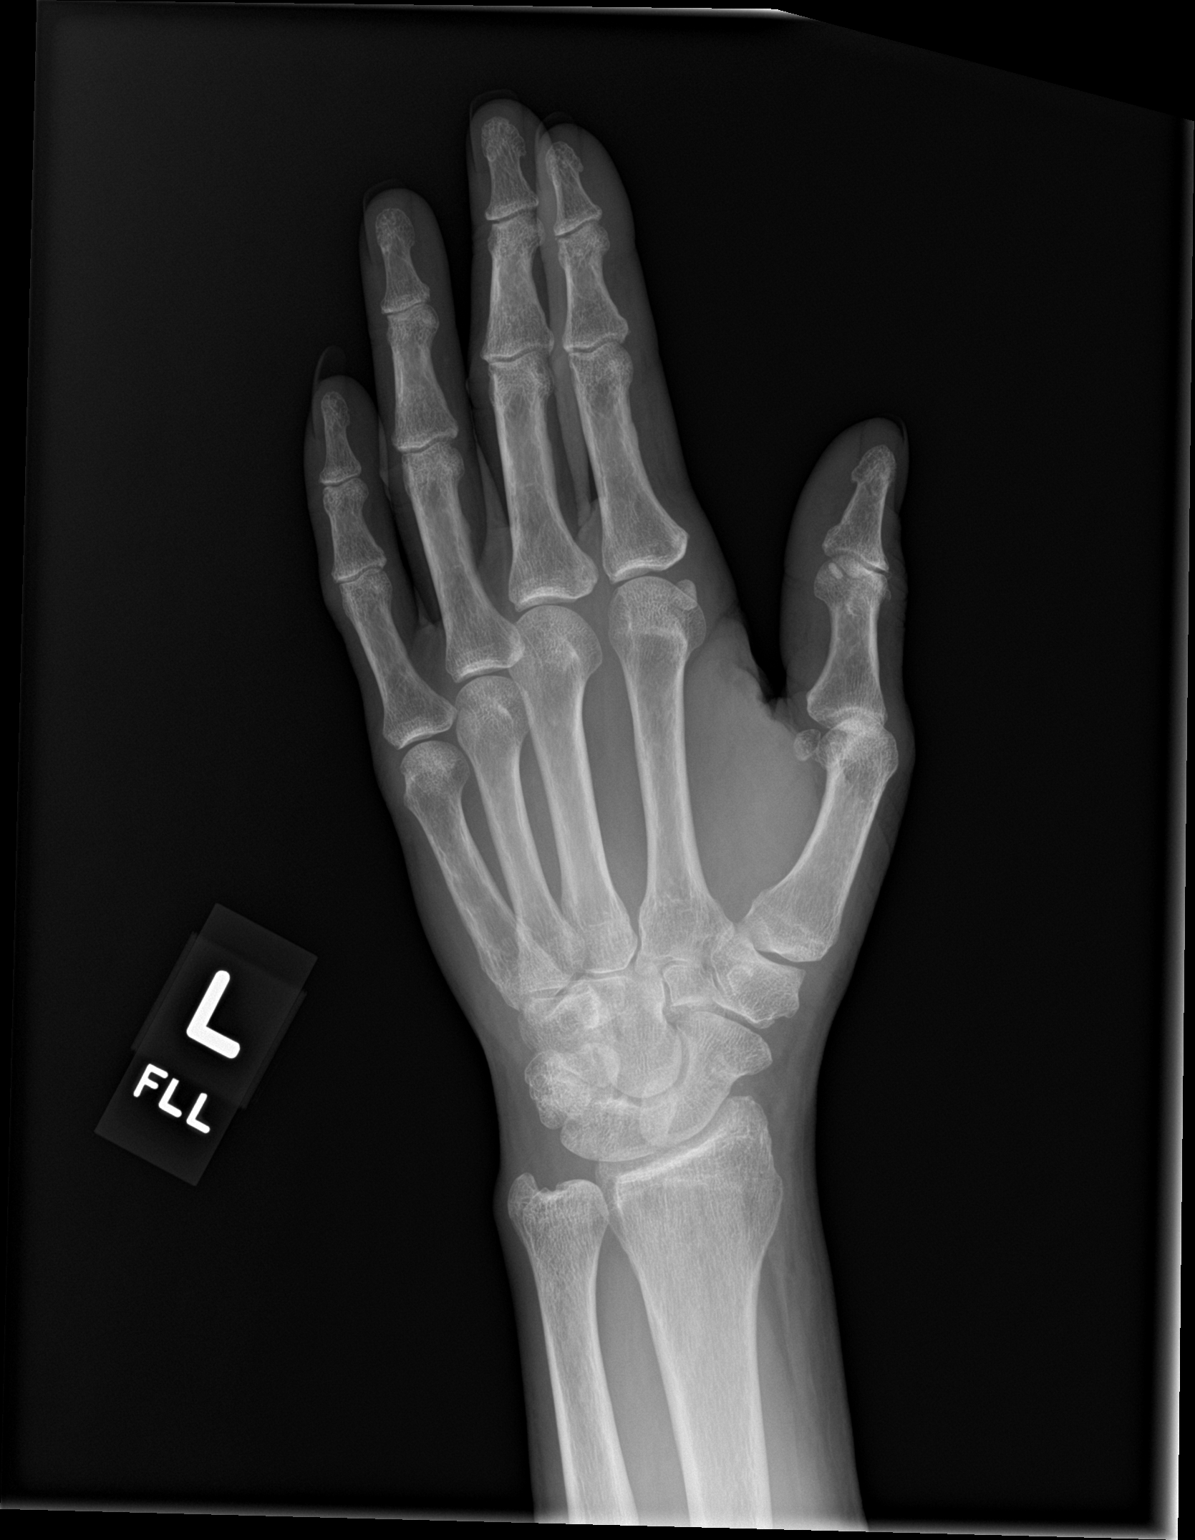

[hand lat]
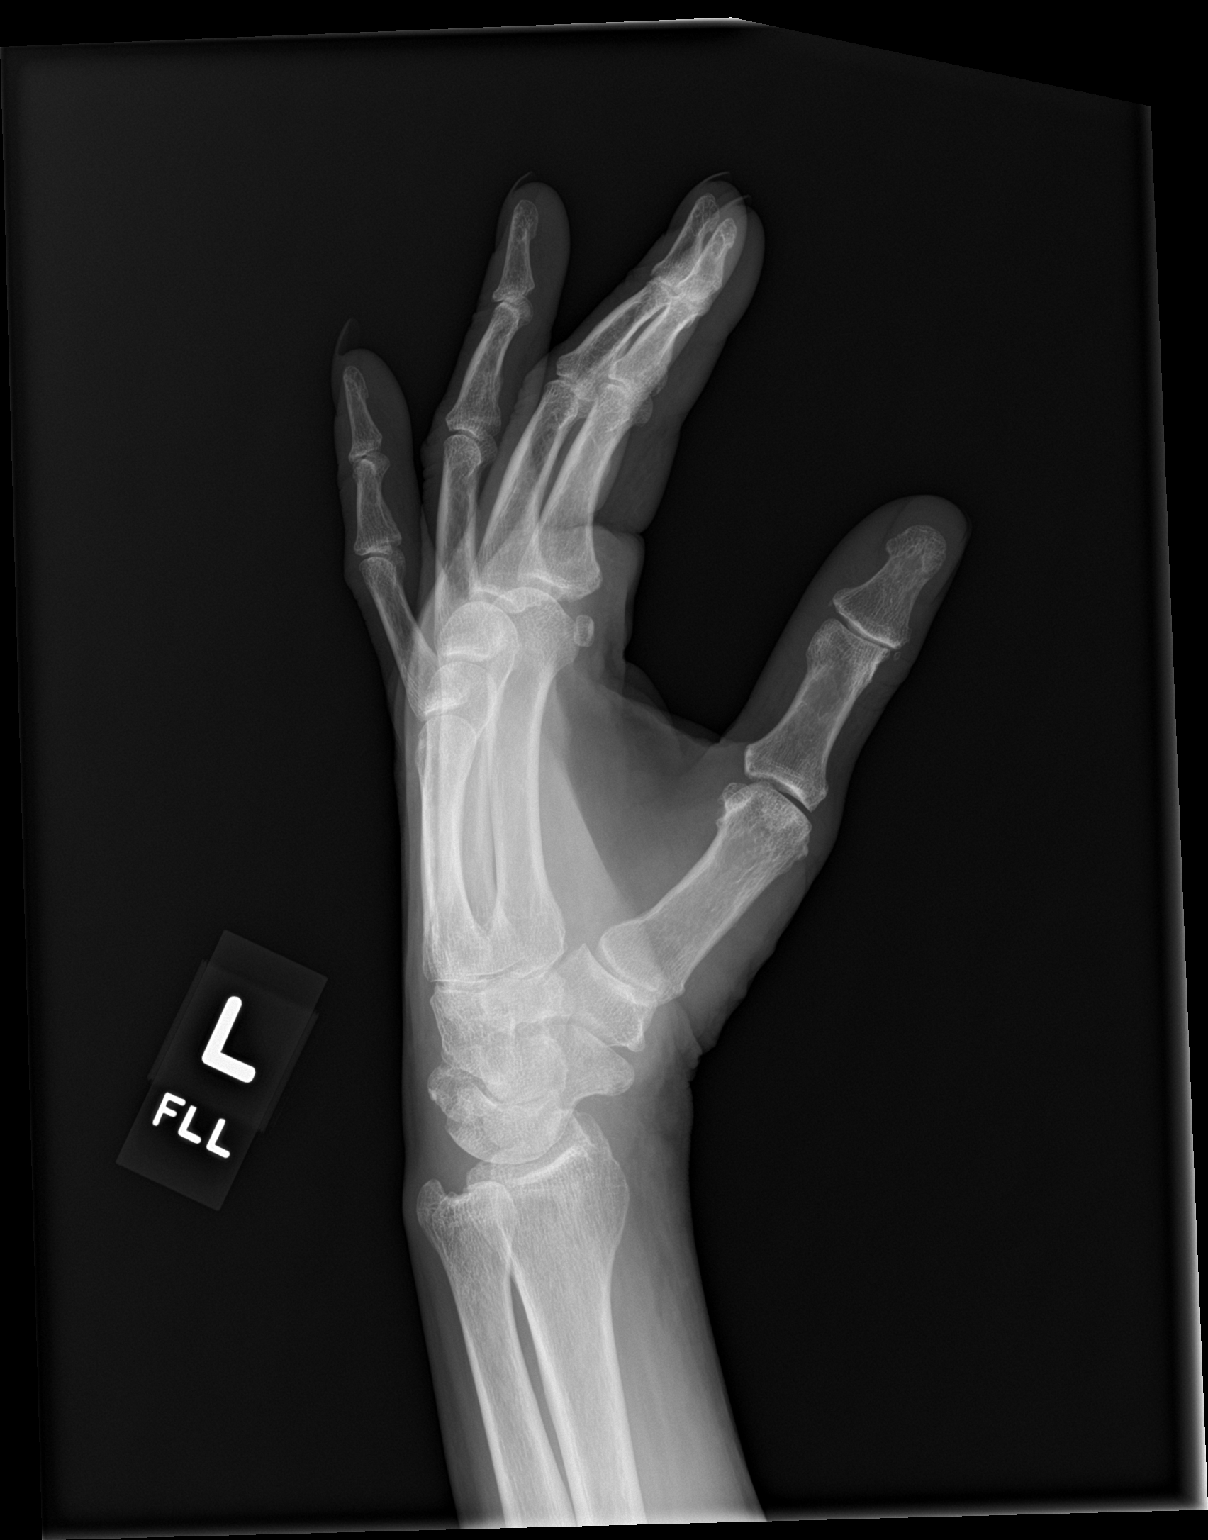

[3 of 3 positions shown; findings below may reference images not displayed]

FINDINGS: There is no evidence of fracture or dislocation. There is no
evidence of arthropathy or other focal bone abnormality. No
radiodense foreign body. Soft tissues are unremarkable.
IMPRESSION: Negative.

## 2019-03-12 ENCOUNTER — Ambulatory Visit: Payer: Self-pay

## 2019-03-12 ENCOUNTER — Ambulatory Visit (INDEPENDENT_AMBULATORY_CARE_PROVIDER_SITE_OTHER): Payer: Medicare Other | Admitting: Orthopedic Surgery

## 2019-03-12 ENCOUNTER — Other Ambulatory Visit: Payer: Self-pay

## 2019-03-12 ENCOUNTER — Encounter: Payer: Self-pay | Admitting: Orthopedic Surgery

## 2019-03-12 DIAGNOSIS — M25512 Pain in left shoulder: Secondary | ICD-10-CM | POA: Diagnosis not present

## 2019-03-13 ENCOUNTER — Institutional Professional Consult (permissible substitution): Admit: 2019-03-13 | Discharge: 2019-03-14 | Payer: MEDICARE | Attending: Medical Oncology | Primary: Medical Oncology

## 2019-03-13 DIAGNOSIS — C7951 Secondary malignant neoplasm of bone: Principal | ICD-10-CM

## 2019-03-13 DIAGNOSIS — C61 Malignant neoplasm of prostate: Principal | ICD-10-CM

## 2019-03-13 NOTE — Unmapped (Signed)
Virtual screening done. Provider on campus.

## 2019-03-13 NOTE — Unmapped (Signed)
Nice seeing you today.   If you have any questions please call the Nurse Navigator in the office at [phone], or [phone]) [phone].  Frederic Jericho, MD

## 2019-03-13 NOTE — Unmapped (Signed)
Patient Calvin Obrien was called in attempt number one to reach them regarding their upcoming appointments on 06/12/19.     The call resulted in: unable to leave message    Please inform the patient upon their return call of the following appointments schedule on 06/12/19:     1. Labs  2. Dr. Vernell Barrier  3. Injection    Thank you,    Estevan Oaks

## 2019-03-15 ENCOUNTER — Encounter: Payer: Self-pay | Admitting: Orthopedic Surgery

## 2019-03-15 NOTE — Progress Notes (Signed)
Office Visit Note   Patient: Willie Fowler           Date of Birth: Oct 21, 1950           MRN: [id number] Visit Date: 03/12/2019 Requested by: Abran Richard, MD [address] [place],  [place] [address] PCP: Abran Richard, MD  Subjective: No chief complaint on file.   HPI: Mihir is a patient with left shoulder pain.  Tries tramadol but does not help.  Hurts him to raise his left arm above his head.  He states that he was forced to put his arm behind his back by police when he was arrested.  He has had pain with certain movements since that time.  This event happened 1 month ago.  He is seeing a therapist.  He is tried a muscle relaxer also.  Describing primarily spasm in the biceps region.              ROS: All systems reviewed are negative as they relate to the chief complaint within the history of present illness.  Patient denies  fevers or chills.   Assessment & Plan: Visit Diagnoses:  1. Left shoulder pain, unspecified chronicity     Plan: Impression is left shoulder pain unclear etiology following an arrest where his arm was put behind his back I presume for handcuffing.  On examination today he is got pretty reasonable strength in the deltoid for abduction and forward flexion.  The biceps is palpable and functional.  I think we should wait this out for about 6 more weeks to see if it is improving.  Radiographs normal today.  No evidence of prosthetic dislodgment or loosening.  Follow-Up Instructions: Return in about 6 weeks (around 04/23/2019).   Orders:  Orders Placed This Encounter  Procedures  . XR Shoulder Left   No orders of the defined types were placed in this encounter.     Procedures: No procedures performed   Clinical Data: No additional findings.  Objective: Vital Signs: There were no vitals taken for this visit.  Physical Exam:   Constitutional: Patient appears well-developed HEENT:  Head: Normocephalic Eyes:EOM are normal Neck: Normal range  of motion Cardiovascular: Normal rate Pulmonary/chest: Effort normal Neurologic: Patient is alert Skin: Skin is warm Psychiatric: Patient has normal mood and affect    Ortho Exam: Ortho exam demonstrates forward flexion abduction both above 90 degrees.  No apprehension with stability evaluation.  Motor sensory function to the hand is intact.  Internal and external rotation strength feel about the same as prior visits.  Specialty Comments:  No specialty comments available.  Imaging: No results found.   PMFS History: Patient Active Problem List   Diagnosis Date Noted  . Anemia 05/14/2018  . Colon cancer ([place]) 05/14/2018    Class: History of  . Rotator cuff arthropathy, left   . Shoulder arthritis 12/18/2017  . Pneumonia 01/14/2017  . LLL pneumonia 01/14/2017  . Acute respiratory distress 01/14/2017  . Acute respiratory failure with hypoxia ([place]) 01/14/2017  . Chronic back pain 01/14/2017  . Opioid dependence ([place]) 01/14/2017  . High cholesterol 01/14/2017  . Current every day smoker 01/14/2017  . Leukocytosis 01/14/2017  . Constipation due to opioid therapy 01/14/2017  . Hypertension 01/14/2017  . Hyponatremia 01/14/2017  . Dehydration 01/14/2017   Past Medical History:  Diagnosis Date  . Arthritis   . Cancer [facility])    Prostate cancer  . Chronic ankle pain   . Chronic back pain   .  Chronic left shoulder pain   . GERD (gastroesophageal reflux disease)   . High cholesterol   . History of alcohol abuse    Reportedly went through withdrawals after his 2017 back surgery.  . Hypertension   . Lumbar radiculopathy   . Pain management    [facility]  . Pneumonia     Family History  Problem Relation Age of Onset  . Cancer Mother        breast  . Cancer Sister        breast  . Colon cancer Neg Hx     Past Surgical History:  Procedure Laterality Date  . BACK SURGERY    . COLONOSCOPY  2016   Dr. Posey Pronto: two 4-6 mm sigmoid colon polyps removed (adenomatous), mild left  sided diverticulosis  . COLONOSCOPY WITH PROPOFOL N/A 12/05/2018   Procedure: COLONOSCOPY WITH PROPOFOL;  Surgeon: Daneil Dolin, MD;  [facility];  Service: Endoscopy;  Laterality: N/A;  9:15am - pt knows to arrive at 8am for Covid test  . EAR CYST EXCISION Left 12/18/2017   Procedure: EXCISION CYST LEFT SHOULDER;  Surgeon: Meredith Pel, MD;  Location: [place];  Service: Orthopedics;  Laterality: Left;  . POLYPECTOMY  12/05/2018   Procedure: POLYPECTOMY;  Surgeon: Daneil Dolin, MD;  [facility];  Service: Endoscopy;;  colon  . REVERSE SHOULDER ARTHROPLASTY Left 12/18/2017   Procedure: LEFT REVERSE SHOULDER ARTHROPLASTY;  Surgeon: Meredith Pel, MD;  Location: [place];  Service: Orthopedics;  Laterality: Left;  . TONSILLECTOMY     Social History   Occupational History  . Not on file  Tobacco Use  . Smoking status: Current Every Day Smoker    Packs/day: 0.50    Years: 40.00    Pack years: 20.00    Types: Cigarettes  . Smokeless tobacco: Never Used  Substance and Sexual Activity  . Alcohol use: Yes    Comment: weekly-  BEER (12 ounces every few days)  . Drug use: No  . Sexual activity: Not on file

## 2019-03-17 DIAGNOSIS — C7951 Secondary malignant neoplasm of bone: Principal | ICD-10-CM

## 2019-03-17 DIAGNOSIS — C61 Malignant neoplasm of prostate: Principal | ICD-10-CM

## 2019-03-17 NOTE — Unmapped (Signed)
Patient Calvin Obrien was called in attempt number two to reach them regarding their upcoming appointments on 06/12/19.   ??  The call resulted in: unable to leave message  ??  Please inform the patient upon their return call of the following appointments schedule on 06/12/19:   ??  1. Labs  2. Dr. Vernell Barrier  3. Injection  ??  Thank you,  ??  Estevan Oaks

## 2019-03-17 NOTE — Unmapped (Signed)
[facility] Refill Coordination Note    Specialty Medication(s) to be Shipped:   Hematology/Oncology: Calvin Obrien    Other medication(s) to be shipped: N/A     Calvin Obrien, DOB: 08/08/1950  Phone: [phone] (home)       All above HIPAA information was verified with patient.     Was a Nurse, learning disability used for this call? No    Completed refill call assessment today to schedule patient's medication shipment from the [facility] [phone]).       Specialty medication(s) and dose(s) confirmed: Regimen is correct and unchanged.   Changes to medications: [place] reports no changes at this time.  Changes to insurance: No  Questions for the pharmacist: No    Confirmed patient received Welcome Packet with first shipment. The patient will receive a drug information handout for each medication shipped and additional FDA Medication Guides as required.       DISEASE/MEDICATION-SPECIFIC INFORMATION        N/A    SPECIALTY MEDICATION ADHERENCE     Medication Adherence    Patient reported X missed doses in the last month: 0  Specialty Medication: Xtandi 40mg   Patient is on additional specialty medications: No  Informant: patient                Xtandi 40 mg: 14 days of medicine on hand         SHIPPING     Shipping address confirmed in Epic.     Delivery Scheduled: Yes, Expected medication delivery date: 03/26/19.  However, Rx request for refills was sent to the provider as there are none remaining.     Medication will be delivered via UPS to the prescription address in Epic [place].    Calvin Obrien   [facility] Specialty Technician

## 2019-03-18 MED ORDER — XTANDI 40 MG CAPSULE
ORAL_CAPSULE | Freq: Every day | ORAL | 6 refills | 30.00000 days | Status: CP
Start: 2019-03-18 — End: ?
  Filled 2019-03-25: qty 120, 30d supply, fill #0

## 2019-03-25 MED FILL — XTANDI 40 MG CAPSULE: 30 days supply | Qty: 120 | Fill #0 | Status: AC

## 2019-04-28 NOTE — Unmapped (Signed)
[facility] Refill Coordination Note    Specialty Medication(s) to be Shipped:   Hematology/Oncology: Calvin Obrien    Other medication(s) to be shipped:       Calvin Obrien, DOB: [birth year]  Phone: [phone] (home)       All above HIPAA information was verified with patient.     Was a Nurse, learning disability used for this call? No    Completed refill call assessment today to schedule patient's medication shipment from the [facility] [phone]).       Specialty medication(s) and dose(s) confirmed: Regimen is correct and unchanged.   Changes to medications: Calvin Obrien reports no changes at this time.  Changes to insurance: No  Questions for the pharmacist: No    Confirmed patient received Welcome Packet with first shipment. The patient will receive a drug information handout for each medication shipped and additional FDA Medication Guides as required.       DISEASE/MEDICATION-SPECIFIC INFORMATION        N/A    SPECIALTY MEDICATION ADHERENCE     Medication Adherence    Patient reported X missed doses in the last month: 0  Specialty Medication: Xtandi 40mg   Patient is on additional specialty medications: No  Informant: patient                xtandi 40 mg: 7 days of medicine on hand         SHIPPING     Shipping address confirmed in Epic.     Delivery Scheduled: Yes, Expected medication delivery date: 01/27.     Medication will be delivered via UPS to the prescription address in Epic [facility].    Calvin Obrien   [facility] Specialty Technician

## 2019-04-29 MED FILL — XTANDI 40 MG CAPSULE: ORAL | 30 days supply | Qty: 120 | Fill #1

## 2019-04-29 MED FILL — XTANDI 40 MG CAPSULE: 30 days supply | Qty: 120 | Fill #1 | Status: AC

## 2019-05-02 NOTE — Unmapped (Signed)
[facility] Pharmacist Intervention    Type of intervention: Medication side effect    Medication: Xtandi    Problem: [name] called to report he has a dark ring around his left wrist (he wears a band made of rubber - has been wearing for a long time).  He tightened the band during his shower (like he always does), the next day he loosened the band and noticed a dark ring around his wrist and when he tried to rub the area it looked like skin was rubbing off.  He also reports feeling numb on his right leg between his ankle and knee.  He also, reports knuckles hurt when making a fist or trying to grab/pick up items.  Finally he also reports legs feeling itchy and he has been scratching a lot lately.  He has been on Xtandi since May 2020.  No issues until now.  He denies fever, redness, swelling around knuckles, legs, or arms. Denies any other issues.      Intervention: Counseled patient to not wear the band tightly around his wrist due to blocking blood flow and especially not for several  Hours at a time.  He was also counselded to try taking acetaminophen 500 mg every 4-6 hours to help with pain in knuckles.  Could be arthritic pain.  He reported not taking a calcium/Vit D. Supplement while on Xtandi- so he was counseled to add it to his daily medications/supplements.  He was advised to apply lotion liberally to legs and any other body part that felt itchy or scratchy.      Follow up needed: Routing msg to [facility] if any additional labs/monitoring is needed for leg numbness.    Approximate time spent: 10 minutes    [facility] Strauch [facility] Specialty Pharmacist

## 2019-05-06 NOTE — Unmapped (Signed)
I could not reach patient today to discuss the following:    If they return call to triage please read the following:    Patient eligible to receive COVID-19 vaccine per  DHHS guidelines. [place] resources for vaccine scheduling include: [email or web address] as well as scheduling phone number [phone].

## 2019-05-21 NOTE — Unmapped (Signed)
[place] [facility] Refill Coordination Note    Specialty Medication(s) to be Shipped:   Hematology/Oncology: Calvin Obrien    Other medication(s) to be shipped:       Calvin Obrien, DOB: Jan 16, 1951  Phone: [phone] (home)       All above HIPAA information was verified with patient.     Was a Nurse, learning disability used for this call? No    Completed refill call assessment today to schedule patient's medication shipment from the [facility] [phone]).       Specialty medication(s) and dose(s) confirmed: Regimen is correct and unchanged.   Changes to medications: Calvin Obrien reports no changes at this time.  Changes to insurance: No  Questions for the pharmacist: No    Confirmed patient received Welcome Packet with first shipment. The patient will receive a drug information handout for each medication shipped and additional FDA Medication Guides as required.       DISEASE/MEDICATION-SPECIFIC INFORMATION        N/A    SPECIALTY MEDICATION ADHERENCE     Medication Adherence    Patient reported X missed doses in the last month: 0  Specialty Medication: xtandi 40mg   Patient is on additional specialty medications: No  Informant: patient                Xtandi 40 mg: 10 days of medicine on hand         SHIPPING     Shipping address confirmed in Epic.     Delivery Scheduled: Yes, Expected medication delivery date: 02/24.     Medication will be delivered via UPS to the prescription address in Epic [place].    Calvin Obrien   [facility] Specialty Technician

## 2019-05-27 MED FILL — XTANDI 40 MG CAPSULE: ORAL | 30 days supply | Qty: 120 | Fill #2

## 2019-05-27 MED FILL — XTANDI 40 MG CAPSULE: 30 days supply | Qty: 120 | Fill #2 | Status: AC

## 2019-06-10 NOTE — Unmapped (Signed)
Pt contacted SW requesting verification of his appointment be sent to [facility] transportation. Pt states that he needs this done today.   SW received verbal permission to release appointment schedule to [facility]. SW faxed to [facility]     SW provided patient with SW contact information and will meet with patient on 06/12/2019 to determine CPAF eligibility for gas card assistance.

## 2019-06-10 NOTE — Unmapped (Signed)
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  After adding enzalutamide, PSA dropped to <0.10 on 11/28/18, 0.11 on 03/03/19, and <0.10 on 06/12/19.    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      Plan:   - ADT: He is receiving 51-month Lupron 22.5mg  (06/12/19).  (I tried to change to 44-month injection to reduce exposure to health system due to coronavirus but insurance refused this apparently.)  - Continue enzalutamide.  - Follow PSA.  - Urinary leakage: He will see Barkley Bruns, NP for evaluation today.    - Ritalin 10mg  for fatigue.  - Back pain/sciatica: L2-S1 decompression in 10/17 w Katheran Awe.  Off narcotic analgesics.  He is followed by Maryruth Eve in the Pain Clinic who is now managing his analgesics.  The patient feels his chronic back pain persists despite spine RT, and given this plus MRI findings, his pain is likely not related to prostate cancer. He previously saw Dr. Sabino Gasser of PM&R, and tried an epidural injection but the patient feels this did not help.  Has seen Charlett Blake for pain in this clinic too.    - Ankle pain: Persists since December so >3 months, xray was negative, and say orthopaedics but focus was on pain management. Will refer to PM&R for this.   - Nocturia improved with Ditropan: has met with w [facility] in past.  - Hot flashes - on Gabapentin 300 TID, Katina Degree PhD has counseled, pharmacy seeing today again. Stable  - Recommended Ca/Vit D.  - For ED, has seen Dr. Colon Branch in the past.  Viagra does not help.  He has spoken with Urology about options.    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  - BMD 6/17: The femoral neck density is mildly low, but the spine and total femoral densities are normal.    Return in 3 months in person for Lupron and evaluation and labs.     -------------------------------    Other Physicians:   Dr. Maryruth Eve [facility] Pain Medicine)  Dr. Carnella Guadalajara [facility] PM&R)  Dr. Nolon Rod (referring; Radiation Oncology)   Dr. Baxter Flattery (Urology)   Dr. Nuala Alpha (Anesthesia pain)   Dr. Hoy Morn (Urology, RE: ED)   Dr. 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     Current therapy: Lupron    Oncology history:   - Diagnosed 2008 with prostate cancer, PSA 13.5.   - Prostate biopsy on 04/02/07 w Gleason 3+4=7   - 2009 Radiation therapy to w Dr. Nonie Hoyer 78 Gy.   - 2012-13: Rising PSA   - 11/13: Saw [facility] rad onc, felt not candidate for salvage RT   - 2/14: BS c/w 2009 with suspicious increased uptake at T5, L inferior iliac bone, subtle at L1.   - CT A/P shows sclerosis at L4-5 equivocal for DJD vs metastases, I discussed w radiology, felt most likely metastatic.   - 3/14: Lupron started   - 12/14:  Lupron held for intermittent approach  - 11/13/13: Restarted Lupron due to PSA rise with worsening back pain  - 8/15: Palliative RT to spine in [place], [place].  - 05/25/14:  MRI spine: Multilevel degenerative changes, most prominent at L3-4 and L4-L5, where there is marked spinal canal stenosis and crowding with potential indentation of the nerve roots. Severe bilateral neural foraminal narrowing at L4-5 and moderate to marked neuroforaminal narrowing at L3-4 on the right. Additional multilevel degenerative changes as outlined above.    - 08/15/15: Abnormal focal marrow signal and enhancement at the endplates surrounding the L4-5 disc space are favored to represent acutely inflamed Schmorl's nodes. Metastatic disease is felt less likely but is not entirely excluded, particularly given enlargement of the lesion at the L5 level. Continued follow-up recommended.  - 6/17: Bone mineral density: The femoral neck density is mildly low, but the spine and total femoral densities are normal.  - 10/17: L2-S1 decompression surgery w Dr. 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  - 12/17: L ankle pain, negative xray.  - 04/20/18: CT A/P: Stable heterogeneity of the left iliac wing representing known metastatic disease. No new evidence of metastatic disease within the abdomen/pelvis.  - 04/25/18: Bone scan: Increased radiotracer activity within the left iliac bone concerning for osseous metastases.  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  Ongoing back pain w L sciatica.  Hot flashes persist on Lupron, daily.  Nocturia/chronic dysuria: much better with Ditropan 0-1x/night.  No pain.  Persistent ED.  Remainder of 10 system ROS otherwise negative.    Physical exam:   There were no vitals taken for this visit.  NAD  A+Ox3  No visible rash  No dyspnea or cough  No apparent neurological deficit    Current Outpatient Medications   Medication Sig Dispense Refill   ??? aspirin (ECOTRIN) 81 MG tablet Take 81 mg by mouth daily.     ??? azithromycin (ZITHROMAX) 250 MG tablet Take 500 mg by mouth daily. For 4 days     ??? benzonatate (TESSALON) 100 MG capsule Take 200 mg by mouth Three (3) times a day as needed.     ??? enzalutamide (XTANDI) 40 mg capsule Take 4 capsules (160 mg) by mouth daily. 120 capsule 6   ??? ezetimibe (ZETIA) 10 mg tablet Take 10 mg by mouth daily. Frequency:QD   Dosage:10   MG  Instructions:  Note:Dose: 10MG      ??? gabapentin (NEURONTIN) 600 MG tablet Take 600 mg by mouth Two (2) times a day.     ??? hydroCHLOROthiazide (HYDRODIURIL) 25 MG tablet Take 1 tablet (25 mg total) by mouth daily. 30 tablet 11   ??? lisinopril (PRINIVIL,ZESTRIL) 40 MG tablet Take 40 mg by mouth daily.      ??? methylphenidate HCl (RITALIN) 10 MG tablet Take 1 tablet (10 mg total) by mouth daily. 30 tablet 0   ??? mirtazapine (REMERON) 7.5 MG tablet Take 1 tablet (7.5 mg total) by mouth nightly. 30 tablet 1   ??? omeprazole (PRILOSEC) 20 MG capsule Take 20 mg by mouth daily.     ??? oxybutynin (DITROPAN) 5 MG tablet Take 1 tablet (5 mg total) by mouth Three (3) times a day.  0   ??? oxyCODONE (ROXICODONE) 5 MG immediate release tablet Take 1 tablet (5 mg total) by mouth every four (4) hours  as needed for pain. for up to 10 doses 5 tablet 0   ??? pregabalin (LYRICA) 150 MG capsule Take 1 capsule (150 mg total) by mouth three (3) times a day (at 6am, noon and 6pm). 90 capsule 1   ??? tamsulosin (FLOMAX) 0.4 mg capsule Take 1 capsule (0.4 mg total) by mouth daily. 30 capsule 11   ??? traMADol (ULTRAM) 50 mg tablet Take 50 mg by mouth every six (6) hours as needed for pain.     ??? triamcinolone (KENALOG) 0.1 % cream daily.     ??? VENTOLIN HFA 90 mcg/actuation inhaler Inhale 1 puff every four (4) hours as needed.        No current facility-administered medications for this visit.        No Known Allergies    Laboratory:   06/27/12: PSA=3 (on Casodex), testosterone >800     Lab Results   Component Value Date    PSA Diagnostic 0.6 05/14/2014    PSA Diagnostic 0.5 02/05/2014    PSA Diagnostic 6.9 (H) 11/06/2013    PSA Diagnostic 2.4 07/24/2013    PSA Diagnostic 0.3 03/20/2013    PSA Diagnostic 0.2 12/26/2012       No visits with results within 2 Week(s) from this visit.   Latest known visit with results is:   Lab on 03/03/2019   Component Date Value Ref Range Status   ??? PSA 03/03/2019 0.11  0.00 - 4.00 ng/mL Final   ??? Testosterone 03/03/2019 30* 179 - 756 ng/dL Final   ??? Sodium 04/54/0981 140  135 - 145 mmol/L Final   ??? Potassium 03/03/2019 4.4  3.5 - 5.0 mmol/L Final   ??? Chloride 03/03/2019 101  98 - 107 mmol/L Final   ??? Anion Gap 03/03/2019 13  7 - 15 mmol/L Final   ??? CO2 03/03/2019 26.0  22.0 - 30.0 mmol/L Final   ??? BUN 03/03/2019 29* 7 - 21 mg/dL Final   ??? Creatinine 03/03/2019 1.27  0.70 - 1.30 mg/dL Final   ??? BUN/Creatinine Ratio 03/03/2019 23   Final   ??? EGFR CKD-EPI Non-African American,* 03/03/2019 57* >=60 mL/min/1.32m2 Final   ??? EGFR CKD-EPI African American, Male 03/03/2019 66  >=60 mL/min/1.41m2 Final   ??? Glucose 03/03/2019 97  70 - 179 mg/dL Final   ??? Calcium 19/14/7829 9.6  8.5 - 10.2 mg/dL Final   ??? Albumin 56/21/3086 4.3  3.5 - 5.0 g/dL Final   ??? Total Protein 03/03/2019 7.5  6.5 - 8.3 g/dL Final   ??? Total Bilirubin 03/03/2019 0.5  0.0 - 1.2 mg/dL Final   ??? AST 57/84/6962 25  19 - 55 U/L Final   ??? ALT 03/03/2019 13  <50 U/L Final   ??? Alkaline Phosphatase 03/03/2019 115  38 - 126 U/L Final   ??? WBC 03/03/2019 8.4  4.5 - 11.0 10*9/L Final   ??? RBC 03/03/2019 4.50  4.50 - 5.90 10*12/L Final   ??? HGB 03/03/2019 13.8  13.5 - 17.5 g/dL Final   ??? HCT 95/28/4132 41.8  41.0 - 53.0 % Final   ??? MCV 03/03/2019 92.9  80.0 - 100.0 fL Final   ??? MCH 03/03/2019 30.7  26.0 - 34.0 pg Final   ??? MCHC 03/03/2019 33.0  31.0 - 37.0 g/dL Final   ??? RDW 44/04/270 14.9  12.0 - 15.0 % Final   ??? MPV 03/03/2019 10.1* 7.0 - 10.0 fL Final   ??? Platelet 03/03/2019 184  150 - 440 10*9/L Final   ???  Neutrophils % 03/03/2019 58.5  % Final   ??? Lymphocytes % 03/03/2019 27.2  % Final   ??? Monocytes % 03/03/2019 6.6  % Final   ??? Eosinophils % 03/03/2019 5.2  % Final   ??? Basophils % 03/03/2019 0.7  % Final   ??? Absolute Neutrophils 03/03/2019 4.9  2.0 - 7.5 10*9/L Final   ??? Absolute Lymphocytes 03/03/2019 2.3  1.5 - 5.0 10*9/L Final   ??? Absolute Monocytes 03/03/2019 0.6  0.2 - 0.8 10*9/L Final   ??? Absolute Eosinophils 03/03/2019 0.4  0.0 - 0.4 10*9/L Final   ??? Absolute Basophils 03/03/2019 0.1  0.0 - 0.1 10*9/L Final   ??? Large Unstained Cells 03/03/2019 2  0 - 4 % Final     Lab Results   Component Value Date    PSA 0.11 03/03/2019    PSA <0.10 11/28/2018    PSA 2.96 08/08/2018    PSA 1.79 04/25/2018    PSA 2.01 01/10/2018    PSA 1.20 10/11/2017     3/17: Thyroid function WNL.

## 2019-06-12 ENCOUNTER — Encounter: Admit: 2019-06-12 | Discharge: 2019-06-12 | Payer: MEDICARE | Attending: Medical Oncology | Primary: Medical Oncology

## 2019-06-12 ENCOUNTER — Ambulatory Visit: Admit: 2019-06-12 | Discharge: 2019-06-12 | Payer: MEDICARE

## 2019-06-12 ENCOUNTER — Encounter
Admit: 2019-06-12 | Discharge: 2019-06-12 | Payer: MEDICARE | Attending: Nurse Practitioner | Primary: Nurse Practitioner

## 2019-06-12 ENCOUNTER — Encounter: Admit: 2019-06-12 | Discharge: 2019-06-12 | Payer: MEDICARE

## 2019-06-12 DIAGNOSIS — C7951 Secondary malignant neoplasm of bone: Secondary | ICD-10-CM

## 2019-06-12 DIAGNOSIS — G8929 Other chronic pain: Secondary | ICD-10-CM

## 2019-06-12 DIAGNOSIS — M25579 Pain in unspecified ankle and joints of unspecified foot: Secondary | ICD-10-CM

## 2019-06-12 DIAGNOSIS — C61 Malignant neoplasm of prostate: Principal | ICD-10-CM

## 2019-06-12 DIAGNOSIS — R232 Flushing: Principal | ICD-10-CM

## 2019-06-12 DIAGNOSIS — M549 Dorsalgia, unspecified: Principal | ICD-10-CM

## 2019-06-12 DIAGNOSIS — R351 Nocturia: Principal | ICD-10-CM

## 2019-06-12 DIAGNOSIS — R3915 Urgency of urination: Principal | ICD-10-CM

## 2019-06-12 LAB — COMPREHENSIVE METABOLIC PANEL
ALBUMIN: 4.2 g/dL (ref 3.5–5.0)
ALKALINE PHOSPHATASE: 106 U/L (ref 38–126)
ALT (SGPT): 12 U/L (ref <50)
ANION GAP: 8 mmol/L (ref 7–15)
AST (SGOT): 25 U/L (ref 19–55)
BILIRUBIN TOTAL: 0.5 mg/dL (ref 0.0–1.2)
BLOOD UREA NITROGEN: 25 mg/dL — ABNORMAL HIGH (ref 7–21)
BUN / CREAT RATIO: 19
CALCIUM: 9.8 mg/dL (ref 8.5–10.2)
CHLORIDE: 104 mmol/L (ref 98–107)
CO2: 27 mmol/L (ref 22.0–30.0)
CREATININE: 1.34 mg/dL — ABNORMAL HIGH (ref 0.70–1.30)
EGFR CKD-EPI AA MALE: 62 mL/min/{1.73_m2} (ref >=60)
EGFR CKD-EPI NON-AA MALE: 53 mL/min/{1.73_m2} — ABNORMAL LOW (ref >=60)
POTASSIUM: 4.6 mmol/L (ref 3.5–5.0)
PROTEIN TOTAL: 7.2 g/dL (ref 6.5–8.3)
SODIUM: 139 mmol/L (ref 135–145)

## 2019-06-12 LAB — URINALYSIS WITH CULTURE REFLEX
BACTERIA: NONE SEEN /HPF
BILIRUBIN UA: NEGATIVE
BLOOD UA: NEGATIVE
GLUCOSE UA: NEGATIVE
HYALINE CASTS: 1 /LPF (ref 0–1)
LEUKOCYTE ESTERASE UA: NEGATIVE
PH UA: 5 (ref 5.0–9.0)
PROTEIN UA: NEGATIVE
RBC UA: <1 /HPF (ref <=3)
SPECIFIC GRAVITY UA: 1.023 (ref 1.003–1.030)
SQUAMOUS EPITHELIAL: 1 /HPF (ref 0–5)
UROBILINOGEN UA: 0.2
WBC UA: 1 /HPF (ref <=2)

## 2019-06-12 LAB — CREATININE: Creatinine:MCnc:Pt:Ser/Plas:Qn:: 1.34 — ABNORMAL HIGH

## 2019-06-12 LAB — PROSTATE SPECIFIC ANTIGEN: Prostate specific Ag:MCnc:Pt:Ser/Plas:Qn:: <0.1

## 2019-06-12 LAB — BLOOD UA: Hemoglobin:PrThr:Pt:Urine:Ord:Test strip: NEGATIVE

## 2019-06-12 LAB — TESTOSTERONE TOTAL: Testosterone:MCnc:Pt:Ser/Plas:Qn:: 32 — ABNORMAL LOW

## 2019-06-12 NOTE — Unmapped (Signed)
Calvin Obrien is seen in consultation at the request of Dr Vernell Barrier for evaluation of lower urianry tract symptoms          A&P:    1. Lower urinary tract symptoms: We discussed options for management. At this time, I am not certain what, if any, medications he is taking for urinary symptoms.    ?? We will call him later today or tomorrow to review his medication bottles with him. Specifically, if he is taking tamsulosin +/- an anticholenergic, dosage, frequency  ?? Bladder retraining: Timed voiding every 2 hours while awake. Hold the urge as long as he can  ?? Decrease nighttime fluid intake and stop all fluids at least one hour before bed  ?? Try Kegel exercises  ?? Send UA w/ culture reflex today  ?? Take note of any triggers and let me know  ?? Will decide on additional medical management once we know what he's taking    [name], NP-C  Adult Nurse Practitioner  Urology & Medical Oncology          HPI:    Calvin Obrien is a very nice 69 y.o. man who is being treated for advanced prostate cancer by Dr Vernell Barrier. Current treatment is ADT+ enzalutamide    He notes some bothersome urinary symptoms.  For about 6 months, he says he has had increased urgency with UUI. Says he just cant hold it when he gets the urge  Wears 2 pads/day; not soaked  He usually sleeps until about 0300, then gets up 3 times to void before he's up for the day  He is taking 3 pills before bed to help with the nocturia, not sure the name of it. He is not sure if he is taking tamsulosin  His med list includes tamsulosin and oxybutynin, both of which were written here in 2018.   He drinks 5-6 glasses of water every day, an occasional Sprite, and a can of beer a few days per week  He denies hematuria, dysuria, retention, or straining  Has a daily soft bowel movement    No Known Allergies    Prior to Admission medications    Medication Sig Start Date End Date Taking? Authorizing Provider   aspirin (ECOTRIN) 81 MG tablet Take 81 mg by mouth daily. Historical Provider, MD   azithromycin (ZITHROMAX) 250 MG tablet Take 500 mg by mouth daily. For 4 days 04/22/18   Historical Provider, MD   benzonatate (TESSALON) 100 MG capsule Take 200 mg by mouth Three (3) times a day as needed. 04/22/18   Historical Provider, MD   enzalutamide [name]) 40 mg capsule Take 4 capsules (160 mg) by mouth daily. 03/18/19   Chrisandra Netters, MD   ezetimibe (ZETIA) 10 mg tablet Take 10 mg by mouth daily. Frequency:QD   Dosage:10   MG  Instructions:  Note:Dose: 10MG  08/18/11   Historical Provider, MD   gabapentin (NEURONTIN) 600 MG tablet Take 600 mg by mouth Two (2) times a day. 03/20/18   Historical Provider, MD   hydroCHLOROthiazide (HYDRODIURIL) 25 MG tablet Take 1 tablet (25 mg total) by mouth daily. 08/22/18   Daylene Posey, CPP   lisinopril (PRINIVIL,ZESTRIL) 40 MG tablet Take 40 mg by mouth daily.  02/28/16   Historical Provider, MD   methylphenidate HCl (RITALIN) 10 MG tablet Take 1 tablet (10 mg total) by mouth daily. 08/08/18   Chrisandra Netters, MD   mirtazapine (REMERON) 7.5 MG tablet Take  1 tablet (7.5 mg total) by mouth nightly. 09/09/18   Daylene Posey, CPP   omeprazole (PRILOSEC) 20 MG capsule Take 20 mg by mouth daily.    Historical Provider, MD   oxybutynin (DITROPAN) 5 MG tablet Take 1 tablet (5 mg total) by mouth Three (3) times a day. 03/08/17   Valora Corporal, MD   oxyCODONE (ROXICODONE) 5 MG immediate release tablet Take 1 tablet (5 mg total) by mouth every four (4) hours as needed for pain. for up to 10 doses 05/24/17   Turner Daniels, MD   pregabalin (LYRICA) 150 MG capsule Take 1 capsule (150 mg total) by mouth three (3) times a day (at 6am, noon and 6pm). 07/20/16   Lorelee Market, FNP   tamsulosin (FLOMAX) 0.4 mg capsule Take 1 capsule (0.4 mg total) by mouth daily. 03/08/17   Valora Corporal, MD   traMADol (ULTRAM) 50 mg tablet Take 50 mg by mouth every six (6) hours as needed for pain.    Historical Provider, MD   triamcinolone (KENALOG) 0.1 % cream daily. 04/15/18   Historical Provider, MD   VENTOLIN HFA 90 mcg/actuation inhaler Inhale 1 puff every four (4) hours as needed.  03/16/15   Historical Provider, MD     Past Medical History:   Diagnosis Date   ??? Acid reflux    ??? Hyperlipemia    ??? Hypertension    ??? Prostate cancer (CMS-HCC)     bone metastasis       GENERAL: Pleasant, NAD.   VS: Reviewed and unremarkable  Head: normocephalic  Eyes: sclera aniecteric  Pulm: Non labored WOB  Skin: Warm, dry  Msk: No Le edema  NEURO: A&O x 3      Data:    PVR = 0 mL    UA: unremarkable

## 2019-06-12 NOTE — Unmapped (Signed)
Lupron given IM in ventro gluteal. Tolerated well by patient band aid applied.

## 2019-06-12 NOTE — Unmapped (Signed)
Bladder scan results: 0 mL 

## 2019-06-12 NOTE — Unmapped (Signed)
Nice seeing you today.   If you have any questions please call the Nurse Navigator in the office at [phone], or [phone]) [phone].  Frederic Jericho, MD

## 2019-06-12 NOTE — Unmapped (Signed)
Labs drawn peripherally by Yvette Cheek CST.

## 2019-06-12 NOTE — Unmapped (Addendum)
??   We will check in with you to confirm what meds you are taking  ?? Do timed voiding: Go pee every 2 hours during the day even if you don't think you need to  ?? Hold the urge to pee for as long as you can  ?? Stop drinking fluids at least 1 hour before bed  ?? Pee right before you lay down to go to bed      [name], NP-C, OCN  Adult Nurse Practitioner  Urology and Medical Oncology    If you have been prescribed a medication today, always read the package insert that comes with the medication.    In the event of an emergency, always call 911    Urology:  Main clinic & [facility]: [phone]  Fax: [phone]    Cancer Hospital:  Phone: [phone]  Fax: [phone]    After hours/nights/weekends:  Hospital Operator: [phone]    [email or web address]  [email or web address]

## 2019-06-13 NOTE — Unmapped (Signed)
Attempted to reach patient to find out exactly what home medications he is taking. Pt not home at this time. Will try again later.    Baxter Hire, RN

## 2019-06-16 NOTE — Unmapped (Signed)
Attempted to reach pt on both phone #'s regarding getting an updated medication list. No answer. Left VM.    Baxter Hire, RN

## 2019-06-17 NOTE — Unmapped (Addendum)
Here is his medication list:    Hydrochlorothiazide 25mg  daily  Tramadol 10mg  4 times daily  Tamsulosin 0.4mg  daily  Omeprazole 20mg  daily  Ezetimibe 10 mg daily  Lisinopril 40mg  daily  Pregabalin 150mg  TID (but only takes twice)  Gabapentin 600mg  TID  Oxybutynin 5mg  (once in morning & 3 times the dose @ night)      Let me know if I can help with anything else.    Tammra Pressman      ----- Message from Chancy Hurter, ANP sent at 06/13/2019 11:10 AM EST -----  Regarding: Help with meds  Calvin Obrien-    Mr Bowring is a patient of Ethan's who he asked me to see yesterday for urinary symptoms.  Unfortunately, he didn't have his meds with him and is not sure exactly what he is taking. Before I can prescribe anything new, I need to know what he's taking. Do you mind checking in with him to go over his meds? I told him to expect a call and to have all of his pill bottles.     Mainly curious if he's still taking tamsulosin and/or oxybutynin. They are on his list but the rxs were written in 2018    Thank you very much

## 2019-06-18 NOTE — Unmapped (Signed)
[facility] Refill Coordination Note    Specialty Medication(s) to be Shipped:   Hematology/Oncology: [name]    Other medication(s) to be shipped: n/a     [name], DOB: February 05, 1951  Phone: [phone] (home)       All above HIPAA information was verified with patient.     Was a Nurse, learning disability used for this call? No    Completed refill call assessment today to schedule patient's medication shipment from the [facility] [phone]).       Specialty medication(s) and dose(s) confirmed: Regimen is correct and unchanged.   Changes to medications: Skylan reports no changes at this time.  Changes to insurance: No  Questions for the pharmacist: No    Confirmed patient received Welcome Packet with first shipment. The patient will receive a drug information handout for each medication shipped and additional FDA Medication Guides as required.       DISEASE/MEDICATION-SPECIFIC INFORMATION        N/A    SPECIALTY MEDICATION ADHERENCE     Medication Adherence    Patient reported X missed doses in the last month: 0  Specialty Medication: Xtandi 40mg   Patient is on additional specialty medications: No  Informant: patient                Xtandi 40 mg: 7 days of medicine on hand          SHIPPING     Shipping address confirmed in Epic.     Delivery Scheduled: Yes, Expected medication delivery date: 06/20/19.     Medication will be delivered via UPS to the prescription address in Epic [place].    [name]   [facility] Specialty Technician

## 2019-06-19 MED FILL — XTANDI 40 MG CAPSULE: ORAL | 30 days supply | Qty: 120 | Fill #3

## 2019-06-19 MED FILL — XTANDI 40 MG CAPSULE: 30 days supply | Qty: 120 | Fill #3 | Status: AC

## 2019-06-30 MED ORDER — MIRABEGRON ER 25 MG TABLET,EXTENDED RELEASE 24 HR
ORAL_TABLET | Freq: Every day | ORAL | 6 refills | 30.00000 days | Status: CP
Start: 2019-06-30 — End: ?

## 2019-06-30 NOTE — Unmapped (Addendum)
Yes, he wants to try. [facility] in [place]. Will try PT if it doesn't work.    Earlisha Sharples        ----- Message from Chancy Hurter, ANP sent at 06/30/2019  1:13 PM EDT -----  Regarding: RE: Help with meds  Ellwood Handler-    So I think I'd like to do this    -continue tamsulosin  -Change the timing of the oxybutynin;he's taking the max dose of that and I'd prefer he do 10 mg qam and 10mg  qpm if he is hesistant to decrease the dose    -ADD mirabegron 25 mg daily. This will take at least 6 weeks to kick in.   -If this doesn't help, can refer for pelvic floor physical therapy (can do that now, too, if he wants)    -All the things I told him at our visit: timed voiding, urge suppression, avoiding constipation    Can you see if he'd like to try a new medication?  Thanks, m  ----- Message -----  From: Karie Georges, RN  Sent: 06/23/2019  10:15 AM EDT  To: Daylene Posey, CPP, #  Subject: RE: Help with meds                               Hello all:    I attempted to update, but it will only let me flag for review the ones that he doesn't take anymore. Katie--if you want to update that would be great.    Thanks!    Baxter Hire  ----- Message -----  From: Daylene Posey, CPP  Sent: 06/23/2019   9:35 AM EDT  To: Carlyon Shadow Dunn, ANP, #  Subject: RE: Help with meds                               Or Mirenda Baltazar we can do it together? I can manipulate the list to make sure nothing weird happens  ----- Message -----  From: Chrisandra Netters, MD  Sent: 06/20/2019  11:17 PM EDT  To: Daylene Posey, CPP, #  Subject: RE: Help with meds                               Hi Baxter Hire - if you have an updated med list, yes please change Epic to be accurate.  Are you able to do that?  Thanks, Enid Derry  ----- Message -----  From: Karie Georges, RN  Sent: 06/17/2019   3:33 PM EDT  To: Carlyon Shadow Dunn, ANP, #  Subject: RE: Help with meds                               FYI--it's quite different than his med list in Epic---I am not sure if I am allowed to discontinue from that list, so I'll leave it alone.    Baxter Hire  ----- Message -----  From: Chancy Hurter, ANP  Sent: 06/13/2019  11:10 AM EDT  To: Karie Georges, RN  Subject: Help with meds                                   Ellwood Handler-  Mr Pilley is a patient of Ethan's who he asked me to see yesterday for urinary symptoms.  Unfortunately, he didn't have his meds with him and is not sure exactly what he is taking. Before I can prescribe anything new, I need to know what he's taking. Do you mind checking in with him to go over his meds? I told him to expect a call and to have all of his pill bottles.     Mainly curious if he's still taking tamsulosin and/or oxybutynin. They are on his list but the rxs were written in 2018    Thank you very much

## 2019-07-21 NOTE — Unmapped (Signed)
[facility] Shared [facility] Clinical Assessment & Refill Coordination Note    Calvin Obrien, DOB: Aug 17, 1950  Phone: [phone] (home)     All above HIPAA information was verified with patient.     Was a Nurse, learning disability used for this call? No    Specialty Medication(s):   Hematology/Oncology: [name]     Current Outpatient Medications   Medication Sig Dispense Refill   ??? enzalutamide (XTANDI) 40 mg capsule Take 4 capsules (160 mg) by mouth daily. 120 capsule 6   ??? ezetimibe (ZETIA) 10 mg tablet Take 10 mg by mouth daily. Frequency:QD   Dosage:10   MG  Instructions:  Note:Dose: 10MG      ??? gabapentin (NEURONTIN) 600 MG tablet Take 600 mg by mouth Two (2) times a day.     ??? hydroCHLOROthiazide (HYDRODIURIL) 25 MG tablet Take 1 tablet (25 mg total) by mouth daily. 30 tablet 11   ??? lisinopril (PRINIVIL,ZESTRIL) 40 MG tablet Take 40 mg by mouth daily.      ??? mirabegron (MYRBETRIQ) 25 mg Tb24 extended-release tablet Take 1 tablet (25 mg total) by mouth daily. 30 tablet 6   ??? omeprazole (PRILOSEC) 20 MG capsule Take 20 mg by mouth daily.     ??? oxybutynin (DITROPAN) 5 MG tablet Take by mouth. 5 mg qAM and 15 mg qPM  0   ??? pregabalin (LYRICA) 150 MG capsule Take 1 capsule (150 mg total) by mouth three (3) times a day (at 6am, noon and 6pm). 90 capsule 1   ??? tamsulosin (FLOMAX) 0.4 mg capsule Take 1 capsule (0.4 mg total) by mouth daily. 30 capsule 11   ??? traMADol (ULTRAM) 50 mg tablet Take 50 mg by mouth every six (6) hours as needed for pain.       No current facility-administered medications for this visit.        Changes to medications: [name] reports no changes at this time.    No Known Allergies    Changes to allergies: No    SPECIALTY MEDICATION ADHERENCE     Xtandi 40 mg: 11 days of medicine on hand     Medication Adherence    Patient reported X missed doses in the last month: 0  Specialty Medication: xtandi 40 mg  Patient is on additional specialty medications: No  Informant: patient  Confirmed plan for next specialty medication refill: delivery by pharmacy          Specialty medication(s) dose(s) confirmed: Regimen is correct and unchanged.     Are there any concerns with adherence? No    Adherence counseling provided? Not needed    CLINICAL MANAGEMENT AND INTERVENTION      Clinical Benefit Assessment:    Do you feel the medicine is effective or helping your condition? Yes    Clinical Benefit counseling provided? Not needed    Adverse Effects Assessment:    Are you experiencing any side effects? No    Are you experiencing difficulty administering your medicine? 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 For example, brushing your teeth or getting up in the morning. 0    Have you discussed this with your provider? Not needed    Therapy Appropriateness:    Is therapy appropriate? Yes, therapy is appropriate and should be continued    DISEASE/MEDICATION-SPECIFIC INFORMATION      N/A  PATIENT SPECIFIC NEEDS     - Does the patient have any physical, cognitive, or cultural barriers? No    - Is the patient high risk? Yes, patient is taking oral chemotherapy. Appropriateness of therapy as been assessed.     - Does the patient require a Care Management Plan? No     - Does the patient require physician intervention or other additional services (i.e. nutrition, smoking cessation, social work)? No      SHIPPING     Specialty Medication(s) to be Shipped:   Hematology/Oncology: [name]    Other medication(s) to be shipped: none     Changes to insurance: No    Delivery Scheduled: Yes, Expected medication delivery date: 07/29/19.     Medication will be delivered via UPS to the confirmed prescription address in [facility].    The patient will receive a drug information handout for each medication shipped and additional FDA Medication Guides as required.  Verified that patient has previously received a Conservation officer, historic buildings. All of the patient's questions and concerns have been addressed.    [name] Shared [facility] Specialty Pharmacist

## 2019-07-28 MED FILL — XTANDI 40 MG CAPSULE: 30 days supply | Qty: 120 | Fill #4 | Status: AC

## 2019-07-28 MED FILL — XTANDI 40 MG CAPSULE: ORAL | 30 days supply | Qty: 120 | Fill #4

## 2019-08-20 DIAGNOSIS — I10 Essential (primary) hypertension: Principal | ICD-10-CM

## 2019-08-20 MED ORDER — HYDROCHLOROTHIAZIDE 25 MG TABLET
ORAL_TABLET | 11 refills | 0 days
Start: 2019-08-20 — End: ?

## 2019-08-25 ENCOUNTER — Emergency Department (HOSPITAL_COMMUNITY)
Admission: EM | Admit: 2019-08-25 | Discharge: 2019-08-25 | Disposition: A | Discharge status: Home / Self Care | Payer: Medicare Other | Attending: Emergency Medicine | Admitting: Emergency Medicine

## 2019-08-25 ENCOUNTER — Other Ambulatory Visit: Payer: Self-pay

## 2019-08-25 ENCOUNTER — Encounter (HOSPITAL_COMMUNITY): Payer: Self-pay

## 2019-08-25 DIAGNOSIS — Z79899 Other long term (current) drug therapy: Secondary | ICD-10-CM | POA: Insufficient documentation

## 2019-08-25 DIAGNOSIS — H9202 Otalgia, left ear: Secondary | ICD-10-CM | POA: Diagnosis present

## 2019-08-25 DIAGNOSIS — F17210 Nicotine dependence, cigarettes, uncomplicated: Secondary | ICD-10-CM | POA: Diagnosis not present

## 2019-08-25 DIAGNOSIS — I10 Essential (primary) hypertension: Secondary | ICD-10-CM | POA: Insufficient documentation

## 2019-08-25 DIAGNOSIS — Z8546 Personal history of malignant neoplasm of prostate: Secondary | ICD-10-CM | POA: Insufficient documentation

## 2019-08-25 DIAGNOSIS — R21 Rash and other nonspecific skin eruption: Secondary | ICD-10-CM | POA: Diagnosis not present

## 2019-08-25 DIAGNOSIS — Z96612 Presence of left artificial shoulder joint: Secondary | ICD-10-CM | POA: Insufficient documentation

## 2019-08-25 MED ORDER — NEOMYCIN-POLYMYXIN-HC 1 % OT SOLN
2.0000 [drp] | Freq: Once | OTIC | Status: AC
  Administered 2019-08-25: 2 [drp] via OTIC
  Filled 2019-08-25: qty 10

## 2019-08-25 NOTE — ED Provider Notes (Signed)
[facility] EMERGENCY DEPARTMENT Provider Note   CSN: [id number] Arrival date & time: 08/25/19  [id number]     History No chief complaint on file.   Willie Fowler is a 69 y.o. male.  HPI Who presents for evaluation of recurrent left ear pain.  He also complains of poison ivy which was treated last year with a prednisone taper.  For the left ear pain he is trying "sweet oil," but it does not help.  He denies hearing loss, from the left ear.  He denies sinus symptoms.  He denies fever.  He also complains of a rash on his face which has been present for about a year since he had an episode of poison ivy.  There are no other known modifying factors.    Past Medical History:  Diagnosis Date  . Arthritis   . Cancer [facility])    Prostate cancer  . Chronic ankle pain   . Chronic back pain   . Chronic left shoulder pain   . GERD (gastroesophageal reflux disease)   . High cholesterol   . History of alcohol abuse    Reportedly went through withdrawals after his 2017 back surgery.  . Hypertension   . Lumbar radiculopathy   . Pain management    [facility]  . Pneumonia     Patient Active Problem List   Diagnosis Date Noted  . Anemia 05/14/2018  . Colon cancer ([place]) 05/14/2018    Class: History of  . Rotator cuff arthropathy, left   . Shoulder arthritis 12/18/2017  . Pneumonia 01/14/2017  . LLL pneumonia 01/14/2017  . Acute respiratory distress 01/14/2017  . Acute respiratory failure with hypoxia ([place]) 01/14/2017  . Chronic back pain 01/14/2017  . Opioid dependence ([place]) 01/14/2017  . High cholesterol 01/14/2017  . Current every day smoker 01/14/2017  . Leukocytosis 01/14/2017  . Constipation due to opioid therapy 01/14/2017  . Hypertension 01/14/2017  . Hyponatremia 01/14/2017  . Dehydration 01/14/2017    Past Surgical History:  Procedure Laterality Date  . BACK SURGERY    . COLONOSCOPY  2016   Dr. Posey Pronto: two 4-6 mm sigmoid colon polyps removed (adenomatous), mild left sided  diverticulosis  . COLONOSCOPY WITH PROPOFOL N/A 12/05/2018   Procedure: COLONOSCOPY WITH PROPOFOL;  Surgeon: Daneil Dolin, MD;  [facility];  Service: Endoscopy;  Laterality: N/A;  9:15am - pt knows to arrive at 8am for Covid test  . EAR CYST EXCISION Left 12/18/2017   Procedure: EXCISION CYST LEFT SHOULDER;  Surgeon: Meredith Pel, MD;  Location: [place];  Service: Orthopedics;  Laterality: Left;  . POLYPECTOMY  12/05/2018   Procedure: POLYPECTOMY;  Surgeon: Daneil Dolin, MD;  Location[facility];  Service: Endoscopy;;  colon  . REVERSE SHOULDER ARTHROPLASTY Left 12/18/2017   Procedure: LEFT REVERSE SHOULDER ARTHROPLASTY;  Surgeon: Meredith Pel, MD;  Location: [place];  Service: Orthopedics;  Laterality: Left;  . TONSILLECTOMY         Family History  Problem Relation Age of Onset  . Cancer Mother        breast  . Cancer Sister        breast  . Colon cancer Neg Hx     Social History   Tobacco Use  . Smoking status: Current Every Day Smoker    Packs/day: 0.50    Years: 40.00    Pack years: 20.00    Types: Cigarettes  . Smokeless tobacco: Never Used  Substance Use Topics  .  Alcohol use: Yes    Comment: weekly-  BEER (12 ounces every few days)  . Drug use: No    Home Medications Prior to Admission medications   Medication Sig Start Date End Date Taking? Authorizing Provider  enzalutamide (XTANDI) 40 MG capsule Take 160 mg by mouth daily.    [provider]  ezetimibe (ZETIA) 10 MG tablet Take 10 mg by mouth daily.  08/18/11   [provider]  gabapentin (NEURONTIN) 600 MG tablet Take 600 mg by mouth 3 (three) times daily.  12/19/16   [provider]  hydrochlorothiazide (HYDRODIURIL) 25 MG tablet Take 25 mg by mouth daily.    [provider]  lisinopril (PRINIVIL,ZESTRIL) 40 MG tablet Take 40 mg by mouth daily. 12/25/14   [provider]  omeprazole (PRILOSEC) 20 MG capsule Take 20 mg by mouth daily.     [provider]  oxybutynin (DITROPAN) 5 MG tablet Take 5-15 mg by mouth See admin instructions. Taking 1 tablet (5mg ) in the morning and 3 tablets (15mg ) at bedtime.    [provider]  pregabalin (LYRICA) 150 MG capsule Take 150 mg by mouth 2 (two) times daily.     [provider]  tamsulosin (FLOMAX) 0.4 MG CAPS capsule Take 0.4 mg by mouth daily. 04/19/12   [provider]  traMADol (ULTRAM) 50 MG tablet Take 50 mg by mouth 4 (four) times daily.     [provider]  VENTOLIN HFA 108 (90 BASE) MCG/ACT inhaler Inhale 2 puffs into the lungs every 4 (four) hours as needed for wheezing.  03/16/15   [provider]    Allergies    Patient has no known allergies.  Review of Systems   Review of Systems  All other systems reviewed and are negative.   Physical Exam Updated Vital Signs There were no vitals taken for this visit.  Physical Exam Vitals and nursing note reviewed.  Constitutional:      General: He is in acute distress (He is uncomfortable).     Appearance: He is well-developed. He is not ill-appearing, toxic-appearing or diaphoretic.  HENT:     Head: Normocephalic and atraumatic.     Right Ear: Tympanic membrane, ear canal and external ear normal.     Left Ear: Tympanic membrane, ear canal and external ear normal.     Nose: Nose normal.     Mouth/Throat:     Mouth: Mucous membranes are moist.     Pharynx: No oropharyngeal exudate or posterior oropharyngeal erythema.  Eyes:     Conjunctiva/sclera: Conjunctivae normal.     Pupils: Pupils are equal, round, and reactive to light.  Neck:     Trachea: Phonation normal.  Cardiovascular:     Rate and Rhythm: Normal rate.  Pulmonary:     Effort: Pulmonary effort is normal.  Abdominal:     General: There is no distension.  Musculoskeletal:        General: Normal range of motion.     Cervical back: Normal range of motion and neck supple.     Comments: Normal gait  Skin:     General: Skin is warm and dry.     Comments: No visible rash of head or face  Neurological:     Mental Status: He is alert.     Cranial Nerves: No cranial nerve deficit.     Sensory: No sensory deficit.     Motor: No abnormal muscle tone.     Coordination: Coordination normal.  Psychiatric:        Mood and Affect: Mood normal.        Behavior: Behavior normal.     ED Results / Procedures / Treatments   Labs (all labs ordered are listed, but only abnormal results are displayed) Labs Reviewed - No data to display  EKG None  Radiology No results found.  Procedures Procedures (including critical care time)  Medications Ordered in ED Medications - No data to display  ED Course  I have reviewed the triage vital signs and the nursing notes.  Pertinent labs & imaging results that were available during my care of the patient were reviewed by me and considered in my medical decision making (see chart for details).    MDM Rules/Calculators/A&P                       Patient Vitals for the past 24 hrs:  BP Temp Temp src Pulse Resp SpO2 Height Weight  08/25/19 0745 140/82 98.1 F (36.7 C) Oral 71 16 96 % -- --  08/25/19 0739 -- 98.1 F (36.7 C) Oral -- 16 -- -- --  08/25/19 0738 -- -- -- -- -- -- 5\' 6"  (1.676 m) 89.8 kg    At discharge- reevaluation with update and discussion. After initial assessment and treatment, an updated evaluation reveals he is comfortable and has no further complaints, findings discussed and questions answered--. Daleen Bo   Medical Decision Making:  This patient is presenting for evaluation of left ear pain and rash on face, which does not require a range of treatment options, and is not a complaint that involves a high risk of morbidity and mortality. The differential diagnoses include external otitis, dermatitis, otitis media. I decided to review old records, and in summary healthy 69 year old male, who appears nontoxic, with subacute  discomfort..  I did not require additional historical information from anyone.    Critical Interventions-clinical evaluation, medication treatment, observation reassessment  After These Interventions, the Patient was reevaluated and was found fairly comfortable without change in symptoms.  Suspect nonspecific inflammation of the external ear without external otitis or otitis media.  Nonspecific facial rash, dermatitis, without clear etiology.  CRITICAL CARE-no Performed by: Daleen Bo  Nursing Notes Reviewed/ Care Coordinated Applicable Imaging Reviewed Interpretation of Laboratory Data incorporated into ED treatment  The patient appears reasonably screened and/or stabilized for discharge and I doubt any other medical condition or other [facility] requiring further screening, evaluation, or treatment in the ED at this time prior to discharge.  Plan: Home Medications-Cortisporin otic drops 2 4 times daily as needed to facial rash, continue routine medications; Home Treatments-rest, fluids; return here if the recommended treatment, does not improve the symptoms; Recommended follow up-PCP, as needed     Final Clinical Impression(s) / ED Diagnoses Final diagnoses:  None    Rx / DC Orders ED Discharge Orders    None       Daleen Bo, MD 08/26/19 [phone]

## 2019-08-25 NOTE — Discharge Instructions (Addendum)
For the ear pain we are giving you a bottle of Cortisporin otic, to use 2 drops 3 or 4 times a day to help the discomfort you are having in the ear.  For the rash on your face, use hydrocortisone cream, 2%, 3 or 4 times a day to help decrease the discomfort.

## 2019-08-25 NOTE — ED Triage Notes (Addendum)
PT c/o left ear pain since 3 o'clock this am. Denies drainage. Pt reports putting sweet oil in ear without relief.  PT also c/o facial rash that pt believes is poison ivy x1year. States "I was seen here last year and nothing was done"

## 2019-08-25 NOTE — Unmapped (Signed)
[facility] Shared [facility] Pharm[facility] Pharmacist Intervention    Type of intervention: Counseling    Medication: Xtandi    Problem: Patient reported itchy bumps on the inside of his thigh    Intervention: Patient was already at the ER for headache and poison ivy on face.  Told patient to have provider take a look at the bumps on his thigh.  Might be poison ivy related or possible bug bites.  He has been on Xtandi for several months and no issues.  Unlikey that bumps are due to [place]    Follow up needed: no    Approximate time spent: 5 minutes    Allice Garro A Shari Heritage Shared [facility] Specialty Pharmacist

## 2019-08-25 NOTE — Unmapped (Signed)
[facility] Pharmacy Refill Coordination Note    Specialty Medication(s) to be Shipped:   Hematology/Oncology: Calvin Obrien    Other medication(s) to be shipped: n/a     Calvin Obrien, DOB: 1951-02-03  Phone: [phone] (home)       All above HIPAA information was verified with patient.     Was a Nurse, learning disability used for this call? No    Completed refill call assessment today to schedule patient's medication shipment from the [facility] [phone]).       Specialty medication(s) and dose(s) confirmed: Regimen is correct and unchanged.   Changes to medications: Calvin Obrien reports no changes at this time.  Changes to insurance: No  Questions for the pharmacist: No    Confirmed patient received Welcome Packet with first shipment. The patient will receive a drug information handout for each medication shipped and additional FDA Medication Guides as required.       DISEASE/MEDICATION-SPECIFIC INFORMATION        N/A    SPECIALTY MEDICATION ADHERENCE     Medication Adherence    Patient reported X missed doses in the last month: 1  Specialty Medication: Xtandi 40mg   Patient is on additional specialty medications: No  Informant: patient                Xtandi 40 mg: 7 days of medicine on hand         SHIPPING     Shipping address confirmed in Epic.     Delivery Scheduled: Yes, Expected medication delivery date: 08/29/19.     Medication will be delivered via UPS to the prescription address in [employer] [place].    Calvin Obrien Calvin Obrien   [facility] Specialty Technician

## 2019-08-26 MED ORDER — HYDROCHLOROTHIAZIDE 25 MG TABLET
ORAL_TABLET | 11 refills | 0 days | Status: CP
Start: 2019-08-26 — End: ?

## 2019-08-28 MED FILL — XTANDI 40 MG CAPSULE: ORAL | 30 days supply | Qty: 120 | Fill #5

## 2019-08-28 MED FILL — XTANDI 40 MG CAPSULE: 30 days supply | Qty: 120 | Fill #5 | Status: AC

## 2019-09-17 NOTE — Unmapped (Signed)
[facility] Pharm[facility] Refill Coordination Note    Specialty Medication(s) to be Shipped:   Hematology/Oncology: Calvin Obrien    Other medication(s) to be shipped: n/a     Calvin Obrien, DOB: 1950/07/10  Phone: [phone] (home)       All above HIPAA information was verified with patient.     Was a Nurse, learning disability used for this call? No    Completed refill call assessment today to schedule patient's medication shipment from the [facility] [phone]).       Specialty medication(s) and dose(s) confirmed: Regimen is correct and unchanged.   Changes to medications: Izayah reports no changes at this time.  Changes to insurance: No  Questions for the pharmacist: No    Confirmed patient received Welcome Packet with first shipment. The patient will receive a drug information handout for each medication shipped and additional FDA Medication Guides as required.       DISEASE/MEDICATION-SPECIFIC INFORMATION        N/A    SPECIALTY MEDICATION ADHERENCE     Medication Adherence    Patient reported X missed doses in the last month: 0  Specialty Medication: Xtandi 40mg   Patient is on additional specialty medications: No  Informant: patient                Xtandi 40 mg: 11 days of medicine on hand         SHIPPING     Shipping address confirmed in Epic.     Delivery Scheduled: Yes, Expected medication delivery date: 09/26/19.     Medication will be delivered via UPS to the prescription address in [employer] [place].    Calvin Obrien M Calvin Obrien   [facility] Specialty Technician

## 2019-09-18 ENCOUNTER — Ambulatory Visit
Admit: 2019-09-18 | Discharge: 2019-09-18 | Payer: MEDICARE | Attending: Nurse Practitioner | Primary: Nurse Practitioner

## 2019-09-18 ENCOUNTER — Ambulatory Visit: Admit: 2019-09-18 | Discharge: 2019-09-18 | Payer: MEDICARE | Attending: Medical Oncology | Primary: Medical Oncology

## 2019-09-18 ENCOUNTER — Encounter: Admit: 2019-09-18 | Discharge: 2019-09-18 | Payer: MEDICARE

## 2019-09-18 DIAGNOSIS — C7951 Secondary malignant neoplasm of bone: Secondary | ICD-10-CM

## 2019-09-18 DIAGNOSIS — C61 Malignant neoplasm of prostate: Principal | ICD-10-CM

## 2019-09-18 DIAGNOSIS — M6289 Other specified disorders of muscle: Principal | ICD-10-CM

## 2019-09-18 DIAGNOSIS — R3915 Urgency of urination: Principal | ICD-10-CM

## 2019-09-18 DIAGNOSIS — M549 Dorsalgia, unspecified: Principal | ICD-10-CM

## 2019-09-18 LAB — EGFR CKD-EPI AA MALE
Glomerular filtration rate/1.73 sq M.predicted.black:ArVRat:Pt:Ser/Plas/Bld:Qn:Creatinine-based formula (CKD-EPI): 46 — ABNORMAL LOW

## 2019-09-18 LAB — COMPREHENSIVE METABOLIC PANEL
ALBUMIN: 4.3 g/dL (ref 3.5–5.0)
ALKALINE PHOSPHATASE: 111 U/L (ref 38–126)
ALT (SGPT): 13 U/L (ref <50)
ANION GAP: 8 mmol/L (ref 7–15)
BILIRUBIN TOTAL: 0.3 mg/dL (ref 0.0–1.2)
BLOOD UREA NITROGEN: 26 mg/dL — ABNORMAL HIGH (ref 7–21)
BUN / CREAT RATIO: 15
CALCIUM: 10 mg/dL (ref 8.5–10.2)
CHLORIDE: 101 mmol/L (ref 98–107)
CO2: 29 mmol/L (ref 22.0–30.0)
EGFR CKD-EPI AA MALE: 46 mL/min/{1.73_m2} — ABNORMAL LOW (ref >=60)
EGFR CKD-EPI NON-AA MALE: 40 mL/min/{1.73_m2} — ABNORMAL LOW (ref >=60)
GLUCOSE RANDOM: 112 mg/dL (ref 70–179)
POTASSIUM: 4.1 mmol/L (ref 3.5–5.0)
PROTEIN TOTAL: 7.3 g/dL (ref 6.5–8.3)
SODIUM: 138 mmol/L (ref 135–145)

## 2019-09-18 LAB — PROSTATE SPECIFIC ANTIGEN: Prostate specific Ag:MCnc:Pt:Ser/Plas:Qn:: <0.1

## 2019-09-18 LAB — TESTOSTERONE TOTAL: Testosterone:MCnc:Pt:Ser/Plas:Qn:: 38 — ABNORMAL LOW

## 2019-09-18 MED ORDER — MIRABEGRON ER 25 MG TABLET,EXTENDED RELEASE 24 HR
ORAL_TABLET | Freq: Every day | ORAL | 6 refills | 30 days | Status: CP
Start: 2019-09-18 — End: ?

## 2019-09-18 MED ADMIN — leuprolide (LUPRON) injection 22.5 mg: 22.5 mg | INTRAMUSCULAR | @ 15:00:00 | Stop: 2019-09-18

## 2019-09-18 NOTE — Unmapped (Signed)
Rfv:  lower urianry tract symptoms      A&P:    1. Lower urinary tract symptoms: We discussed options for management.      ?? Bladder retraining: Timed voiding every 2 hours while awake. Hold the urge as long as he can  ?? Decrease nighttime fluid intake and stop all fluids at least one hour before bed  ?? Referral to PFPT  ?? Take note of any triggers and let me know  ?? Continue tamsulosin, oxybutynin, and add mirabegron (new rx sent)  ?? Please bring pill bottles with him next visit    Follow up next time he is here to see Dr Vernell Barrier      [name], NP-C  Adult Nurse Practitioner  Urology & Medical Oncology          HPI:    Calvin Obrien is a very nice 69 y.o. man who is being treated for advanced prostate cancer by Dr Vernell Barrier. Current treatment is ADT+ enzalutamide    He notes some bothersome urinary symptoms.  For about 6 months, he says he has had increased urgency with UUI. Says he just cant hold it when he gets the urge  Wears 2 pads/day; not soaked  He usually sleeps until about 0300, then gets up 3 times to void before he's up for the day  He is taking 3 pills before bed to help with the nocturia, not sure the name of it. He is not sure if he is taking tamsulosin  His med list includes tamsulosin and oxybutynin, both of which were written here in 2018.   He drinks 5-6 glasses of water every day, an occasional Sprite, and a can of beer a few days per week  He denies hematuria, dysuria, retention, or straining  Has a daily soft bowel movement    Interval 09/18/2019    Sounds like he may have taken the mirabegron for a month or so but didn't fill the next month's rx. Hes not sure it helped much but reminded him can take over a month to see benefit    Mostly urgency and some UUI that bother him the most  He does not have his pill bottles today    Allergies   Allergen Reactions   ??? Poison [employer]- Rash per patient       Prior to Admission medications    Medication Sig Start Date End Date Taking? Authorizing Provider   aspirin (ECOTRIN) 81 MG tablet Take 81 mg by mouth daily.    Historical Provider, MD   azithromycin (ZITHROMAX) 250 MG tablet Take 500 mg by mouth daily. For 4 days 04/22/18   Historical Provider, MD   benzonatate (TESSALON) 100 MG capsule Take 200 mg by mouth Three (3) times a day as needed. 04/22/18   Historical Provider, MD   enzalutamide [name]) 40 mg capsule Take 4 capsules (160 mg) by mouth daily. 03/18/19   Chrisandra Netters, MD   ezetimibe (ZETIA) 10 mg tablet Take 10 mg by mouth daily. Frequency:QD   Dosage:10   MG  Instructions:  Note:Dose: 10MG  08/18/11   Historical Provider, MD   gabapentin (NEURONTIN) 600 MG tablet Take 600 mg by mouth Two (2) times a day. 03/20/18   Historical Provider, MD   hydroCHLOROthiazide (HYDRODIURIL) 25 MG tablet Take 1 tablet (25 mg total) by mouth daily. 08/22/18   Daylene Posey, CPP   lisinopril (PRINIVIL,ZESTRIL) 40 MG tablet Take  40 mg by mouth daily.  02/28/16   Historical Provider, MD   methylphenidate HCl (RITALIN) 10 MG tablet Take 1 tablet (10 mg total) by mouth daily. 08/08/18   Chrisandra Netters, MD   mirtazapine (REMERON) 7.5 MG tablet Take 1 tablet (7.5 mg total) by mouth nightly. 09/09/18   Daylene Posey, CPP   omeprazole (PRILOSEC) 20 MG capsule Take 20 mg by mouth daily.    Historical Provider, MD   oxybutynin (DITROPAN) 5 MG tablet Take 1 tablet (5 mg total) by mouth Three (3) times a day. 03/08/17   Valora Corporal, MD   oxyCODONE (ROXICODONE) 5 MG immediate release tablet Take 1 tablet (5 mg total) by mouth every four (4) hours as needed for pain. for up to 10 doses 05/24/17   Turner Daniels, MD   pregabalin (LYRICA) 150 MG capsule Take 1 capsule (150 mg total) by mouth three (3) times a day (at 6am, noon and 6pm). 07/20/16   Lorelee Market, FNP   tamsulosin (FLOMAX) 0.4 mg capsule Take 1 capsule (0.4 mg total) by mouth daily. 03/08/17   Valora Corporal, MD   traMADol (ULTRAM) 50 mg tablet Take 50 mg by mouth every six (6) hours as needed for pain.    Historical Provider, MD   triamcinolone (KENALOG) 0.1 % cream daily. 04/15/18   Historical Provider, MD   VENTOLIN HFA 90 mcg/actuation inhaler Inhale 1 puff every four (4) hours as needed.  03/16/15   Historical Provider, MD     Past Medical History:   Diagnosis Date   ??? Acid reflux    ??? Hyperlipemia    ??? Hypertension    ??? Prostate cancer (CMS-HCC)     bone metastasis       GENERAL: Pleasant, NAD.   VS: Reviewed and unremarkable  Head: normocephalic  Eyes: sclera aniecteric  Pulm: Non labored WOB  Skin: Warm, dry  Msk: No Le edema  NEURO: A&O x 3      Data:    PVR = 0 mL    UA: unremarkable

## 2019-09-18 NOTE — Unmapped (Signed)
Nice seeing you today.   If you have any questions please call the Nurse Navigator in the office at [phone], or [phone]) [phone].  Frederic Jericho, MD

## 2019-09-18 NOTE — Unmapped (Signed)
Labs drawn peripherally by jasmine E.

## 2019-09-18 NOTE — Unmapped (Addendum)
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  After adding enzalutamide, PSA dropped to <0.10 on 11/28/18, 0.11 on 03/03/19, and <0.10 on 06/12/19.    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      PSA is non-detectable today.    Plan:   - ADT: He is receiving 37-month Lupron 22.5mg  (09/18/19).  (I tried to change to 45-month injection to reduce exposure to health system due to coronavirus but insurance refused this apparently.)  - Continue enzalutamide.  - Follow PSA.  - I will refer him to palliative Care for his back pain today.  [facility] so [facility] and pain management here in the past pressing him on use of narcotics which I actually think has not been good for him.  He has legitimate pain and is taking only PRN ibuprofen now but has a lot of breakthrough pain.  I wonder if there are nonpharmacologic options.  So we will ask Palliative Care if they can help him.  - He will meet about gas money today to help him out.    - Urinary leakage: He has seen Barkley Bruns, NP for evaluation today.    - Ritalin 10mg  for fatigue.  - Back pain/sciatica: L2-S1 decompression in 10/17 w Katheran Awe.  Off narcotic analgesics.  He is followed by Maryruth Eve in the Pain Clinic who is now managing his analgesics.  The patient feels his chronic back pain persists despite spine RT, and given this plus MRI findings, his pain is likely not related to prostate cancer. He previously saw Dr. Sabino Gasser of PM&R, and tried an epidural injection but the patient feels this did not help.  Has seen Charlett Blake for pain in this clinic too.    - Ankle pain: Persists since December so >3 months, xray was negative, and say orthopaedics but focus was on pain management. Will refer to PM&R for this.   - Nocturia improved with Ditropan: has met with w [facility] in past.  - Hot flashes - on Gabapentin 300 TID, Katina Degree PhD has counseled, pharmacy seeing today again. Stable  - Recommended Ca/Vit D.  - For ED, has seen Dr. Colon Branch in the past.  Viagra does not help.  He has spoken with Urology about options.    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  - BMD 6/17: The femoral neck density is mildly low, but the spine and total femoral densities are normal.    Return in 3 months in person for Lupron and evaluation and labs.     -------------------------------    Other Physicians:   Dr. Maryruth Eve [facility])  Dr. Carnella Guadalajara [facility])  Dr. Nolon Rod (referring; Radiation Oncology)   Dr. Baxter Flattery (Urology)   Dr. Nuala Alpha (Anesthesia pain)   Dr. Hoy Morn (Urology, RE: ED)   Dr. 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     Current therapy:  Lupron    Oncology history:   - Diagnosed 2008 with prostate cancer, PSA 13.5.   - Prostate biopsy on 04/02/07 w Gleason 3+4=7   - 2009 Radiation therapy to w Dr. Nonie Hoyer 78 Gy.   - 2012-13: Rising PSA   - 11/13: Saw [place] rad onc, felt not candidate for salvage RT   - 2/14: BS c/w 2009 with suspicious increased uptake at T5, L inferior iliac bone, subtle at L1.   - CT A/P shows sclerosis at L4-5 equivocal for DJD vs metastases, I discussed w radiology, felt most likely metastatic.   - 3/14: Lupron started   - 12/14: Lupron held for intermittent approach  - 11/13/13: Restarted Lupron due to PSA rise with worsening back pain  - 8/15: Palliative RT to spine in [place], [place].  - 05/25/14:  MRI spine: Multilevel degenerative changes, most prominent at L3-4 and L4-L5, where there is marked spinal canal stenosis and crowding with potential indentation of the nerve roots. Severe bilateral neural foraminal narrowing at L4-5 and moderate to marked neuroforaminal narrowing at L3-4 on the right. Additional multilevel degenerative changes as outlined above.    - 08/15/15: Abnormal focal marrow signal and enhancement at the endplates surrounding the L4-5 disc space are favored to represent acutely inflamed Schmorl's nodes. Metastatic disease is felt less likely but is not entirely excluded, particularly given enlargement of the lesion at the L5 level. Continued follow-up recommended.  - 6/17: Bone mineral density: The femoral neck density is mildly low, but the spine and total femoral densities are normal.  - 10/17: L2-S1 decompression surgery w Dr. 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  - 12/17: L ankle pain, negative xray.  - 04/20/18: CT A/P: Stable heterogeneity of the left iliac wing representing known metastatic disease. No new evidence of metastatic disease within the abdomen/pelvis.  - 04/25/18: Bone scan: Increased radiotracer activity within the left iliac bone concerning for osseous metastases.  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  Ongoing back pain w L sciatica.  Hot flashes persist on Lupron, daily.  Nocturia/chronic dysuria: much better with Ditropan 0-1x/night.  No pain.  Persistent ED.  Remainder of 10 system ROS otherwise negative.    Physical exam:   BP 131/59  - Pulse 67  - Temp 36.7 ??C (98.1 ??F) (Oral)  - Resp 18  - Ht 167.6 cm (5' 5.98)  - Wt 89.8 kg (198 lb)  - SpO2 99%  - BMI 31.97 kg/m??   NAD  A+Ox3  No visible rash  No dyspnea or cough  No apparent neurological deficit    Current Outpatient Medications   Medication Sig Dispense Refill   ??? enzalutamide (XTANDI) 40 mg capsule Take 4 capsules (160 mg) by mouth daily. 120 capsule 6   ??? ezetimibe (ZETIA) 10 mg tablet Take 10 mg by mouth daily. Frequency:QD   Dosage:10   MG  Instructions:  Note:Dose: 10MG      ??? gabapentin (NEURONTIN) 600 MG tablet Take 600 mg by mouth Two (2) times a day.     ??? hydroCHLOROthiazide (HYDRODIURIL) 25 MG tablet TAKE 1 TABLET BY MOUTH ONCE DAILY. 30 tablet 11   ??? lisinopril (PRINIVIL,ZESTRIL) 40 MG tablet Take 40 mg by mouth daily.      ??? mirabegron (MYRBETRIQ) 25 mg Tb24 extended-release tablet Take 1 tablet (25 mg total)  by mouth daily. 30 tablet 6   ??? omeprazole (PRILOSEC) 20 MG capsule Take 20 mg by mouth daily.     ??? oxybutynin (DITROPAN) 5 MG tablet Take by mouth. 5 mg qAM and 15 mg qPM  0   ??? pregabalin (LYRICA) 150 MG capsule Take 1 capsule (150 mg total) by mouth three (3) times a day (at 6am, noon and 6pm). 90 capsule 1   ??? tamsulosin (FLOMAX) 0.4 mg capsule Take 1 capsule (0.4 mg total) by mouth daily. 30 capsule 11   ??? traMADol (ULTRAM) 50 mg tablet Take 50 mg by mouth every six (6) hours as needed for pain.       No current facility-administered medications for this visit.       Not on File    Laboratory:   06/27/12: PSA=3 (on Casodex), testosterone >800     Lab Results   Component Value Date    PSA Diagnostic 0.6 05/14/2014    PSA Diagnostic 0.5 02/05/2014    PSA Diagnostic 6.9 (H) 11/06/2013    PSA Diagnostic 2.4 07/24/2013    PSA Diagnostic 0.3 03/20/2013    PSA Diagnostic 0.2 12/26/2012       No visits with results within 2 Week(s) from this visit.   Latest known visit with results is:   Office Visit on 06/12/2019   Component Date Value Ref Range Status   ??? Color, UA 06/12/2019 Yellow   Final   ??? Clarity, UA 06/12/2019 Clear   Final   ??? Specific Gravity, UA 06/12/2019 1.023  1.003 - 1.030 Final   ??? pH, UA 06/12/2019 5.0  5.0 - 9.0 Final   ??? Leukocyte Esterase, UA 06/12/2019 Negative  Negative Final   ??? Nitrite, UA 06/12/2019 Negative  Negative Final   ??? Protein, UA 06/12/2019 Negative  Negative Final   ??? Glucose, UA 06/12/2019 Negative  Negative Final   ??? Ketones, UA 06/12/2019 Negative  Negative Final   ??? Urobilinogen, UA 06/12/2019 0.2 mg/dL  0.2 mg/dL, 1.0 mg/dL Final   ??? Bilirubin, UA 06/12/2019 Negative  Negative Final   ??? Blood, UA 06/12/2019 Negative  Negative Final   ??? RBC, UA 06/12/2019 <1  <=3 /HPF Final   ??? WBC, UA 06/12/2019 1  <=2 /HPF Final   ??? Squam Epithel, UA 06/12/2019 1  0 - 5 /HPF Final   ??? Bacteria, UA 06/12/2019 None Seen  None Seen /HPF Final   ??? Hyaline Casts, UA 06/12/2019 1  0 - 1 /LPF Final   ??? Mucus, UA 06/12/2019 Rare* None Seen /HPF Final     Lab Results   Component Value Date    PSA <0.10 06/12/2019    PSA 0.11 03/03/2019    PSA <0.10 11/28/2018    PSA 2.96 08/08/2018    PSA 1.79 04/25/2018    PSA 2.01 01/10/2018     3/17: Thyroid function WNL.

## 2019-09-18 NOTE — Unmapped (Addendum)
??   Start mirabegron 25 mg one pill every morning  ?? Continue tamsulosin 1 pill a day  ?? Continue oxybutynin 1 in the morning and 3 at night  ?? Try to h old the urge to pee as long as you can  ?? Make sure you are having a normal bowel movement every day  ?? I will refer you to pelvic floor physical therapy and they will call you. They will teach you exercises to help make your bladder stronger    I will see you again when you are here to see Dr Vernell Barrier      Calvin Parr, NP-C, OCN  Adult Nurse Practitioner  Urology and Medical Oncology    Notice: Starting July 14, 2019, many test results will be automatically released into MyChart. This may happen before your provider has a chance to review them. Your results will either be reviewed with you at your scheduled appointment or your provider or someone from their office will reach out to you to discuss the results. Thank you for your understanding during this transition.     If you have been prescribed a medication today, always read the package insert that comes with the medication.    In the event of an emergency, always call 911    Urology:  Main clinic & [place]: [phone]  Fax: [phone]    [facility]cer Hospital:  Phone: [phone]  Fax: [phone]    After hours/nights/weekends:  Hospital Operator: [phone]    [email or web address]  [email or web address]

## 2019-09-18 NOTE — Unmapped (Signed)
Pt received lupron injection in  Left upper quad. Gluteus. Tolerated it well. Applied guaze and band aid.

## 2019-09-20 NOTE — Unmapped (Signed)
Referral to Outpatient Oncology Palliative Care Clinic ([facility])     09/18/19: NEW pt referral to Outpatient Palliative Care Clinic [facility]) from Dr Vernell Barrier.      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  After adding enzalutamide, PSA dropped to <0.10 on 11/28/18, 0.11 on 03/03/19, and <0.10 on 06/12/19.    Dr Vernell Barrier note 09/18/19:  I will refer him to palliative Care for his back pain today.  He has had a not so good experience with the [facility] and pain management here in the past pressing him on use of narcotics which I actually think has not been good for him.  He has legitimate pain and is taking only PRN ibuprofen now but has a lot of breakthrough pain.  I wonder if there are nonpharmacologic options.  So we will ask Palliative Care if they can help him.  - He will meet about gas money today to help him out.  ??  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       Plan:  Appointment request sent to [facility] scheduler for next available, new patient appointment.        Allegra Lai RN BSN OCN MS  RN Clinical Coordinator  [facility] Outpatient Oncology Palliative Care [facility])  [facility]  Providers: Dr. Doreatha Martin, Dr. Kathlynn Grate, Burtis Junes ANP, and Laurene Footman CPP    Pager: [phone]  [facility] care Pager: [phone]  Phone: [phone]  Office: [phone]

## 2019-09-25 MED FILL — XTANDI 40 MG CAPSULE: ORAL | 30 days supply | Qty: 120 | Fill #6

## 2019-09-25 MED FILL — XTANDI 40 MG CAPSULE: 30 days supply | Qty: 120 | Fill #6 | Status: AC

## 2019-10-12 NOTE — Unmapped (Unsigned)
OUTPATIENT ONCOLOGY PALLIATIVE CARE    Principal Diagnosis: Mr. Montour is a 69 y.o. male with metastatic prostate cancer, diagnosed in 2008. Disease sites include spine, iliac bone.     Assessment/Plan:   1. Back pain  - Back pain/sciatica: L2-S1 decompression in 10/17 w Katheran Awe.  Off narcotic analgesics.  He is followed by Maryruth Eve in the Pain Clinic who is now managing his analgesics.  The patient feels his chronic back pain persists despite spine RT, and given this plus MRI findings, his pain is likely not related to prostate cancer. He previously saw Dr. Sabino Gasser of PM&R, and tried an epidural injection but the patient feels this did not help.  Has seen Charlett Blake for pain in this clinic too.    --> Gabapentin 600mg  BID   --> Pregabalin 150mg  TID   --> Tramadol 50mg  q6h PRN    Referral back to anesthesia pain (any further interventions?)  Physical therapy/yoga?    2. Fatigue  ??Ritalin not on med list    3. GOC       4. ACP      # Controlled substances risk management.   - Patient does not have a signed pain medication agreement with our team.   - [place] database was reviewed today and it was appropriate.   - Urine drug screen was not performed at this visit. Findings: not applicable.   - Patient has received information about safe storage and administration of medications.   - Patient has not received a prescription for narcan; is not applicable.       F/u: ***    ----------------------------------------  Referring Provider: Dr. Vernell Barrier  Oncology Team: GU   PCP: Rockie Neighbours, MD      HPI:   KOVEN BELINSKY is a 69 y.o. 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 ??After adding enzalutamide, PSA dropped to <0.10 on 11/28/18, 0.11 on 03/03/19, and <0.10 on 06/12/19.    - I will refer him to palliative Care for his back pain today.  He has had a not so good [facility] with [facility] and pain management here in the past pressing him on use of narcotics which I actually think has not been good for him.  He has legitimate pain and is taking only PRN ibuprofen now but has a lot of breakthrough pain.  I wonder if there are nonpharmacologic options.  So we will ask Palliative Care if they can help him.    Oncology history:   - Diagnosed 2008 with prostate cancer, PSA 13.5.   - Prostate biopsy on 04/02/07 w Gleason 3+4=7   - 2009 Radiation therapy to w Dr. Nonie Hoyer 78 Gy.   - 2012-13: Rising PSA   - 11/13: Saw [place] rad onc, felt not candidate for salvage RT   - 2/14: BS c/w 2009 with suspicious increased uptake at T5, L inferior iliac bone, subtle at L1.   - CT A/P shows sclerosis at L4-5 equivocal for DJD vs metastases, I discussed w radiology, felt most likely metastatic.   - 3/14: Lupron started   - 12/14: Lupron held for intermittent approach  - 11/13/13: Restarted Lupron due to  PSA rise with worsening back pain  - 8/15: Palliative RT to spine in [place], [place].  - 05/25/14:  MRI spine: Multilevel degenerative changes, most prominent at L3-4 and L4-L5, where there is marked spinal canal stenosis and crowding with potential indentation of the nerve roots. Severe bilateral neural foraminal narrowing at L4-5 and moderate to marked neuroforaminal narrowing at L3-4 on the right. Additional multilevel degenerative changes as outlined above.    - 08/15/15: Abnormal focal marrow signal and enhancement at the endplates surrounding the L4-5 disc space are favored to represent acutely inflamed Schmorl's nodes. Metastatic disease is felt less likely but is not entirely excluded, particularly given enlargement of the lesion at the L5 level. Continued follow-up recommended.  - 6/17: Bone mineral density: The femoral neck density is mildly low, but the spine and total femoral densities are normal.  - 10/17: L2-S1 decompression surgery w Dr. 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  - 12/17: L ankle pain, negative xray.  - 04/20/18: CT A/P: Stable heterogeneity of the left iliac wing representing known metastatic disease. No new evidence of metastatic disease within the abdomen/pelvis.  - 04/25/18: Bone scan: Increased radiotracer activity within the left iliac bone concerning for osseous metastases.  - 08/08/18: Castration resistant by PSA - startedenzalutamide      Current cancer-directed therapy: Lupron q33mo, enzalutamide    Symptom Review:  General: ***  Pain: ***ankle + back pain  Fatigue: ***   Mobility: ***  Sleep: ***  Appetite: ***  Nausea: ***  Bowel function: ***  Dyspnea: ***  Secretions: ***  Mood: ***  Hot flashes: gabapentin 300mg  TID    Palliative Performance Scale: {Palliative Performance MWUXL:24401027}    Coping/Support Issues: ***    Goals of Care: ***    Social History: ***  Name of primary support:   Occupation:  Hobbies:  Current residence / distance from [facility]:    Advance Care Planning: ***  HCPOA:  Natural surrogate decision maker:  Living Will:  ACP note:     Objective     Opioid Risk Tool: not utilized at today's visit    Oncology History Overview Note   Referring Physician:  Dr. Frederic Jericho    Assessment:  [phone] initially with intermediate risk prostate cancer (PSA 13.5, Gleason 3+4=7) s/p external beam radiation completed in 2009 with rising PSA after treatment, now with likely bone mets (L-spine, iliac bone) and on intermittent androgen deprivation therapy.      HPI:  - Diagnosed 2008 with prostate cancer, PSA 13.5.   04/02/07- TRUS Bx  w Gleason 3+4=7   07/22/2007 Completed External beam radiation (78Gy) with curative intent with Dr. Nonie Hoyer  2012-13: Rising PSA (see trend below)  - 11/13: Saw [place] rad onc, felt not candidate for salvage RT     05/21/12 NM Bone scan c/w 2009 with suspicious increased uptake at L5, L inferior iliac bone, subtle at L1.     05/21/12 CT A/P shows sclerosis at L4-5 equivocal for DJD vs mets, but likely mets per radiology    PSA Trend  05/09/10 2.0  11/07/2010 2.8  02/03/2011 2.9  08/18/2011 3.5  01/19/2012 4.8  02/15/2012 4.5  04/19/2012 5.4  05/23/2012 6.1  06/27/2012 3.0 (started lupron)  09/26/2012 0.1 (received lupron)  12/26/2012 0.2 (received lupron)  03/20/2013 0.3   07/24/2012 2.4  11/06/2013 6.9 (starting casodex)       Prostate cancer metastatic to bone (CMS-HCC)   09/26/2012 Initial Diagnosis  Prostate cancer metastatic to bone (CMS-HCC)     08/08/2018 - 08/08/2018 Endocrine/Hormone Therapy    OP LEUPROLIDE (ELIGARD) 45 MG EVERY 6 MONTHS  Plan Provider: Chrisandra Netters, MD     08/08/2018 Endocrine/Hormone Therapy    OP LEUPROLIDE (22.5 MG EVERY 3 MONTHS)  Plan Provider: Chrisandra Netters, MD         Patient Active Problem List   Diagnosis   ??? 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 Lethargy   ??? Chronic bilateral low back pain   ??? Essential hypertension   ??? Allergic rhinitis   ??? Uncomplicated asthma   ??? Lumbar radiculopathy   ??? Acute left ankle pain   ??? Hot flashes   ??? Infection and inflammatory reaction due to implanted penile prosthesis, initial encounter (CMS-HCC)   ??? Back pain   ??? Chronic ankle pain   ??? Nocturia   ??? Acute respiratory distress   ??? Acute respiratory failure with hypoxia (CMS-HCC)   ??? Anemia   ??? Colon cancer (CMS-HCC)   ??? Constipation due to opioid therapy   ??? Current every day smoker   ??? High cholesterol   ??? Hyponatremia   ??? Impotence of organic origin   ??? Leukocytosis   ??? LLL pneumonia   ??? Luetscher's syndrome   ??? Malignant neoplasm of prostate (CMS-HCC)   ??? Opioid dependence (CMS-HCC)   ??? Pneumonia   ??? Rotator cuff arthropathy, left   ??? Shoulder arthritis     Past Medical History:   Diagnosis Date   ??? Acid reflux    ??? Hyperlipemia    ??? Hypertension    ??? Prostate cancer (CMS-HCC)     bone metastasis     Past Surgical History:   Procedure Laterality Date   ??? PR INSERT,INFLATABLE PENILE PROSTHESIS N/A 03/16/2017    Procedure: INSERTION MULTI-COMPONENT INFLATABLE PENILE PROSTHESIS INCLUDING PLACEMENT OF PUMP, CYLINDERS & RESERVOIR;  Surgeon: Turner Daniels, MD;  Location: [facility] OR [facility];  Service: Urology   ??? PR LAMINEC/FACETECT/FORAMIN,EACH ADDNL N/A 01/31/2016    Procedure: LAMINECTMY 1 SEGMT-UNI/BIL; EA ADD CERV/THOR/LUM X4;  Surgeon: Timothy Lasso, MD;  Location: [facility] OR [facility];  Service: Orthopedics   ??? PR LAMINEC/FACETECT/FORAMIN,LUMBAR 1 SEG N/A 01/31/2016    Procedure: LAMINECTOMY, FACETECTOMY & FORAMINOTOMY(UNI/BILAT W/DECOMP SPINAL CORD), SINGLE VERTEBRAL SEGMENT; LUMBAR;  Surgeon: Timothy Lasso, MD;  Location[facility] OR [facility];  Service: Orthopedics   ??? PR REMVL,INFLAT PENILE PROSTH W/O REPLACMT N/A 04/20/2017    Procedure: REMOVAL OF ALL COMPONENTS OF A MULTI-COMPONENT, INFLATABLE PENILE PROSTH WO REPLACEMENT OF PROSTHESIS;  Surgeon: Turner Daniels, MD;  Location: [facility] OR [facility];  Service: Urology       Current Outpatient Medications   Medication Sig Dispense Refill   ??? enzalutamide (XTANDI) 40 mg capsule Take 4 capsules (160 mg) by mouth daily. 120 capsule 6   ??? ezetimibe (ZETIA) 10 mg tablet Take 10 mg by mouth daily. Frequency:QD   Dosage:10   MG  Instructions:  Note:Dose: 10MG      ??? gabapentin (NEURONTIN) 600 MG tablet Take 600 mg by mouth Two (2) times a day.     ??? hydroCHLOROthiazide (HYDRODIURIL) 25 MG tablet TAKE 1 TABLET BY MOUTH ONCE DAILY. 30 tablet 11   ??? lisinopril (PRINIVIL,ZESTRIL) 40 MG tablet Take 40 mg by mouth daily.      ??? mirabegron (MYRBETRIQ) 25 mg Tb24 extended-release tablet Take 1 tablet (25 mg total) by mouth daily. 30  tablet 6   ??? omeprazole (PRILOSEC) 20 MG capsule Take 20 mg by mouth daily.     ??? oxybutynin (DITROPAN) 5 MG tablet Take by mouth. 5 mg qAM and 15 mg qPM  0   ??? pregabalin (LYRICA) 150 MG capsule Take 1 capsule (150 mg total) by mouth three (3) times a day (at 6am, noon and 6pm). 90 capsule 1   ??? tamsulosin (FLOMAX) 0.4 mg capsule Take 1 capsule (0.4 mg total) by mouth daily. 30 capsule 11   ??? traMADol (ULTRAM) 50 mg tablet Take 50 mg by mouth every six (6) hours as needed for pain.       No current facility-administered medications for this visit.     Allergies:   Allergies   Allergen Reactions   ??? Poison [employer]- Rash per patient     Family History:  Cancer-related family history is not on file.  He indicated that the status of his mother is unknown. He indicated that the status of his father is unknown.    REVIEW OF SYSTEMS:  A comprehensive review of 10 systems was negative except for pertinent positives noted in HPI.    PHYSICAL EXAM: Limited given nature of video visit.   GEN: Awake and alert, pleasant appearing male in no acute distress  PSYCH: Alert and oriented to person, place and time. Euthymic.  HEENT: Pupils equally round without scleral icterus. No facial asymmetry.  CV: Regular rhythm with normal rate, no murmurs.  LUNGS: No increased work of breathing.  ABD: Soft and non-tender with no distention  SKIN: No rashes, petechiae or jaundice noted  EXT: No edema noted of the lower extremities  NEURO: Normal gait and coordination.    Lab Results   Component Value Date    CREATININE 1.70 (H) 09/18/2019     Lab Results   Component Value Date    ALKPHOS 111 09/18/2019    BILITOT 0.3 09/18/2019    BILIDIR <0.10 04/25/2018    PROT 7.3 09/18/2019    ALBUMIN 4.3 09/18/2019    ALT 13 09/18/2019    AST 23 09/18/2019            {    Coding tips - Do not edit this text, it will delete upon signing of note!    ?? Telephone visits [phone] for Physicians and APP??s and [phone] for Non- Physician Clinicians)- Only use minutes on the phone to determine level of service.    ?? Video visits [phone]) - Use both minutes on video and pre/post minutes to determine level of service.       :[id number]}    I spent *** minutes on the {phone audio video visit:67489} with the patient on the date of service. I spent an additional *** minutes on pre- and post-visit activities on the date of service.     The patient was physically located in [place] or a state in which I am permitted to provide care. The patient and/or parent/guardian understood that s/he may incur co-pays and cost sharing, and agreed to the telemedicine visit. The visit was reasonable and appropriate under the circumstances given the patient's presentation at the time.    The patient and/or parent/guardian has been advised of the potential risks and limitations of this mode of treatment (including, but not limited to, the absence of in-person examination) and has agreed to be treated using telemedicine. The patient's/patient's family's questions regarding telemedicine have been answered.  If the visit was completed in an ambulatory setting, the patient and/or parent/guardian has also been advised to contact their provider???s office for worsening conditions, and seek emergency medical treatment and/or call 911 if the patient deems either necessary.      Ulanda Edison, MD, [facility], HPM Fellow  [facility] Outpatient Oncology Palliative Care

## 2019-10-13 NOTE — Unmapped (Signed)
Outpatient SW Note    Referral Source:  Oncology Clinic Nurse navigator    Patient Status and Psychosocial Issues:     Patient is a 69 year-old metastatic Prostate Cancer patient who is being treated at [facility]. Pt is being referred to social work services due to challenges with psychosocial issues while being treated for his/her cancer diagnosis.     Coping Issues/Concerns:    Pt is having challenges with transportation expenses (gas expenses). Pt would like assistance with gas cards if possible.     Action Taken:   SW completed oncology social work assessment. Pt qualifies for assistance based on income as he receives social security of $875 per month. Pt does not meet frequency criteria of 1 clinic visit per month. SW provided one-time assistance today as patient states that he doesn't have gas to get back home. Pt does think that his visits may increase in the future at which time Sw will request CPAF assistance for patient. SW to monitor.      Outside Agency Contacts: None    Follow-up planned: None

## 2019-10-21 DIAGNOSIS — C61 Malignant neoplasm of prostate: Principal | ICD-10-CM

## 2019-10-21 DIAGNOSIS — C7951 Secondary malignant neoplasm of bone: Principal | ICD-10-CM

## 2019-10-21 MED ORDER — XTANDI 40 MG CAPSULE
ORAL_CAPSULE | Freq: Every day | ORAL | 6 refills | 30.00000 days
Start: 2019-10-21 — End: ?

## 2019-10-21 NOTE — Unmapped (Signed)
[facility] Refill Coordination Note    Specialty Medication(s) to be Shipped:   Hematology/Oncology: [name]    Other medication(s) to be shipped: n/a     [name], DOB: 1951-03-20  Phone: [phone] (home)       All above HIPAA information was verified with patient.     Was a Nurse, learning disability used for this call? No    Completed refill call assessment today to schedule patient's medication shipment from the [facility] [phone]).       Specialty medication(s) and dose(s) confirmed: Regimen is correct and unchanged.   Changes to medications: Garritt reports no changes at this time.  Changes to insurance: No  Questions for the pharmacist: No    Confirmed patient received Welcome Packet with first shipment. The patient will receive a drug information handout for each medication shipped and additional FDA Medication Guides as required.       DISEASE/MEDICATION-SPECIFIC INFORMATION        N/A    SPECIALTY MEDICATION ADHERENCE     Medication Adherence    Patient reported X missed doses in the last month: 0  Specialty Medication: Xtandi 40mg   Patient is on additional specialty medications: No  Any gaps in refill history greater than 2 weeks in the last 3 months: no  Demonstrates understanding of importance of adherence: yes  Informant: patient  Reliability of informant: reliable  Confirmed plan for next specialty medication refill: delivery by pharmacy  Refills needed for supportive medications: not needed                Xtandi 40 mg: 11 days of medicine on hand         SHIPPING     Shipping address confirmed in Epic.     Delivery Scheduled: Yes, Expected medication delivery date: 10/24/2019.  However, Rx request for refills was sent to the provider as there are none remaining.     Medication will be delivered via UPS to the prescription address in Epic [facility].    Simmie Garin D Camira Geidel   [facility] Specialty Technician

## 2019-10-23 DIAGNOSIS — C7951 Secondary malignant neoplasm of bone: Principal | ICD-10-CM

## 2019-10-23 DIAGNOSIS — C61 Malignant neoplasm of prostate: Principal | ICD-10-CM

## 2019-10-23 NOTE — Unmapped (Signed)
Calvin Obrien 's XTANDI shipment will be delayed as a result of no refills remain on the prescription.      I have reached out to the patient and was unable to leave a message. We will call the patient back to reschedule the delivery upon resolution. We have not confirmed the new delivery date.

## 2019-10-23 NOTE — Unmapped (Signed)
Opened in error

## 2019-10-27 DIAGNOSIS — C61 Malignant neoplasm of prostate: Principal | ICD-10-CM

## 2019-10-27 DIAGNOSIS — C7951 Secondary malignant neoplasm of bone: Principal | ICD-10-CM

## 2019-10-29 NOTE — Unmapped (Signed)
Calvin Obrien 's xtandi shipment will be canceled  as a result of no refills remain on the prescription.      I have reached out to the patient and communicated the delivery change. We will not reschedule the medication due to provider did not response to refill request. and have removed this/these medication(s) from the work request.  We have canceled this work request.

## 2019-10-30 DIAGNOSIS — C7951 Secondary malignant neoplasm of bone: Principal | ICD-10-CM

## 2019-10-30 DIAGNOSIS — C61 Malignant neoplasm of prostate: Principal | ICD-10-CM

## 2019-10-30 MED ORDER — XTANDI 40 MG CAPSULE: 160 mg | capsule | Freq: Every day | 11 refills | 30 days | Status: AC

## 2019-10-30 MED ORDER — XTANDI 40 MG CAPSULE
ORAL_CAPSULE | Freq: Every day | ORAL | 11 refills | 30.00000 days | Status: CP
Start: 2019-10-30 — End: 2019-10-30
  Filled 2019-11-03: qty 120, 30d supply, fill #0

## 2019-10-30 NOTE — Unmapped (Signed)
Calvin Obrien's prescription for Calvin Obrien needs to be rescheduled due to prescription received by [facility].  We called the patient but LVM.  We will reschedule his delivery to an agreed upon delivery date once patient is contacted.

## 2019-11-03 MED FILL — XTANDI 40 MG CAPSULE: 30 days supply | Qty: 120 | Fill #0 | Status: AC

## 2019-11-03 NOTE — Unmapped (Signed)
Spoke with patient. Xtandi now scheduled for delivery on 8/3, via UPS to prescription address. Patient verbalized understanding of storing Xtandi at room temperature, and throwing away ice paks that are being sent with the medication for transport. No questions for the pharmacist.

## 2019-11-25 NOTE — Unmapped (Signed)
[facility] Refill Coordination Note    Specialty Medication(s) to be Shipped:   Hematology/Oncology: [name]    Other medication(s) to be shipped: No additional medications requested for fill at this time     TYJAI MATUSZAK, DOB: Jan 07, 1951  Phone: [phone] (home)       All above HIPAA information was verified with patient.     Was a Nurse, learning disability used for this call? No    Completed refill call assessment today to schedule patient's medication shipment from the [facility] [phone]).       Specialty medication(s) and dose(s) confirmed: Regimen is correct and unchanged.   Changes to medications: Pratt reports no changes at this time.  Changes to insurance: No  Questions for the pharmacist: No    Confirmed patient received Welcome Packet with first shipment. The patient will receive a drug information handout for each medication shipped and additional FDA Medication Guides as required.       DISEASE/MEDICATION-SPECIFIC INFORMATION        N/A    SPECIALTY MEDICATION ADHERENCE     Medication Adherence    Patient reported X missed doses in the last month: 0  Specialty Medication: Xtandi 40mg   Patient is on additional specialty medications: No  Informant: patient                Xtandi 40 mg: 9 days of medicine on hand         SHIPPING     Shipping address confirmed in Epic.     Delivery Scheduled: Yes, Expected medication delivery date: 12/02/19.     Medication will be delivered via UPS to the prescription address in Epic [place].    [name]   [facility] Specialty Technician

## 2019-12-01 MED FILL — XTANDI 40 MG CAPSULE: 30 days supply | Qty: 120 | Fill #1 | Status: AC

## 2019-12-01 MED FILL — XTANDI 40 MG CAPSULE: ORAL | 30 days supply | Qty: 120 | Fill #1

## 2019-12-24 NOTE — Unmapped (Addendum)
Attempted to reach patient x3 to remind him to bring all of his medications to his appt tomorrow to verify what he is taking. No answer. Left VM.    Calvin Obrien  ===View-only below this line===  ----- Message from Karie Georges, RN sent at 09/18/2019  1:37 PM EDT -----  Remind him to bring all of his medications to appt tomorrow to verify what he's taking

## 2019-12-25 ENCOUNTER — Ambulatory Visit
Admit: 2019-12-25 | Discharge: 2019-12-25 | Payer: MEDICARE | Attending: Nurse Practitioner | Primary: Nurse Practitioner

## 2019-12-25 ENCOUNTER — Encounter: Admit: 2019-12-25 | Discharge: 2019-12-25 | Payer: MEDICARE | Attending: Medical Oncology | Primary: Medical Oncology

## 2019-12-25 ENCOUNTER — Encounter
Admit: 2019-12-25 | Discharge: 2019-12-25 | Payer: MEDICARE | Attending: Student in an Organized Health Care Education/Training Program | Primary: Student in an Organized Health Care Education/Training Program

## 2019-12-25 ENCOUNTER — Encounter: Admit: 2019-12-25 | Discharge: 2019-12-25 | Payer: MEDICARE

## 2019-12-25 DIAGNOSIS — C61 Malignant neoplasm of prostate: Principal | ICD-10-CM

## 2019-12-25 DIAGNOSIS — G8929 Other chronic pain: Principal | ICD-10-CM

## 2019-12-25 DIAGNOSIS — C7951 Secondary malignant neoplasm of bone: Principal | ICD-10-CM

## 2019-12-25 DIAGNOSIS — M545 Low back pain: Principal | ICD-10-CM

## 2019-12-25 DIAGNOSIS — Z515 Encounter for palliative care: Principal | ICD-10-CM

## 2019-12-25 LAB — CBC W/ AUTO DIFF
BASOPHILS RELATIVE PERCENT: 0.2 %
EOSINOPHILS ABSOLUTE COUNT: 0.4 10*9/L (ref 0.0–0.4)
EOSINOPHILS RELATIVE PERCENT: 3.9 %
HEMOGLOBIN: 13.7 g/dL (ref 13.5–17.5)
LARGE UNSTAINED CELLS: 1 % (ref 0–4)
LYMPHOCYTES ABSOLUTE COUNT: 2.3 10*9/L (ref 1.5–5.0)
LYMPHOCYTES RELATIVE PERCENT: 21.4 %
MEAN CORPUSCULAR HEMOGLOBIN: 31.6 pg (ref 26.0–34.0)
MEAN PLATELET VOLUME: 10.7 fL — ABNORMAL HIGH (ref 7.0–10.0)
MONOCYTES ABSOLUTE COUNT: 0.6 10*9/L (ref 0.2–0.8)
MONOCYTES RELATIVE PERCENT: 5.5 %
NEUTROPHILS ABSOLUTE COUNT: 7.1 10*9/L (ref 2.0–7.5)
NEUTROPHILS RELATIVE PERCENT: 67.6 %
PLATELET COUNT: 242 10*9/L (ref 150–440)
RED BLOOD CELL COUNT: 4.35 10*12/L — ABNORMAL LOW (ref 4.50–5.90)
RED CELL DISTRIBUTION WIDTH: 13.7 % (ref 12.0–15.0)
WBC ADJUSTED: 10.6 10*9/L (ref 4.5–11.0)

## 2019-12-25 LAB — CREATININE: Creatinine:MCnc:Pt:Ser/Plas:Qn:: 1.3

## 2019-12-25 LAB — COMPREHENSIVE METABOLIC PANEL
ALBUMIN: 3.8 g/dL (ref 3.4–5.0)
ALKALINE PHOSPHATASE: 146 U/L — ABNORMAL HIGH (ref 46–116)
ALT (SGPT): 13 U/L (ref 10–49)
ANION GAP: 6 mmol/L (ref 5–14)
AST (SGOT): 18 U/L (ref <=34)
BILIRUBIN TOTAL: 0.5 mg/dL (ref 0.3–1.2)
BLOOD UREA NITROGEN: 18 mg/dL (ref 9–23)
BUN / CREAT RATIO: 14
CO2: 30 mmol/L (ref 20.0–31.0)
EGFR CKD-EPI AA MALE: 63 mL/min/{1.73_m2} (ref >=60)
EGFR CKD-EPI NON-AA MALE: 55 mL/min/{1.73_m2} — ABNORMAL LOW (ref >=60)
GLUCOSE RANDOM: 106 mg/dL (ref 70–179)
POTASSIUM: 4.4 mmol/L (ref 3.5–5.1)
PROTEIN TOTAL: 7.1 g/dL (ref 5.7–8.2)
SODIUM: 137 mmol/L (ref 135–145)

## 2019-12-25 LAB — PLATELET COUNT: Platelets:NCnc:Pt:Bld:Qn:Automated count: 242

## 2019-12-25 LAB — TESTOSTERONE TOTAL: Testosterone:MCnc:Pt:Ser/Plas:Qn:: 19 — ABNORMAL LOW

## 2019-12-25 LAB — PROSTATE SPECIFIC ANTIGEN: Prostate specific Ag:MCnc:Pt:Ser/Plas:Qn:: <0.04

## 2019-12-25 LAB — TESTOSTERONE: TESTOSTERONE TOTAL: 19 ng/dL — ABNORMAL LOW

## 2019-12-25 MED ADMIN — leuprolide (LUPRON) injection 22.5 mg: 22.5 mg | INTRAMUSCULAR | @ 14:00:00 | Stop: 2019-12-25

## 2019-12-25 NOTE — Unmapped (Signed)
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  After adding enzalutamide, PSA dropped to <0.10 on 11/28/18, 0.11 on 03/03/19, <0.10 on 06/12/19, <0.10 on 09/18/19, <0.04 on 12/25/19.    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  He is being seen by Palliative Care.    He is feeling very well with good energy.    Based on PSA values and how he is feeling, we will continue current therapy with Lupron and enzalutamide.    Plan:   - ADT: He is receiving 46-month Lupron 22.5mg  (12/25/19).  (I tried to change to 92-month injection to reduce exposure to health system due to coronavirus but insurance refused this apparently.)  - Continue enzalutamide.  - Follow PSA.  - I refered him to Palliative Care previously for his back pain.  He has had a not so good [facility] with [facility] and pain management here in the past pressing him on use of narcotics which I actually think has not been good for him.  He has legitimate pain and is taking only PRN ibuprofen now but has a lot of breakthrough pain.  I wonder if there are nonpharmacologic options.  So we will ask Palliative Care if they can help him.  - He will meet about gas money today to help him out.    - Urinary leakage: He has seen Barkley Bruns, NP for evaluation.    - Ritalin 10mg  for fatigue.  - Back pain/sciatica: L2-S1 decompression in 10/17 w Katheran Awe.  Off narcotic analgesics.  He is followed by Maryruth Eve in the Pain Clinic who is now managing his analgesics.  The patient feels his chronic back pain persists despite spine RT, and given this plus MRI findings, his pain is likely not related to prostate cancer. He previously saw Dr. Sabino Gasser of PM&R, and tried an epidural injection but the patient feels this did not help.  Has seen Charlett Blake for pain in this clinic too.    - Ankle pain: Persists since December so >3 months, xray was negative, and say orthopaedics but focus was on pain management. Will refer to PM&R for this.   - Nocturia improved with Ditropan: has met with w [facility] in past.  - Hot flashes - on Gabapentin 300 TID, Katina Degree PhD has counseled, pharmacy seeing today again. Stable  - Recommended Ca/Vit D.  - For ED, has seen Dr. Colon Branch in the past.  Viagra does not help.  He has spoken with Urology about options.    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  - BMD 6/17: The femoral neck density is mildly low, but the spine and total femoral densities are normal.    I spent 43 minutes in records review, seeing the patient, documentation, and coordination today.    Return in 3 months in person for Lupron and evaluation and labs.     -------------------------------    Other Physicians:   Dr. Maryruth Eve [facility])  Dr. Carnella Guadalajara [facility] PM&R)  Dr. Nolon Rod (referring; Radiation Oncology)  Dr. Baxter Flattery (Urology)   Dr. Nuala Alpha (Anesthesia pain)   Dr. Hoy Morn (Urology, RE: ED)   Dr. 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     Current therapy: Lupron    Oncology history:   - Diagnosed 2008 with prostate cancer, PSA 13.5.   - Prostate biopsy on 04/02/07 w Gleason 3+4=7   - 2009 Radiation therapy to w Dr. Nonie Hoyer 78 Gy.   - 2012-13: Rising PSA   - 11/13: Saw  rad onc, felt not candidate for salvage RT   - 2/14: BS c/w 2009 with suspicious increased uptake at T5, L inferior iliac bone, subtle at L1.   - CT A/P shows sclerosis at L4-5 equivocal for DJD vs metastases, I discussed w radiology, felt most likely metastatic.   - 3/14: Lupron started   - 12/14: Lupron held for intermittent approach  - 11/13/13: Restarted Lupron due to PSA rise with worsening back pain  - 8/15: Palliative RT to spine in [place], [place].  - 05/25/14:  MRI spine: Multilevel degenerative changes, most prominent at L3-4 and L4-L5, where there is marked spinal canal stenosis and crowding with potential indentation of the nerve roots. Severe bilateral neural foraminal narrowing at L4-5 and moderate to marked neuroforaminal narrowing at L3-4 on the right. Additional multilevel degenerative changes as outlined above.    - 08/15/15: Abnormal focal marrow signal and enhancement at the endplates surrounding the L4-5 disc space are favored to represent acutely inflamed Schmorl's nodes. Metastatic disease is felt less likely but is not entirely excluded, particularly given enlargement of the lesion at the L5 level. Continued follow-up recommended.  - 6/17: Bone mineral density: The femoral neck density is mildly low, but the spine and total femoral densities are normal.  - 10/17: L2-S1 decompression surgery w Dr. 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  - 12/17: L ankle pain, negative xray.  - 04/20/18: CT A/P: Stable heterogeneity of the left iliac wing representing known metastatic disease. No new evidence of metastatic disease within the abdomen/pelvis.  - 04/25/18: Bone scan: Increased radiotracer activity within the left iliac bone concerning for osseous metastases.  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  Ongoing back pain w L sciatica.  Hot flashes persist on Lupron, daily.  Nocturia/chronic dysuria: much better with Ditropan 0-1x/night.  No pain.  Persistent ED.  Remainder of 10 system ROS otherwise negative.    Physical exam:   There were no vitals taken for this visit.  NAD  A+Ox3  No visible rash  No dyspnea or cough  No apparent neurological deficit    Current Outpatient Medications   Medication Sig Dispense Refill   ??? enzalutamide (XTANDI) 40 mg capsule Take 4 capsules (160 mg) by mouth daily. 120 capsule 11   ??? ezetimibe (ZETIA) 10 mg tablet Take 10 mg by mouth daily. Frequency:QD   Dosage:10   MG  Instructions:  Note:Dose: 10MG      ??? gabapentin (NEURONTIN) 600 MG tablet Take 600 mg by mouth Two (2) times a day.     ??? hydroCHLOROthiazide (HYDRODIURIL) 25 MG tablet TAKE 1 TABLET BY MOUTH ONCE DAILY. 30 tablet 11   ??? lisinopril (PRINIVIL,ZESTRIL) 40 MG tablet Take 40 mg by mouth daily.      ???  mirabegron (MYRBETRIQ) 25 mg Tb24 extended-release tablet Take 1 tablet (25 mg total) by mouth daily. 30 tablet 6   ??? omeprazole (PRILOSEC) 20 MG capsule Take 20 mg by mouth daily.     ??? oxybutynin (DITROPAN) 5 MG tablet Take by mouth. 5 mg qAM and 15 mg qPM  0   ??? pregabalin (LYRICA) 150 MG capsule Take 1 capsule (150 mg total) by mouth three (3) times a day (at 6am, noon and 6pm). 90 capsule 1   ??? tamsulosin (FLOMAX) 0.4 mg capsule Take 1 capsule (0.4 mg total) by mouth daily. 30 capsule 11   ??? traMADol (ULTRAM) 50 mg tablet Take 50 mg by mouth every six (6) hours as needed for pain.       Current Facility-Administered Medications   Medication Dose Route Frequency Provider Last Rate Last Admin   ??? leuprolide (LUPRON) injection 22.5 mg  22.5 mg Intramuscular Once Chrisandra Netters, MD           Allergies   Allergen Reactions   ??? Poison [employer]- Rash per patient       Laboratory:   06/27/12: PSA=3 (on Casodex), testosterone >800     Lab Results   Component Value Date    PSA Diagnostic 0.6 05/14/2014    PSA Diagnostic 0.5 02/05/2014    PSA Diagnostic 6.9 (H) 11/06/2013    PSA Diagnostic 2.4 07/24/2013    PSA Diagnostic 0.3 03/20/2013    PSA Diagnostic 0.2 12/26/2012       No visits with results within 2 Week(s) from this visit.   Latest known visit with results is:   Lab on 09/18/2019   Component Date Value Ref Range Status   ??? PSA 09/18/2019 <0.10  0.00 - 4.00 ng/mL Final   ??? Testosterone 09/18/2019 38* 179 - 756 ng/dL Final   ??? Sodium 16/01/9603 138  135 - 145 mmol/L Final   ??? Potassium 09/18/2019 4.1  3.5 - 5.0 mmol/L Final   ??? Chloride 09/18/2019 101  98 - 107 mmol/L Final   ??? Anion Gap 09/18/2019 8  7 - 15 mmol/L Final   ??? CO2 09/18/2019 29.0  22.0 - 30.0 mmol/L Final   ??? BUN 09/18/2019 26* 7 - 21 mg/dL Final   ??? Creatinine 09/18/2019 1.70* 0.70 - 1.30 mg/dL Final   ??? BUN/Creatinine Ratio 09/18/2019 15   Final   ??? EGFR CKD-EPI Non-African American,* 09/18/2019 40* >=60 mL/min/1.48m2 Final   ??? EGFR CKD-EPI African American, Male 09/18/2019 46* >=60 mL/min/1.64m2 Final   ??? Glucose 09/18/2019 112  70 - 179 mg/dL Final   ??? Calcium 54/12/8117 10.0  8.5 - 10.2 mg/dL Final   ??? Albumin 14/78/2956 4.3  3.5 - 5.0 g/dL Final   ??? Total Protein 09/18/2019 7.3  6.5 - 8.3 g/dL Final   ??? Total Bilirubin 09/18/2019 0.3  0.0 - 1.2 mg/dL Final   ??? AST 21/30/8657 23  19 - 55 U/L Final   ??? ALT 09/18/2019 13  <50 U/L Final   ??? Alkaline Phosphatase 09/18/2019 111  38 - 126 U/L Final     Lab Results   Component Value Date    PSA <0.10 09/18/2019    PSA <0.10 06/12/2019    PSA 0.11 03/03/2019    PSA <0.10 11/28/2018    PSA 2.96 08/08/2018    PSA 1.79 04/25/2018     3/17: Thyroid function WNL.

## 2019-12-25 NOTE — Unmapped (Signed)
Nice seeing you today.   If you have any questions please call the Nurse Navigator in the office at [phone], or [phone]) [phone].  Frederic Jericho, MD

## 2019-12-25 NOTE — Unmapped (Signed)
OUTPATIENT ONCOLOGY PALLIATIVE CARE    Principal Diagnosis: Calvin Obrien is a 69 y.o. 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    Assessment/Plan:   1. Pain: Chronic in nature, related to underlying spinal stenosis and degenerative joint disease.  Discussion with patient today, we reviewed the fact that his current pain is related to his chronic back pain rather than cancer related pain.  Discussed that unfortunately opioids are not indicated for this type of pain.  We reviewed various types of adjuvant nonopioid therapies as well as nonpharmacologic interventions, all of which he states he has tried previously.  I recommended considering adding scheduled Tylenol and reengaging with physical therapy.      # Controlled substances risk management.  --No controlled substances being prescribed by our clinic    F/u: None, recommended patient continues to follow with his current pain management clinic.    ----------------------------------------  Referring Provider: Frederic Jericho  Oncology Team: GU oncology  PCP: Rockie Neighbours, MD      HPI: Calvin Obrien with metastatic prostate cancer.    Current cancer-directed therapy: on Lupron (since 2014), with enzalutamide added per shared decision in 5/20 for rising PSA (from non-detectable to 2.96) although imaging showed stable disease in iliac similar to 2014.  After adding enzalutamide, PSA dropped to <0.10 on 11/28/18, 0.11 on 03/03/19, <0.10 on 06/12/19, <0.10 on 09/18/19, <0.04 on 12/25/19.    Symptom Review:  General: Overall feels like she is feeling pretty good  Pain: Significant lower back pain that is worse with movement or standing.  Relieved with sitting.  Currently on Lyrica, gabapentin and tramadol.  Has previously tried Cymbalta lidocaine patches, topical gels.  Fatigue: Denies  Mobility: Limited by chronic back pain  Sleep: Stable  Appetite: Good  Nausea: None  Bowel function: Regular  Dyspnea: None  Secretions: None  Mood: Overall good    Palliative Performance Scale: 80% - Ambulation: Full / Normal Activity with effort, some evidence of disease / Self-Care:Full / Intake: Normal or reduced / Level of Conscious: Full       Coping/Support Issues: Coping well    Goals of Care: Did not address today    Social History: Well supported by son    Advance Care Planning: None  HCPOA:  Natural surrogate decision maker:  Living Will:  ACP note:     Objective     Opioid Risk Tool:    Male  Male    Family history of substance abuse      Alcohol  1  3    Illegal drugs  2  3    Rx drugs  4  4    Personal history of substance abuse      Alcohol  3  3    Illegal drugs  4  4    Rx drugs  5  5    Age between 81???45 years  1  1    History of preadolescent sexual abuse  3  0    Psychological disease      ADD, OCD, bipolar, schizophrenia  2  2    Depression  1  1       Total: Did not evaluate given we are not prescribing  (<3 low risk, 4-7 moderate risk, >8  high risk)    Oncology History Overview Note   Referring Physician:  Dr. Frederic Jericho    Assessment:  [phone] initially with intermediate risk prostate cancer (PSA 13.5, Gleason 3+4=7) s/p external beam radiation completed in 2009 with rising PSA after treatment, now with likely bone mets (L-spine, iliac bone) and on intermittent androgen deprivation therapy.      HPI:  - Diagnosed 2008 with prostate cancer, PSA 13.5.   04/02/07- TRUS Bx  w Gleason 3+4=7   07/22/2007 Completed External beam radiation (78Gy) with curative intent with Dr. Nonie Hoyer  2012-13: Rising PSA (see trend below)  - 11/13: Saw [place] rad onc, felt not candidate for salvage RT     05/21/12 NM Bone scan c/w 2009 with suspicious increased uptake at L5, L inferior iliac bone, subtle at L1.     05/21/12 CT A/P shows sclerosis at L4-5 equivocal for DJD vs mets, but likely mets per radiology    PSA Trend  05/09/10 2.0  11/07/2010 2.8  02/03/2011 2.9  08/18/2011 3.5  01/19/2012 4.8  02/15/2012 4.5  04/19/2012 5.4  05/23/2012 6.1  06/27/2012 3.0 (started lupron)  09/26/2012 0.1 (received lupron)  12/26/2012 0.2 (received lupron)  03/20/2013 0.3   07/24/2012 2.4  11/06/2013 6.9 (starting casodex)       Prostate cancer metastatic to bone (CMS-HCC)   09/26/2012 Initial Diagnosis    Prostate cancer metastatic to bone (CMS-HCC)     08/08/2018 - 08/08/2018 Endocrine/Hormone Therapy    OP LEUPROLIDE (ELIGARD) 45 MG EVERY 6 MONTHS  Plan Provider: Chrisandra Netters, MD     08/08/2018 Endocrine/Hormone Therapy    OP LEUPROLIDE (22.5 MG EVERY 3 MONTHS)  Plan Provider: Chrisandra Netters, MD         Patient Active Problem List   Diagnosis   ??? 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 Lethargy   ??? Chronic bilateral low back pain   ??? Essential hypertension   ??? Allergic rhinitis   ??? Uncomplicated asthma   ??? Lumbar radiculopathy   ??? Acute left ankle pain   ??? Hot flashes   ??? Infection and inflammatory reaction due to implanted penile prosthesis, initial encounter (CMS-HCC)   ??? Back pain   ??? Chronic ankle pain   ??? Nocturia   ??? Acute respiratory distress   ??? Acute respiratory failure with hypoxia (CMS-HCC)   ??? Anemia   ??? Colon cancer (CMS-HCC)   ??? Constipation due to opioid therapy   ??? Current every day smoker   ??? High cholesterol   ??? Hyponatremia   ??? Impotence of organic origin   ??? Leukocytosis   ??? LLL pneumonia   ??? Luetscher's syndrome   ??? Malignant neoplasm of prostate (CMS-HCC)   ??? Opioid dependence (CMS-HCC)   ??? Pneumonia   ??? Rotator cuff arthropathy, left   ??? Shoulder arthritis       Past Medical History:   Diagnosis Date   ??? Acid reflux    ??? Hyperlipemia    ??? Hypertension    ??? Prostate cancer (CMS-HCC)     bone metastasis       Past Surgical History:   Procedure Laterality Date   ??? PR INSERT,INFLATABLE PENILE PROSTHESIS N/A 03/16/2017    Procedure: INSERTION MULTI-COMPONENT INFLATABLE PENILE PROSTHESIS INCLUDING PLACEMENT OF PUMP, CYLINDERS & RESERVOIR;  Surgeon: Turner Daniels, MD;  Location: [facility] OR [facility];  Service: Urology   ??? PR LAMINEC/FACETECT/FORAMIN,EACH ADDNL N/A 01/31/2016    Procedure:  LAMINECTMY 1 SEGMT-UNI/BIL; EA ADD CERV/THOR/LUM X4;  Surgeon: Timothy Lasso, MD;  Location: [facility] OR [facility];  Service: Orthopedics   ??? PR LAMINEC/FACETECT/FORAMIN,LUMBAR 1 SEG N/A 01/31/2016    Procedure: LAMINECTOMY, FACETECTOMY & FORAMINOTOMY(UNI/BILAT W/DECOMP SPINAL CORD), SINGLE VERTEBRAL SEGMENT; LUMBAR;  Surgeon: Timothy Lasso, MD;  Location[facility] OR [facility];  Service: Orthopedics   ??? PR REMVL,INFLAT PENILE PROSTH W/O REPLACMT N/A 04/20/2017    Procedure: REMOVAL OF ALL COMPONENTS OF A MULTI-COMPONENT, INFLATABLE PENILE PROSTH WO REPLACEMENT OF PROSTHESIS;  Surgeon: Turner Daniels, MD;  Location: [facility] OR [facility];  Service: Urology       Current Outpatient Medications   Medication Sig Dispense Refill   ??? enzalutamide (XTANDI) 40 mg capsule Take 4 capsules (160 mg) by mouth daily. 120 capsule 11   ??? ezetimibe (ZETIA) 10 mg tablet Take 10 mg by mouth daily. Frequency:QD   Dosage:10   MG  Instructions:  Note:Dose: 10MG      ??? gabapentin (NEURONTIN) 600 MG tablet Take 600 mg by mouth Two (2) times a day.     ??? hydroCHLOROthiazide (HYDRODIURIL) 25 MG tablet TAKE 1 TABLET BY MOUTH ONCE DAILY. 30 tablet 11   ??? lisinopril (PRINIVIL,ZESTRIL) 40 MG tablet Take 40 mg by mouth daily.      ??? mirabegron (MYRBETRIQ) 25 mg Tb24 extended-release tablet Take 1 tablet (25 mg total) by mouth daily. 30 tablet 6   ??? omeprazole (PRILOSEC) 20 MG capsule Take 20 mg by mouth daily.     ??? oxybutynin (DITROPAN) 5 MG tablet Take by mouth. 5 mg qAM and 15 mg qPM  0   ??? pregabalin (LYRICA) 150 MG capsule Take 1 capsule (150 mg total) by mouth three (3) times a day (at 6am, noon and 6pm). 90 capsule 1   ??? tamsulosin (FLOMAX) 0.4 mg capsule Take 1 capsule (0.4 mg total) by mouth daily. 30 capsule 11   ??? traMADol (ULTRAM) 50 mg tablet Take 50 mg by mouth every six (6) hours as needed for pain.       No current facility-administered medications for this visit.       Allergies:   Allergies   Allergen Reactions   ??? Poison [employer]- Rash per patient       Family History:  Cancer-related family history is not on file.  He indicated that the status of his mother is unknown. He indicated that the status of his father is unknown.      REVIEW OF SYSTEMS:  A comprehensive review of 10 systems was negative except for pertinent positives noted in HPI.      PHYSICAL EXAM:   Vital signs for this encounter: VS reviewed in EPIC.  GEN: Awake and alert, pleasant appearing male in no acute distress  PSYCH: Alert and oriented to person, place and time. Euthymic.  HEENT: Pupils equally round without scleral icterus. No facial asymmetry.  CV: Regular rhythm with normal rate, no murmurs.  LUNGS: No increased work of breathing.  ABD: Soft and non-tender with no distention  SKIN: No rashes, petechiae or jaundice noted  EXT: No edema noted of the lower extremities  NEURO: Normal gait and coordination.    Lab Results   Component Value Date    CREATININE 1.70 (H) 09/18/2019     Lab Results   Component Value Date    ALKPHOS 111 09/18/2019    BILITOT 0.3 09/18/2019    BILIDIR <0.10 04/25/2018  PROT 7.3 09/18/2019    ALBUMIN 4.3 09/18/2019    ALT 13 09/18/2019    AST 23 09/18/2019            I personally spent 35 minutes face-to-face and non-face-to-face in the care of this patient, which includes all pre, intra, and post visit time on the date of service.     Kathlynn Grate, MD  [facility] Outpatient Oncology Palliative Care

## 2019-12-25 NOTE — Unmapped (Signed)
Rfv:  lower urianry tract symptoms      A&P:    1. Lower urinary tract symptoms: We discussed options for management. His sx are stable/better and most bothersome is nocturia      ?? Bladder retraining: Timed voiding every 2 hours while awake. Hold the urge as long as he can  ?? Decrease nighttime fluid intake and stop all fluids at least one hour before bed  ?? Void right before lying down  ?? Improve sleep hygiene for more restorative sleep. Tips given  ?? He is not interested in PFPT at this time  ?? Take note of any triggers and let me know  ?? Continue tamsulosin, oxybutynin, and add mirabegron     Follow up next time he is here to see Dr Vernell Barrier; can cancel if he is doing well      [name], NP-C  Adult Nurse Practitioner  Urology & Medical Oncology          HPI:    Calvin Obrien is a very nice 69 y.o. man who is being treated for advanced prostate cancer by Dr Vernell Barrier. Current treatment is ADT+ enzalutamide    He notes some bothersome urinary symptoms.  For about 6 months, he says he has had increased urgency with UUI. Says he just cant hold it when he gets the urge  Wears 2 pads/day; not soaked  He usually sleeps until about 0300, then gets up 3 times to void before he's up for the day  He is taking 3 pills before bed to help with the nocturia, not sure the name of it. He is not sure if he is taking tamsulosin  His med list includes tamsulosin and oxybutynin, both of which were written here in 2018.   He drinks 5-6 glasses of water every day, an occasional Sprite, and a can of beer a few days per week  He denies hematuria, dysuria, retention, or straining  Has a daily soft bowel movement    Interval 12/25/2019    Brought his pill bottles: Taking mirabegron, oxybutynin, flomax  nocturia x 3, falls back to sleep. This is the most bothersome symptoms for him. He doesn't have great sleep hygiene. Sometimes drinks beer. Watches TV for 2 hours in the bed.  No new symptoms  Bowel habits normal      Allergies Allergen Reactions   ??? Poison [employer]- Rash per patient       Prior to Admission medications    Medication Sig Start Date End Date Taking? Authorizing Provider   aspirin (ECOTRIN) 81 MG tablet Take 81 mg by mouth daily.    Historical Provider, MD   azithromycin (ZITHROMAX) 250 MG tablet Take 500 mg by mouth daily. For 4 days 04/22/18   Historical Provider, MD   benzonatate (TESSALON) 100 MG capsule Take 200 mg by mouth Three (3) times a day as needed. 04/22/18   Historical Provider, MD   enzalutamide [name]) 40 mg capsule Take 4 capsules (160 mg) by mouth daily. 03/18/19   Chrisandra Netters, MD   ezetimibe (ZETIA) 10 mg tablet Take 10 mg by mouth daily. Frequency:QD   Dosage:10   MG  Instructions:  Note:Dose: 10MG  08/18/11   Historical Provider, MD   gabapentin (NEURONTIN) 600 MG tablet Take 600 mg by mouth Two (2) times a day. 03/20/18   Historical Provider, MD   hydroCHLOROthiazide (HYDRODIURIL) 25 MG tablet Take 1 tablet (25 mg total)  by mouth daily. 08/22/18   Daylene Posey, CPP   lisinopril (PRINIVIL,ZESTRIL) 40 MG tablet Take 40 mg by mouth daily.  02/28/16   Historical Provider, MD   methylphenidate HCl (RITALIN) 10 MG tablet Take 1 tablet (10 mg total) by mouth daily. 08/08/18   Chrisandra Netters, MD   mirtazapine (REMERON) 7.5 MG tablet Take 1 tablet (7.5 mg total) by mouth nightly. 09/09/18   Daylene Posey, CPP   omeprazole (PRILOSEC) 20 MG capsule Take 20 mg by mouth daily.    Historical Provider, MD   oxybutynin (DITROPAN) 5 MG tablet Take 1 tablet (5 mg total) by mouth Three (3) times a day. 03/08/17   Valora Corporal, MD   oxyCODONE (ROXICODONE) 5 MG immediate release tablet Take 1 tablet (5 mg total) by mouth every four (4) hours as needed for pain. for up to 10 doses 05/24/17   Turner Daniels, MD   pregabalin (LYRICA) 150 MG capsule Take 1 capsule (150 mg total) by mouth three (3) times a day (at 6am, noon and 6pm). 07/20/16   Lorelee Market, FNP tamsulosin (FLOMAX) 0.4 mg capsule Take 1 capsule (0.4 mg total) by mouth daily. 03/08/17   Valora Corporal, MD   traMADol (ULTRAM) 50 mg tablet Take 50 mg by mouth every six (6) hours as needed for pain.    Historical Provider, MD   triamcinolone (KENALOG) 0.1 % cream daily. 04/15/18   Historical Provider, MD   VENTOLIN HFA 90 mcg/actuation inhaler Inhale 1 puff every four (4) hours as needed.  03/16/15   Historical Provider, MD     Past Medical History:   Diagnosis Date   ??? Acid reflux    ??? Hyperlipemia    ??? Hypertension    ??? Prostate cancer (CMS-HCC)     bone metastasis       GENERAL: Pleasant, NAD.   VS: Reviewed and unremarkable  Head: normocephalic  Eyes: sclera aniecteric  Pulm: Non labored WOB  Skin: Warm, dry  Msk: No Le edema  NEURO: A&O x 3      Data:  No labs or imaging for me today

## 2019-12-25 NOTE — Unmapped (Signed)
Pt received lupron 22.5mg  injection in right ventroGluteus. Tolerated it well but gets very nervous. Applied guaze and band aid.

## 2019-12-26 NOTE — Unmapped (Signed)
Calvin Obrien Clinical Assessment & Refill Coordination Note    Calvin Obrien, DOB: 1951-01-22  Phone: [phone] (home)     All above HIPAA information was verified with patient.     Was a Nurse, learning disability used for this call? No    Specialty Medication(s):   Hematology/Oncology: [name]     Current Outpatient Medications   Medication Sig Dispense Refill   ??? enzalutamide (XTANDI) 40 mg capsule Take 4 capsules (160 mg) by mouth daily. 120 capsule 11   ??? ezetimibe (ZETIA) 10 mg tablet Take 10 mg by mouth daily. Frequency:QD   Dosage:10   MG  Instructions:  Note:Dose: 10MG      ??? gabapentin (NEURONTIN) 600 MG tablet Take 600 mg by mouth Two (2) times a day.     ??? hydroCHLOROthiazide (HYDRODIURIL) 25 MG tablet TAKE 1 TABLET BY MOUTH ONCE DAILY. 30 tablet 11   ??? lisinopril (PRINIVIL,ZESTRIL) 40 MG tablet Take 40 mg by mouth daily.      ??? mirabegron (MYRBETRIQ) 25 mg Tb24 extended-release tablet Take 1 tablet (25 mg total) by mouth daily. 30 tablet 6   ??? omeprazole (PRILOSEC) 20 MG capsule Take 20 mg by mouth daily.     ??? oxybutynin (DITROPAN) 5 MG tablet Take by mouth. 5 mg qAM and 15 mg qPM  0   ??? pregabalin (LYRICA) 150 MG capsule Take 1 capsule (150 mg total) by mouth three (3) times a day (at 6am, noon and 6pm). 90 capsule 1   ??? tamsulosin (FLOMAX) 0.4 mg capsule Take 1 capsule (0.4 mg total) by mouth daily. 30 capsule 11   ??? traMADol (ULTRAM) 50 mg tablet Take 50 mg by mouth every six (6) hours as needed for pain.       No current facility-administered medications for this visit.        Changes to medications: [name] reports no changes at this time.    Allergies   Allergen Reactions   ??? Poison [employer]- Rash per patient       Changes to allergies: No    SPECIALTY MEDICATION ADHERENCE     Xtandi 40 mg: 8 days of medicine on hand     Medication Adherence    Patient reported X missed doses in the last month: 0  Specialty Medication: Xtandi 40 mg - 4 capsules once daily  Patient is on additional specialty medications: No  Informant: patient  Confirmed plan for next specialty medication refill: delivery by Obrien          Specialty medication(s) dose(s) confirmed: Regimen is correct and unchanged.     Are there any concerns with adherence? No    Adherence counseling provided? Not needed    CLINICAL MANAGEMENT AND INTERVENTION      Clinical Benefit Assessment:    Do you feel the medicine is effective or helping your condition? Yes    Clinical Benefit counseling provided? Not needed    Adverse Effects Assessment:    Are you experiencing any side effects? No    Are you experiencing difficulty administering your medicine? No    Quality of Life Assessment:    How many days over the past month did your prostate cancer  keep you from your normal activities? For example, brushing your teeth or getting up in the morning. 0    Have you discussed this with your provider? Not needed    Therapy Appropriateness:    Is therapy  appropriate? Yes, therapy is appropriate and should be continued    DISEASE/MEDICATION-SPECIFIC INFORMATION      N/A    PATIENT SPECIFIC NEEDS     - Does the patient have any physical, cognitive, or cultural barriers? No    - Is the patient high risk? Yes, patient is taking oral chemotherapy. Appropriateness of therapy as been assessed    - Does the patient require a Care Management Plan? No     - Does the patient require physician intervention or other additional services (i.e. nutrition, smoking cessation, social work)? No      SHIPPING     Specialty Medication(s) to be Shipped:   Hematology/Oncology: [name]    Other medication(s) to be shipped: No additional medications requested for fill at this time     Changes to insurance: No    Delivery Scheduled: Yes, Expected medication delivery date: 12/31/19.     Medication will be delivered via UPS to the confirmed prescription address in [facility].    The patient will receive a drug information handout for each medication shipped and additional FDA Medication Guides as required.  Verified that patient has previously received a Conservation officer, historic buildings.    All of the patient's questions and concerns have been addressed.    [name] [facility] Specialty Pharmacist

## 2019-12-30 NOTE — Unmapped (Signed)
Calvin Obrien 's Xtandi shipment will be delayed as a result of insufficient inventory of the drug.     I have reached out to the patient and communicated the delay. We will reschedule the medication for the delivery date that the patient agreed upon.  We have confirmed the delivery date as 01/01/20, via ups.

## 2019-12-31 MED FILL — XTANDI 40 MG CAPSULE: ORAL | 30 days supply | Qty: 120 | Fill #2

## 2019-12-31 MED FILL — XTANDI 40 MG CAPSULE: 30 days supply | Qty: 120 | Fill #2 | Status: AC

## 2020-01-07 DIAGNOSIS — C7951 Secondary malignant neoplasm of bone: Principal | ICD-10-CM

## 2020-01-07 DIAGNOSIS — C61 Malignant neoplasm of prostate: Principal | ICD-10-CM

## 2020-01-07 NOTE — Unmapped (Signed)
----- Message from Karie Georges, RN sent at 01/07/2020 11:48 AM EDT -----  Regarding: RE: Patient follow up  He was unsure, but was open to me submitting a referral. He will decide when they call if he wants to accept the appt. Referral placed.    Baxter Hire  ----- Message -----  From: Chrisandra Netters, MD  Sent: 01/03/2020   9:30 PM EDT  To: Belva Bertin, RN, Karie Georges, RN, #  Subject: RE: Patient follow up                            Thanks so much Boneta Lucks and Caryl Pina, would it be possible to contact the patient and offer him a referral to [facility] if he is interested?  He might refuse though.    Thanks so much All -  Enid Derry    ----- Message -----  From: Belva Bertin, RN  Sent: 12/31/2019  10:03 AM EDT  To: Chrisandra Netters, MD, #  Subject: RE: Patient follow up                            Hello Dr Vernell Barrier,     I don't see any reputable pain clinics in [facility] where he lives so probably would refer to [facility] at [facility] for now, its 1 hour from his home which is the same distance he travels to see [place] at [facility].     Another option is [place] [facility] with [facility]   (45 min from pt home, south of his residence.     Thanks,    Allegra Lai  ----- Message -----  From: Everlena Cooper, MD  Sent: 12/30/2019   3:35 PM EDT  To: Belva Bertin, RN  Subject: RE: Patient follow up                            Doristine Devoid -     Would like to be helpful to [facility] if we can. Do you have any thoughts about other clinics to refer him to?     Ronaldo Miyamoto   ----- Message -----  From: Chrisandra Netters, MD  Sent: 12/27/2019  12:32 PM EDT  To: Carlyon Shadow Dunn, ANP, #  Subject: RE: Patient follow up                            Thank you Ronaldo Miyamoto.  Is there a more appropriate clinic that you recommend for helping him manage this pain?  Thanks, Enid Derry  ----- Message -----  From: Chancy Hurter, ANP  Sent: 12/26/2019  11:55 AM EDT  To: Belva Bertin, RN, Chrisandra Netters, MD, #  Subject: RE: Patient follow up                            Thank you, Ronaldo Miyamoto  ----- Message -----  From: Everlena Cooper, MD  Sent: 12/25/2019   3:37 PM EDT  To: Carlyon Shadow Dunn, ANP, #  Subject: Patient follow up  Verlin Dike --     I saw Mr. Dills this morning.  Unfortunately, I agree that his pain is very chronic in nature and not related to his underlying cancer, and therefore use of long-term opioids is not appropriate.  We discussed various nonopioid adjuvants and nonpharmacologic interventions such as physical therapy, all of which he states he has tried or is currently on.    I did not have much more to add, and I am not sure that our clinic is the best clinic to manage his chronic back pain.  Let me know if there is something else you are hoping that we would be able to add/address.  If/when his cancer progresses to the point that it is affecting his quality of life and other ways other than his chronic back pain, we would be happy to reevaluate.Ronaldo Miyamoto

## 2020-01-16 NOTE — Unmapped (Signed)
[facility] Refill Coordination Note    Specialty Medication(s) to be Shipped:   Hematology/Oncology: Calvin Obrien    Other medication(s) to be shipped: No additional medications requested for fill at this time     Calvin Obrien, DOB: Jan 17, 1951  Phone: [phone] (home)       All above HIPAA information was verified with patient.     Was a Nurse, learning disability used for this call? No    Completed refill call assessment today to schedule patient's medication shipment from the [facility] [phone]).       Specialty medication(s) and dose(s) confirmed: Regimen is correct and unchanged.   Changes to medications: Kirklin reports no changes at this time.  Changes to insurance: No  Questions for the pharmacist: No    Confirmed patient received Welcome Packet with first shipment. The patient will receive a drug information handout for each medication shipped and additional FDA Medication Guides as required.       DISEASE/MEDICATION-SPECIFIC INFORMATION        N/A    SPECIALTY MEDICATION ADHERENCE     Medication Adherence    Patient reported X missed doses in the last month: 0  Specialty Medication: Xtandi 40mg   Patient is on additional specialty medications: No  Informant: patient                Xtandi 40 mg: 17 days of medicine on hand         SHIPPING     Shipping address confirmed in Epic.     Delivery Scheduled: Yes, Expected medication delivery date: 01/30/20.     Medication will be delivered via UPS to the prescription address in [facility] [place].    Calvin Obrien   [facility] Specialty Technician

## 2020-01-29 MED FILL — XTANDI 40 MG CAPSULE: ORAL | 30 days supply | Qty: 120 | Fill #3

## 2020-01-29 MED FILL — XTANDI 40 MG CAPSULE: 30 days supply | Qty: 120 | Fill #3 | Status: AC

## 2020-02-18 NOTE — Unmapped (Signed)
[facility] Refill Coordination Note    Specialty Medication(s) to be Shipped:   Hematology/Oncology: Calvin Obrien    Other medication(s) to be shipped: No additional medications requested for fill at this time     Calvin Obrien, DOB: Mar 31, 1951  Phone: [phone] (home)       All above HIPAA information was verified with patient.     Was a Nurse, learning disability used for this call? No    Completed refill call assessment today to schedule patient's medication shipment from the [facility] [phone]).       Specialty medication(s) and dose(s) confirmed: Regimen is correct and unchanged.   Changes to medications: [name] reports no changes at this time.  Changes to insurance: No  Questions for the pharmacist: No    Confirmed patient received Welcome Packet with first shipment. The patient will receive a drug information handout for each medication shipped and additional FDA Medication Guides as required.       DISEASE/MEDICATION-SPECIFIC INFORMATION        N/A    SPECIALTY MEDICATION ADHERENCE     Medication Adherence    Patient reported X missed doses in the last month: 0  Specialty Medication: Xtandi 40mg   Patient is on additional specialty medications: No  Informant: patient                Xtandi 40 mg: 15 days of medicine on hand          SHIPPING     Shipping address confirmed in Epic.     Delivery Scheduled: Yes, Expected medication delivery date: 03/02/20.     Medication will be delivered via UPS to the prescription address in [employer] [place].    [name]   [facility] Specialty Technician

## 2020-03-01 MED FILL — XTANDI 40 MG CAPSULE: 30 days supply | Qty: 120 | Fill #4 | Status: AC

## 2020-03-01 MED FILL — XTANDI 40 MG CAPSULE: ORAL | 30 days supply | Qty: 120 | Fill #4

## 2020-03-15 MED ORDER — MYRBETRIQ 25 MG TABLET,EXTENDED RELEASE
ORAL_TABLET | 0 refills | 0.00000 days | Status: CP
Start: 2020-03-15 — End: 2020-05-18

## 2020-03-16 NOTE — Unmapped (Signed)
Please refill if appropriate

## 2020-03-19 NOTE — Unmapped (Signed)
[facility] Refill Coordination Note    Specialty Medication(s) to be Shipped:   Hematology/Oncology: Calvin Obrien    Other medication(s) to be shipped: No additional medications requested for fill at this time     Calvin Obrien, DOB: 01/22/1951  Phone: [phone] (home)       All above HIPAA information was verified with patient.     Was a Nurse, learning disability used for this call? No    Completed refill call assessment today to schedule patient's medication shipment from the [facility] [phone]).       Specialty medication(s) and dose(s) confirmed: Regimen is correct and unchanged.   Changes to medications: [name] reports no changes at this time.  Changes to insurance: No  Questions for the pharmacist: No    Confirmed patient received Welcome Packet with first shipment. The patient will receive a drug information handout for each medication shipped and additional FDA Medication Guides as required.       DISEASE/MEDICATION-SPECIFIC INFORMATION        N/A    SPECIALTY MEDICATION ADHERENCE     Medication Adherence    Patient reported X missed doses in the last month: 0  Specialty Medication: Xtandi 40mg   Patient is on additional specialty medications: No  Informant: patient                Xtandi 40 mg: 14 days of medicine on hand         SHIPPING     Shipping address confirmed in Epic.     Delivery Scheduled: Yes, Expected medication delivery date: 03/30/20.     Medication will be delivered via UPS to the prescription address in [facility] [place].    Calvin Obrien   [facility] Specialty Technician

## 2020-03-24 ENCOUNTER — Encounter
Admit: 2020-03-24 | Discharge: 2020-04-22 | Payer: MEDICARE | Attending: Rehabilitative and Restorative Service Providers" | Primary: Rehabilitative and Restorative Service Providers"

## 2020-03-24 DIAGNOSIS — M6289 Other specified disorders of muscle: Principal | ICD-10-CM

## 2020-03-24 DIAGNOSIS — R3915 Urgency of urination: Principal | ICD-10-CM

## 2020-03-24 NOTE — Unmapped (Signed)
[facility] PT [address] [place]  OUTPATIENT PHYSICAL THERAPY  03/24/2020          Patient Name: Calvin Obrien  Date of Birth:09/06/1950  Diagnosis:   Encounter Diagnoses   Name Primary?   ??? Urinary urgency    ??? Pelvic floor weakness, male      Referring MD:  Chancy Hurter, A*     Date of Onset of Impairment-No date available  Date PT Care Plan Established or Reviewed-No date available  Date PT Treatment Started-No date available   Plan of Care Effective Date:     Assessment/Plan:    Assessment details:       Patient is a 69 yo male with metastatic prostate cancer who presents to PFPT per the recommendation of his physician in attempt to address urinary urgency and incontinence. Patient with limited insight and awareness as to why he came to this visit though by end of consult is agreeable to attempting interventions to address his symptoms. He states they began about 5 years ago and have worsened over that time. Recently, he began taking Oxybutynin which has helped to reduce the amount of times he is waking at night to void from 7-8 to 3-5. Patient feels that his bladder loss is approximately half of his bladder each time it occurs but does not have the funds to obtain pads or protective undergarments. PT provided with voiding diary using simplified instructions for completion, elevate LEs for 1 hour prior to bed and cease all fluids 2 hours prior to bed. No additional questions or concerns noted at this time with patient agreeable to return for 2-3 visits to attempt interventions for urge control and incontinence.  Impairments: urinary incontinence, urinary dysfunction, pain, poor bowel/bladder habits, poor awareness of body mechanics, decreased strength, decreased knowledge of self-care and impaired tone    Personal Factors/Comorbidities: 3+  Specific Comorbidities: colon cancer, prostate cancer, HTN, lumbar radiculopathy  Examination of Body Systems: 3 elements  Body System: musculoskeletal, NM, genitourinry  Clinical Presentation: evolving and unstable  Clinical Decision Making: complex  Prognosis: poor prognosis    Negative Prognosis Rationale: age, behavior, motivated for treatment, insight, safety awareness, home environment, financial status, Pain Status, medical status/condition, chronicity of condition, severity of symptoms and body habitus.  Barriers to therapy: understanding, hearing, vision  Therapy Goals  Goals:      Short Term Goals (within 6 weeks):   Pt will complete and return bladder diary to fully assess bladder habits and further develop goals.   Pt will demonstrate proper TA ctx in sitting and standing to improve ability to control changes in intraabdominal pressure and reduce  episode of UI   Patient independent with urge inhibition strategy to decrease urge urinary incontinence.   Pt will demonstrate understanding of normal bowel and bladder habits through verbalization to therapist.    Pt independent with HEP for self-management of sxs.   Pt report of urinary frequency WNLS (6-8 x per day; 2-4 hours between voids; denies voiding just in case; reduction in night time  voiding by 25-50%) for improved QOL      Plan  Therapy options: will be seen for skilled physical therapy services  Planned therapy interventions: Bladder Retraining, Manual Therapy, Neuromuscular Re-education, Diaphragmatic/Pursed-lip Breathing, Dry Needling, Education - Patient, Endurance Activites, Therapeutic Activities, Therapeutic Exercises, TENS, Self-Care/Home Training, Postural Training, Home Exercise Program and Functional Mobility    Frequency: 2x month  Education provided to: patient.  Education provided: Anatomy, Bladder education/retraining,  HEP, Role of therapy in Rehabilitation and Treatment options and plan  Education results: needs reinforcement.  Communication/Consultation: Initial note sent to Referring Provider.  Next visit plan:       Urge suppression, TENS  Total Session Time: 45  Treatment rendered today:       Self care/edu  Plan details:      Patient wore a mask for the entire therapy session., Therapist wore a mask for the entire session.  and Therapist wore eye goggles/frames during the entire session.         Subjective:   History of Present Illness  Date of Onset: 03/24/2010    Date of Evaluation: 03/24/2020    Reason for Referral/Chief Complaint:       Prostate cancer  Urinary urgency  Subjective:     Patient states he has had this problem for about 5 years. He states he voids 12-13 x per day. Sometimes 8x per day. He takes the medicine in am and pm 2 hours before bed and wakes 3-5x per night. Prior to the medicine it was 7-8x per night. Patient does leak urine during the day and states that he feels it is about half a bladders worth.      Pain  No pain reported  Pain Related Behaviors: none  Progression: improved      Current functional status: limited walking tolerance    Precautions and Equipment  Precautions: Cancer history  Current Braces/Orthoses: None  Equipment Currently Used: Single point cane  Social Support  Lives with: alone  Hand dominance: right  Communication Preference: verbal  Barriers to Learning: visual and hearing    Diagnostic Tests    Diagnostic Test Comments:       Reviewed in epic    Treatments  Previous treatment: medication and pain management          Objective:   Pelvic:    History of pregnancy?: No                  History of sexual abuse?: no           # of voids:  12, 13, 14, per day  # of voids per night:  Per night, 4, 5, 3  Urinary urgency?: Yes  UI frequency/leaks per day:  Per day  Urinary incontinence?: Yes  Urinary incontience types:  Mixed UI    Pain with urination/bladder filling?: No      Pad type:  doesnt have any      Fecal urgency?: No      Fecal incontinence?: No      Straining?: No    Stool Type (based on [place] Stool Chart):  VI    Pain with defecation/rectal filling: No    # of BMs per day:  0    Fluid intake:  Water: when he takes pills, 4-5 glasses; coffee: none; tea: none; juice:when he has it, orange; soda: doesnt drink every day; 1 can beer 4-5 days; milk: none  Dietary information: states he has none     Additional information:     Self-Care & Home Train x 20' Total:    Pt Ed:    The role of pelvic floor physical therapy and plan of care   Pelvic floor muscle anatomy and the role of the pelvic floor muscles in everyday function  Pt demonstrates and verbalizes understanding of the therapeutic interventions and education provided during today's session.    HEP:    Initiated 2 hour timed  voiding schedule to begin with.   ??  Educated regarding bradley's loop and bladder filling/emptying. ??  Discussed bladder health and diet with known bladder irritants.   Start and end day with water  Cease all fluids 2 hours before bed  Elevate LEs for 1 hour to bed on days LEs more swollen  Provided with bladder diary  Provided with treatment options    Patient with limitations in vision, hearing and understanding.  Instructions simplified per patient request??                                    I attest that I have reviewed the above information.  Signed: Winfield Cunas, PT  03/24/2020 10:40 AM

## 2020-03-29 MED FILL — XTANDI 40 MG CAPSULE: ORAL | 30 days supply | Qty: 120 | Fill #5

## 2020-03-29 MED FILL — XTANDI 40 MG CAPSULE: 30 days supply | Qty: 120 | Fill #5 | Status: AC

## 2020-04-13 MED ORDER — PREDNISONE 20 MG TABLET
Freq: Every day | 0 days
Start: 2020-04-13 — End: ?

## 2020-04-15 NOTE — Unmapped (Signed)
[facility] Special[facility] Pharmacy Refill Coordination Note    Specialty Medication(s) to be Shipped:   Hematology/Oncology: [name]    Other medication(s) to be shipped: No additional medications requested for fill at this time     KAYDIN KARBOWSKI, DOB: 03-28-1951  Phone: [phone] (home)       All above HIPAA information was verified with patient.     Was a Nurse, learning disability used for this call? No    Completed refill call assessment today to schedule patient's medication shipment from the [facility] [phone]).       Specialty medication(s) and dose(s) confirmed: Regimen is correct and unchanged.   Changes to medications: Ashaad reports no changes at this time.  Changes to insurance: No  Questions for the pharmacist: No    Confirmed patient received Welcome Packet with first shipment. The patient will receive a drug information handout for each medication shipped and additional FDA Medication Guides as required.       DISEASE/MEDICATION-SPECIFIC INFORMATION        N/A    SPECIALTY MEDICATION ADHERENCE     Medication Adherence    Patient reported X missed doses in the last month: 0  Specialty Medication: Xtandi 40mg   Patient is on additional specialty medications: No  Informant: patient                Xtandi 40 mg: 18 days of medicine on hand         SHIPPING     Shipping address confirmed in Epic.     Delivery Scheduled: Yes, Expected medication delivery date: 04/29/20.     Medication will be delivered via UPS to the prescription address in [employer] [place].    [name]   [facility] Specialty Technician

## 2020-04-28 MED FILL — XTANDI 40 MG CAPSULE: ORAL | 30 days supply | Qty: 120 | Fill #6

## 2020-05-03 NOTE — Unmapped (Signed)
[facility]  [address] [place], [place] [address]    [phone]    [name] did not show for his  scheduled Physical Therapy follow-up session.  Please contact me if you have any questions or concerns.     Thank you for this referral,     Signed: Winfield Cunas, PT  05/03/2020 10:01 AM

## 2020-05-10 NOTE — Unmapped (Signed)
[facility]  [address] [place], [place] [address]    [phone]    Calvin Obrien did not show for his scheduled Physical Therapy follow-up session.  Calvin Obrien has no showed on two occassions and is discharged from Physical Therapy at this time.  Please contact me if you have any questions or concerns.    Thank you for this referral,     Signed: Winfield Cunas, PT  05/10/2020 12:26 PM

## 2020-05-18 MED ORDER — MYRBETRIQ 25 MG TABLET,EXTENDED RELEASE
ORAL_TABLET | 11 refills | 0 days | Status: CP
Start: 2020-05-18 — End: ?

## 2020-05-18 MED ORDER — DULERA 200 MCG-5 MCG/ACTUATION HFA AEROSOL INHALER
Freq: Two times a day (BID) | 0 days
Start: 2020-05-18 — End: ?

## 2020-05-18 NOTE — Unmapped (Signed)
Please refill if appropriate

## 2020-05-19 NOTE — Unmapped (Signed)
[facility] Refill Coordination Note    Specialty Medication(s) to be Shipped:   Hematology/Oncology: [name]    Other medication(s) to be shipped: No additional medications requested for fill at this time     Calvin Obrien, DOB: 06-May-1950  Phone: [phone] (home)       All above HIPAA information was verified with patient.     Was a Nurse, learning disability used for this call? No    Completed refill call assessment today to schedule patient's medication shipment from the [facility] [phone]).       Specialty medication(s) and dose(s) confirmed: Regimen is correct and unchanged.   Changes to medications: [name] reports no changes at this time.  Changes to insurance: No  Questions for the pharmacist: No    Confirmed patient received Welcome Packet with first shipment. The patient will receive a drug information handout for each medication shipped and additional FDA Medication Guides as required.       DISEASE/MEDICATION-SPECIFIC INFORMATION        N/A    SPECIALTY MEDICATION ADHERENCE     Medication Adherence    Patient reported X missed doses in the last month: 0  Specialty Medication: Xtandi 40mg   Patient is on additional specialty medications: No  Informant: patient                Xtandi 40 mg: 14 days of medicine on hand          SHIPPING     Shipping address confirmed in Epic.     Delivery Scheduled: Yes, Expected medication delivery date: 05/28/20.     Medication will be delivered via UPS to the prescription address in [employer] [place].    [name]   [facility] Specialty Technician

## 2020-05-24 ENCOUNTER — Encounter: Admit: 2020-05-24 | Discharge: 2020-05-25 | Payer: MEDICARE

## 2020-05-24 ENCOUNTER — Ambulatory Visit
Admit: 2020-05-24 | Discharge: 2020-05-25 | Payer: MEDICARE | Attending: Nurse Practitioner | Primary: Nurse Practitioner

## 2020-05-24 DIAGNOSIS — C7951 Secondary malignant neoplasm of bone: Principal | ICD-10-CM

## 2020-05-24 DIAGNOSIS — C61 Malignant neoplasm of prostate: Principal | ICD-10-CM

## 2020-05-24 LAB — PSA: PROSTATE SPECIFIC ANTIGEN: <0.04 ng/mL (ref 0.00–4.00)

## 2020-05-24 MED ADMIN — leuprolide (LUPRON) injection 22.5 mg: 22.5 mg | INTRAMUSCULAR | @ 14:00:00 | Stop: 2020-05-24

## 2020-05-24 NOTE — Unmapped (Signed)
Rfv:  mCRPC and lUTS      A&P:    1. Lower urinary tract symptoms:      ?? Bladder retraining: Timed voiding every 2 hours while awake. Hold the urge as long as he can  ?? Decrease nighttime fluid intake and stop all fluids at least one hour before bed  ?? Void right before lying down  ?? Improve sleep hygiene for more restorative sleep. Tips given  ?? He is not interested in PFPT at this time  ?? Continue tamsulosin, oxybutynin, and add mirabegron     2. mCRPC    ?? Eligard 22.5 mg given today 05/24/2020, due again on after 08/22/2020  ?? Continue enzalutamide  ?? Excellent PSA response  ?? Calcium and vitamin D      Follow up:  -3 months: Labs, Dr Vernell Barrier, leuprolide          [name], NP-C  Adult Nurse Practitioner  Urology & Medical Oncology      Oncology history:   - Diagnosed 2008 with prostate cancer, PSA 13.5.   - Prostate biopsy on 04/02/07 w Gleason 3+4=7   - 2009 Radiation therapy to w Dr. Nonie Hoyer 78 Gy.   - 2012-13: Rising PSA   - 11/13: Saw [facility], felt not candidate for salvage RT   - 2/14: BS c/w 2009 with suspicious increased uptake at T5, L inferior iliac bone, subtle at L1.   - CT A/P shows sclerosis at L4-5 equivocal for DJD vs metastases, I discussed w radiology, felt most likely metastatic.   - 3/14: Lupron started   - 12/14: Lupron held for intermittent approach  - 11/13/13: Restarted Lupron due to PSA rise with worsening back pain  - 8/15: Palliative RT to spine in [place], [place].  - 05/25/14:  MRI spine: Multilevel degenerative changes, most prominent at L3-4 and L4-L5, where there is marked spinal canal stenosis and crowding with potential indentation of the nerve roots. Severe bilateral neural foraminal narrowing at L4-5 and moderate to marked neuroforaminal narrowing at L3-4 on the right. Additional multilevel degenerative changes as outlined above.    - 08/15/15: Abnormal focal marrow signal and enhancement at the endplates surrounding the L4-5 disc space are favored to represent acutely inflamed Schmorl's nodes. Metastatic disease is felt less likely but is not entirely excluded, particularly given enlargement of the lesion at the L5 level. Continued follow-up recommended.  - 6/17: Bone mineral density: The femoral neck density is mildly low, but the spine and total femoral densities are normal.  - 10/17: L2-S1 decompression surgery w Dr. 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  - 12/17: L ankle pain, negative xray.  - 04/20/18: CT A/P: Stable heterogeneity of the left iliac wing representing known metastatic disease. No new evidence of metastatic disease within the abdomen/pelvis.  - 04/25/18: Bone scan: Increased radiotracer activity within the left iliac bone concerning for osseous metastases.  - 08/08/18: Castration resistant by PSA - start process for enzalutamide      HPI:    Calvin Obrien is a very nice 70 y.o. man who is being treated for advanced prostate cancer by Dr Vernell Barrier. Current treatment is ADT+ enzalutamide    He notes some bothersome urinary symptoms.  For about 6 months, he says he has had increased urgency with UUI. Says he just cant hold it when he gets the urge  Wears 2 pads/day; not soaked  He usually sleeps until about 0300,  then gets up 3 times to void before he's up for the day  He is taking 3 pills before bed to help with the nocturia, not sure the name of it. He is not sure if he is taking tamsulosin  His med list includes tamsulosin and oxybutynin, both of which were written here in 2018.   He drinks 5-6 glasses of water every day, an occasional Sprite, and a can of beer a few days per week  He denies hematuria, dysuria, retention, or straining  Has a daily soft bowel movement    Interval 12/25/2019    Brought his pill bottles: Taking mirabegron, oxybutynin, flomax  nocturia x 3, falls back to sleep. This is the most bothersome symptoms for him. He doesn't have great sleep hygiene. Sometimes drinks beer. Watches TV for 2 hours in the bed.  No new symptoms  Bowel habits normal    Interval 05/24/2020    He has been doing well  Urinary symptoms are improved  Taking enza as prescribed  No bone pain, hematuria, weight loss      Allergies   Allergen Reactions   ??? Poison [employer]- Rash per patient       Prior to Admission medications    Medication Sig Start Date End Date Taking? Authorizing Provider   aspirin (ECOTRIN) 81 MG tablet Take 81 mg by mouth daily.    Historical Provider, MD   azithromycin (ZITHROMAX) 250 MG tablet Take 500 mg by mouth daily. For 4 days 04/22/18   Historical Provider, MD   benzonatate (TESSALON) 100 MG capsule Take 200 mg by mouth Three (3) times a day as needed. 04/22/18   Historical Provider, MD   enzalutamide [name]) 40 mg capsule Take 4 capsules (160 mg) by mouth daily. 03/18/19   Chrisandra Netters, MD   ezetimibe (ZETIA) 10 mg tablet Take 10 mg by mouth daily. Frequency:QD   Dosage:10   MG  Instructions:  Note:Dose: 10MG  08/18/11   Historical Provider, MD   gabapentin (NEURONTIN) 600 MG tablet Take 600 mg by mouth Two (2) times a day. 03/20/18   Historical Provider, MD   hydroCHLOROthiazide (HYDRODIURIL) 25 MG tablet Take 1 tablet (25 mg total) by mouth daily. 08/22/18   Daylene Posey, CPP   lisinopril (PRINIVIL,ZESTRIL) 40 MG tablet Take 40 mg by mouth daily.  02/28/16   Historical Provider, MD   methylphenidate HCl (RITALIN) 10 MG tablet Take 1 tablet (10 mg total) by mouth daily. 08/08/18   Chrisandra Netters, MD   mirtazapine (REMERON) 7.5 MG tablet Take 1 tablet (7.5 mg total) by mouth nightly. 09/09/18   Daylene Posey, CPP   omeprazole (PRILOSEC) 20 MG capsule Take 20 mg by mouth daily.    Historical Provider, MD   oxybutynin (DITROPAN) 5 MG tablet Take 1 tablet (5 mg total) by mouth Three (3) times a day. 03/08/17   Valora Corporal, MD   oxyCODONE (ROXICODONE) 5 MG immediate release tablet Take 1 tablet (5 mg total) by mouth every four (4) hours as needed for pain. for up to 10 doses 05/24/17   Turner Daniels, MD   pregabalin (LYRICA) 150 MG capsule Take 1 capsule (150 mg total) by mouth three (3) times a day (at 6am, noon and 6pm). 07/20/16   Lorelee Market, FNP   tamsulosin (FLOMAX) 0.4 mg capsule Take 1 capsule (0.4 mg total) by mouth daily. 03/08/17   Valora Corporal, MD  traMADol (ULTRAM) 50 mg tablet Take 50 mg by mouth every six (6) hours as needed for pain.    Historical Provider, MD   triamcinolone (KENALOG) 0.1 % cream daily. 04/15/18   Historical Provider, MD   VENTOLIN HFA 90 mcg/actuation inhaler Inhale 1 puff every four (4) hours as needed.  03/16/15   Historical Provider, MD     Past Medical History:   Diagnosis Date   ??? Acid reflux    ??? Hyperlipemia    ??? Hypertension    ??? Prostate cancer (CMS-HCC)     bone metastasis       GENERAL: Pleasant, NAD.   VS: Reviewed and unremarkable  Head: normocephalic  Eyes: sclera aniecteric  Pulm: Non labored WOB  Skin: Warm, dry  Msk: No Le edema  NEURO: A&O x 3      Data:    Lab Results   Component Value Date    PSA <0.04 05/24/2020    PSA <0.04 12/25/2019    PSA <0.10 09/18/2019    PSA <0.10 06/12/2019    PSA 0.11 03/03/2019    PSA <0.10 11/28/2018

## 2020-05-24 NOTE — Unmapped (Signed)
??   Keep taking the cancer medication  ?? Come back in 3 months  ?? Keep taking your calcium and vitamin D  ?? Shot today, again in 3 months    [name], NP-C, OCN  Adult Nurse Practitioner  Urology and Medical Oncology    Notice: Starting July 14, 2019, many test results will be automatically released into MyChart. This may happen before your provider has a chance to review them. Your results will either be reviewed with you at your scheduled appointment or your provider or someone from their office will reach out to you to discuss the results. Thank you for your understanding during this transition.     If you have been prescribed a medication today, always read the package insert that comes with the medication.    In the event of an emergency, always call 911    Urology:  Main clinic & [place]: [phone]  Fax: [phone]    Cancer Hospital:  Phone: [phone]  Fax: [phone]    After hours/nights/weekends:  Hospital Operator: [phone]    [email or web address]  [email or web address]

## 2020-05-24 NOTE — Unmapped (Unsigned)
Labs drawn peripherally by Marko Stai.

## 2020-05-24 NOTE — Unmapped (Signed)
Pt received lupron 22.5mg  injection in Left ventroGluteus. Tolerated it well. Applied guaze and band aid. Marland Kitchen

## 2020-05-27 MED FILL — XTANDI 40 MG CAPSULE: ORAL | 30 days supply | Qty: 120 | Fill #7

## 2020-06-17 NOTE — Unmapped (Signed)
[facility] Clinical Assessment & Refill Coordination Note    Calvin Obrien, DOB: 10-May-1950  Phone: [phone] (home)     All above HIPAA information was verified with patient.     Was a Nurse, learning disability used for this call? No    Specialty Medication(s):   Hematology/Oncology: [name]     Current Outpatient Medications   Medication Sig Dispense Refill   ??? DULERA 200-5 mcg/actuation HFAA 20 mcg Two (2) times a day.     ??? enzalutamide (XTANDI) 40 mg capsule Take 4 capsules (160 mg) by mouth daily. 120 capsule 11   ??? ezetimibe (ZETIA) 10 mg tablet Take 10 mg by mouth daily. Frequency:QD   Dosage:10   MG  Instructions:  Note:Dose: 10MG      ??? gabapentin (NEURONTIN) 600 MG tablet Take 600 mg by mouth Two (2) times a day.     ??? hydroCHLOROthiazide (HYDRODIURIL) 25 MG tablet TAKE 1 TABLET BY MOUTH ONCE DAILY. 30 tablet 11   ??? lisinopril (PRINIVIL,ZESTRIL) 40 MG tablet Take 40 mg by mouth daily.      ??? MYRBETRIQ 25 mg Tb24 extended-release tablet TAKE 1 TABLET BY MOUTH ONCE DAILY. 30 tablet 11   ??? omeprazole (PRILOSEC) 20 MG capsule Take 20 mg by mouth daily.     ??? oxybutynin (DITROPAN) 5 MG tablet Take by mouth. 5 mg qAM and 15 mg qPM  0   ??? predniSONE (DELTASONE) 20 MG tablet 20 mg daily.     ??? pregabalin (LYRICA) 150 MG capsule Take 1 capsule (150 mg total) by mouth three (3) times a day (at 6am, noon and 6pm). 90 capsule 1   ??? tamsulosin (FLOMAX) 0.4 mg capsule Take 1 capsule (0.4 mg total) by mouth daily. 30 capsule 11   ??? traMADol (ULTRAM) 50 mg tablet Take 50 mg by mouth every six (6) hours as needed for pain.       No current facility-administered medications for this visit.        Changes to medications: Gerik reports no changes at this time.    Allergies   Allergen Reactions   ??? Poison [employer]- Rash per patient       Changes to allergies: No    SPECIALTY MEDICATION ADHERENCE     Xtandi 40 mg: 14 days of medicine on hand       Medication Adherence    Patient reported X missed doses in the last month: 0  Specialty Medication: Xtandi          Specialty medication(s) dose(s) confirmed: Regimen is correct and unchanged.     Are there any concerns with adherence? No    Adherence counseling provided? Not needed    CLINICAL MANAGEMENT AND INTERVENTION      Clinical Benefit Assessment:    Do you feel the medicine is effective or helping your condition? Patient declined to answer    Clinical Benefit counseling provided? Not needed    Adverse Effects Assessment:    Are you experiencing any side effects? No    Are you experiencing difficulty administering your medicine? No    Quality of Life Assessment:    How many days over the past month did your prostate cancer  keep you from your normal activities? For example, brushing your teeth or getting up in the morning. Patient declined to answer    Have you discussed this with your provider? Not needed    Therapy Appropriateness:  Is therapy appropriate? Yes, therapy is appropriate and should be continued    DISEASE/MEDICATION-SPECIFIC INFORMATION      N/A    PATIENT SPECIFIC NEEDS     - Does the patient have any physical, cognitive, or cultural barriers? No    - Is the patient high risk? Yes, patient is taking oral chemotherapy. Appropriateness of therapy as been assessed    - Does the patient require a Care Management Plan? No     - Does the patient require physician intervention or other additional services (i.e. nutrition, smoking cessation, social work)? No      SHIPPING     Specialty Medication(s) to be Shipped:   Hematology/Oncology: [name]    Other medication(s) to be shipped: No additional medications requested for fill at this time     Changes to insurance: No    Delivery Scheduled: Yes, Expected medication delivery date: 3/24.     Medication will be delivered via UPS to the confirmed prescription address in [name].    The patient will receive a drug information handout for each medication shipped and additional FDA Medication Guides as required.  Verified that patient has previously received a Conservation officer, historic buildings.    All of the patient's questions and concerns have been addressed.    Clydell Hakim   [facility] Specialty Pharmacist

## 2020-06-23 MED FILL — XTANDI 40 MG CAPSULE: ORAL | 30 days supply | Qty: 120 | Fill #8

## 2020-07-15 NOTE — Unmapped (Signed)
[facility] Refill Coordination Note    Specialty Medication(s) to be Shipped:   Hematology/Oncology: Calvin Obrien    Other medication(s) to be shipped: No additional medications requested for fill at this time     Calvin Obrien, DOB: 30-Jul-1950  Phone: [phone] (home)       All above HIPAA information was verified with patient.     Was a Nurse, learning disability used for this call? No    Completed refill call assessment today to schedule patient's medication shipment from the [facility] [phone]).  All relevant notes have been reviewed.     Specialty medication(s) and dose(s) confirmed: Regimen is correct and unchanged.   Changes to medications: Irby reports no changes at this time.  Changes to insurance: No  New side effects reported not previously addressed with a pharmacist or physician: None reported  Questions for the pharmacist: No    Confirmed patient received a Conservation officer, historic buildings and a Surveyor, mining with first shipment. The patient will receive a drug information handout for each medication shipped and additional FDA Medication Guides as required.       DISEASE/MEDICATION-SPECIFIC INFORMATION        N/A    SPECIALTY MEDICATION ADHERENCE     Medication Adherence    Patient reported X missed doses in the last month: 0  Specialty Medication: Xtandi 40 mg  Patient is on additional specialty medications: No  Informant: patient              Were doses missed due to medication being on hold? No    Xtandi 40 mg: 14 days of medicine on hand       REFERRAL TO PHARMACIST     Referral to the pharmacist: Not needed      [facility]     Shipping address confirmed in Epic.     Delivery Scheduled: Yes, Expected medication delivery date: 07/28/20.     Medication will be delivered via UPS to the prescription address in [employer] [place].    [name]   [facility] Specialty Technician

## 2020-07-27 MED FILL — XTANDI 40 MG CAPSULE: ORAL | 30 days supply | Qty: 120 | Fill #9

## 2020-08-10 ENCOUNTER — Other Ambulatory Visit (HOSPITAL_COMMUNITY): Payer: Self-pay | Admitting: Internal Medicine

## 2020-08-10 ENCOUNTER — Other Ambulatory Visit: Payer: Self-pay | Admitting: Internal Medicine

## 2020-08-10 DIAGNOSIS — Z136 Encounter for screening for cardiovascular disorders: Secondary | ICD-10-CM

## 2020-08-10 DIAGNOSIS — I10 Essential (primary) hypertension: Principal | ICD-10-CM

## 2020-08-10 MED ORDER — HYDROCHLOROTHIAZIDE 25 MG TABLET
ORAL_TABLET | 11 refills | 0 days
Start: 2020-08-10 — End: ?

## 2020-08-10 MED ORDER — OXYCODONE 5 MG TABLET
Freq: Every day | 0 days
Start: 2020-08-10 — End: ?

## 2020-08-10 NOTE — Unmapped (Signed)
Please refill if appropriate

## 2020-08-12 DIAGNOSIS — I10 Essential (primary) hypertension: Principal | ICD-10-CM

## 2020-08-12 MED ORDER — HYDROCHLOROTHIAZIDE 25 MG TABLET
ORAL_TABLET | Freq: Every day | ORAL | 11 refills | 0.00000 days | Status: CP
Start: 2020-08-12 — End: ?

## 2020-08-26 ENCOUNTER — Encounter: Admit: 2020-08-26 | Discharge: 2020-08-27 | Payer: MEDICARE

## 2020-08-26 ENCOUNTER — Encounter: Admit: 2020-08-26 | Discharge: 2020-08-27 | Payer: MEDICARE | Attending: Medical Oncology | Primary: Medical Oncology

## 2020-08-26 DIAGNOSIS — C61 Malignant neoplasm of prostate: Principal | ICD-10-CM

## 2020-08-26 DIAGNOSIS — C7951 Secondary malignant neoplasm of bone: Principal | ICD-10-CM

## 2020-08-26 DIAGNOSIS — M898X9 Other specified disorders of bone, unspecified site: Principal | ICD-10-CM

## 2020-08-26 DIAGNOSIS — T451X5A Adverse effect of antineoplastic and immunosuppressive drugs, initial encounter: Principal | ICD-10-CM

## 2020-08-26 LAB — COMPREHENSIVE METABOLIC PANEL
ALBUMIN: 3.7 g/dL (ref 3.4–5.0)
ALKALINE PHOSPHATASE: 124 U/L — ABNORMAL HIGH (ref 46–116)
ALT (SGPT): 14 U/L (ref 10–49)
ANION GAP: 3 mmol/L — ABNORMAL LOW (ref 5–14)
AST (SGOT): 19 U/L (ref <=34)
BILIRUBIN TOTAL: 0.5 mg/dL (ref 0.3–1.2)
BLOOD UREA NITROGEN: 26 mg/dL — ABNORMAL HIGH (ref 9–23)
BUN / CREAT RATIO: 20
CALCIUM: 9.5 mg/dL (ref 8.7–10.4)
CHLORIDE: 102 mmol/L (ref 98–107)
CO2: 30 mmol/L (ref 20.0–31.0)
CREATININE: 1.32 mg/dL — ABNORMAL HIGH
EGFR CKD-EPI (2021) MALE: 58 mL/min/{1.73_m2} — ABNORMAL LOW (ref >=60)
GLUCOSE RANDOM: 101 mg/dL (ref 70–179)
POTASSIUM: 4.1 mmol/L (ref 3.5–5.1)
PROTEIN TOTAL: 7 g/dL (ref 5.7–8.2)
SODIUM: 135 mmol/L (ref 135–145)

## 2020-08-26 LAB — CBC W/ AUTO DIFF
BASOPHILS ABSOLUTE COUNT: 0.1 10*9/L (ref 0.0–0.1)
BASOPHILS RELATIVE PERCENT: 0.7 %
EOSINOPHILS ABSOLUTE COUNT: 0.3 10*9/L (ref 0.0–0.5)
EOSINOPHILS RELATIVE PERCENT: 3.4 %
HEMATOCRIT: 38.7 % — ABNORMAL LOW (ref 39.0–48.0)
HEMOGLOBIN: 13.4 g/dL (ref 12.9–16.5)
LYMPHOCYTES ABSOLUTE COUNT: 2.3 10*9/L (ref 1.1–3.6)
LYMPHOCYTES RELATIVE PERCENT: 25.3 %
MEAN CORPUSCULAR HEMOGLOBIN CONC: 34.6 g/dL (ref 32.0–36.0)
MEAN CORPUSCULAR HEMOGLOBIN: 31.5 pg (ref 25.9–32.4)
MEAN CORPUSCULAR VOLUME: 91.1 fL (ref 77.6–95.7)
MEAN PLATELET VOLUME: 10 fL (ref 6.8–10.7)
MONOCYTES ABSOLUTE COUNT: 0.7 10*9/L (ref 0.3–0.8)
MONOCYTES RELATIVE PERCENT: 7.5 %
NEUTROPHILS ABSOLUTE COUNT: 5.7 10*9/L (ref 1.8–7.8)
NEUTROPHILS RELATIVE PERCENT: 63.1 %
PLATELET COUNT: 208 10*9/L (ref 150–450)
RED BLOOD CELL COUNT: 4.25 10*12/L — ABNORMAL LOW (ref 4.26–5.60)
RED CELL DISTRIBUTION WIDTH: 13.3 % (ref 12.2–15.2)
WBC ADJUSTED: 9 10*9/L (ref 3.6–11.2)

## 2020-08-26 LAB — PSA: PROSTATE SPECIFIC ANTIGEN: <0.04 ng/mL (ref 0.00–4.00)

## 2020-08-26 MED ADMIN — leuprolide (LUPRON) injection 22.5 mg: 22.5 mg | INTRAMUSCULAR | @ 15:00:00 | Stop: 2020-08-26

## 2020-08-26 NOTE — Unmapped (Signed)
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  After adding enzalutamide, PSA dropped to <0.10 on 11/28/18, 0.11 on 03/03/19, <0.10 on 06/12/19, <0.10 on 09/18/19, <0.04 on 12/25/19.    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  He is followed in a pain clinic and has seen Palliative Care.    He is feeling very well with good energy.    Based on PSA values and how he is feeling, we will continue current therapy with Lupron and enzalutamide.    Lab Results   Component Value Date    PSA <0.04 05/24/2020    PSA <0.04 12/25/2019    PSA <0.10 09/18/2019    PSA <0.10 06/12/2019    PSA 0.11 03/03/2019    PSA <0.10 11/28/2018       Plan:   - Continue 22-month Lupron 22.5mg  (08/26/20).  (I tried to change to 8-month injection to reduce exposure to health system due to coronavirus but insurance refused this apparently.)  - Continue enzalutamide.  - Follow up PSA.  - He will follow up with his pain clinic and with palliative Care here as warranted.  - He will meet about gas money today to help him out.  - Urinary leakage: He has seen Barkley Bruns, NP for evaluation.  - Recheck BMD.  - Refer to Urology for erectile dysfunction discussion.  ----------------------------------------------  - Ritalin 10mg  for fatigue.  - Back pain/sciatica: L2-S1 decompression in 10/17 w Katheran Awe.  Off narcotic analgesics.  He is followed by Maryruth Eve in the Pain Clinic who is now managing his analgesics.  The patient feels his chronic back pain persists despite spine RT, and given this plus MRI findings, his pain is likely not related to prostate cancer. He previously saw Dr. Sabino Gasser of PM&R, and tried an epidural injection but the patient feels this did not help.  Has seen Charlett Blake for pain in this clinic too.    - Ankle pain: Persists since December so >3 months, xray was negative, and say orthopaedics but focus was on pain management. Will refer to PM&R for this.   - Nocturia improved with Ditropan: has met with w [facility] in past.  - Hot flashes - on Gabapentin 300 TID, Katina Degree PhD has counseled, pharmacy seeing today again. Stable  - Recommended Ca/Vit D.  - For ED, has seen Dr. Colon Branch in the past.  Viagra does not help.  He has spoken with Urology about options.    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  - BMD 6/17: The femoral neck density is mildly low, but the spine and total femoral densities are normal.    I spent 41 minutes in records review, seeing the patient, documentation, and coordination today.    Return in 3 months in person for Lupron and evaluation and labs.     -------------------------------    Other Physicians:   Dr. Maryruth Eve [facility])  Dr. Carnella Guadalajara [facility] PM&R)  Dr. Nolon Rod (referring; Radiation Oncology)   Dr. Baxter Flattery (Urology)   Dr. Chrissie Noa  Blau (Anesthesia pain)   Dr. Hoy Morn (Urology, RE: ED)   Dr. 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     Current therapy: Lupron    Oncology history:   - Diagnosed 2008 with prostate cancer, PSA 13.5.   - Prostate biopsy on 04/02/07 w Gleason 3+4=7   - 2009 Radiation therapy to w Dr. Nonie Hoyer 78 Gy.   - 2012-13: Rising PSA   - 11/13: Saw [facility], felt not candidate for salvage RT   - 2/14: BS c/w 2009 with suspicious increased uptake at T5, L inferior iliac bone, subtle at L1.   - CT A/P shows sclerosis at L4-5 equivocal for DJD vs metastases, I discussed w radiology, felt most likely metastatic. - 3/14: Lupron started   - 12/14: Lupron held for intermittent approach  - 11/13/13: Restarted Lupron due to PSA rise with worsening back pain  - 8/15: Palliative RT to spine in [place], [place].  - 05/25/14:  MRI spine: Multilevel degenerative changes, most prominent at L3-4 and L4-L5, where there is marked spinal canal stenosis and crowding with potential indentation of the nerve roots. Severe bilateral neural foraminal narrowing at L4-5 and moderate to marked neuroforaminal narrowing at L3-4 on the right. Additional multilevel degenerative changes as outlined above.    - 08/15/15: Abnormal focal marrow signal and enhancement at the endplates surrounding the L4-5 disc space are favored to represent acutely inflamed Schmorl's nodes. Metastatic disease is felt less likely but is not entirely excluded, particularly given enlargement of the lesion at the L5 level. Continued follow-up recommended.  - 6/17: Bone mineral density: The femoral neck density is mildly low, but the spine and total femoral densities are normal.  - 10/17: L2-S1 decompression surgery w Dr. 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  - 12/17: L ankle pain, negative xray.  - 04/20/18: CT A/P: Stable heterogeneity of the left iliac wing representing known metastatic disease. No new evidence of metastatic disease within the abdomen/pelvis.  - 04/25/18: Bone scan: Increased radiotracer activity within the left iliac bone concerning for osseous metastases.  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  Ongoing back pain w L sciatica.  Hot flashes persist on Lupron, daily.  Nocturia/chronic dysuria: much better with Ditropan 0-1x/night.  No pain.  Persistent ED.  Remainder of 10 system ROS otherwise negative.    Physical exam:   There were no vitals taken for this visit.  NAD  A+Ox3  No visible rash  No dyspnea or cough  No apparent neurological deficit    Current Outpatient Medications   Medication Sig Dispense Refill   ??? DULERA 200-5 mcg/actuation HFAA 20 mcg Two (2) times a day.     ??? enzalutamide (XTANDI) 40 mg capsule Take 4 capsules (160 mg) by mouth daily. 120 capsule 11   ??? ezetimibe (ZETIA) 10 mg tablet Take 10 mg by mouth daily. Frequency:QD   Dosage:10   MG  Instructions:  Note:Dose: 10MG      ??? gabapentin (NEURONTIN) 600 MG tablet Take 600 mg by mouth Two (2) times a day.     ??? hydroCHLOROthiazide (HYDRODIURIL) 25 MG tablet Take 1 tablet (25 mg total) by mouth daily. 30 tablet 11   ??? lisinopril (PRINIVIL,ZESTRIL) 40 MG tablet  Take 40 mg by mouth daily.      ??? MYRBETRIQ 25 mg Tb24 extended-release tablet TAKE 1 TABLET BY MOUTH ONCE DAILY. 30 tablet 11   ??? omeprazole (PRILOSEC) 20 MG capsule Take 20 mg by mouth daily.     ??? oxybutynin (DITROPAN) 5 MG tablet Take by mouth. 5 mg qAM and 15 mg qPM  0   ??? predniSONE (DELTASONE) 20 MG tablet 20 mg daily.     ??? pregabalin (LYRICA) 150 MG capsule Take 1 capsule (150 mg total) by mouth three (3) times a day (at 6am, noon and 6pm). 90 capsule 1   ??? tamsulosin (FLOMAX) 0.4 mg capsule Take 1 capsule (0.4 mg total) by mouth daily. 30 capsule 11   ??? traMADol (ULTRAM) 50 mg tablet Take 50 mg by mouth every six (6) hours as needed for pain.       No current facility-administered medications for this visit.       Allergies   Allergen Reactions   ??? Poison [employer]- Rash per patient       Laboratory:   06/27/12: PSA=3 (on Casodex), testosterone >800     Lab Results   Component Value Date    PSA Diagnostic 0.6 05/14/2014    PSA Diagnostic 0.5 02/05/2014    PSA Diagnostic 6.9 (H) 11/06/2013    PSA Diagnostic 2.4 07/24/2013    PSA Diagnostic 0.3 03/20/2013    PSA Diagnostic 0.2 12/26/2012       No visits with results within 2 Week(s) from this visit.   Latest known visit with results is:   Lab on 05/24/2020   Component Date Value Ref Range Status   ??? PSA 05/24/2020 <0.04  0.00 - 4.00 ng/mL Final     Lab Results   Component Value Date    PSA <0.04 05/24/2020    PSA <0.04 12/25/2019    PSA <0.10 09/18/2019    PSA <0.10 06/12/2019    PSA 0.11 03/03/2019    PSA <0.10 11/28/2018     3/17: Thyroid function WNL.

## 2020-08-26 NOTE — Unmapped (Signed)
Pt received lupron 22.5mg  injection in right ventroGluteus. Tolerated it well. Applied guaze and band aid.

## 2020-08-27 NOTE — Unmapped (Signed)
[facility] Refill Coordination Note    Specialty Medication(s) to be Shipped:   Hematology/Oncology: Calvin Obrien    Other medication(s) to be shipped: No additional medications requested for fill at this time     Calvin Obrien, DOB: 09/22/1950  Phone: [phone] (home)       All above HIPAA information was verified with patient.     Was a Nurse, learning disability used for this call? No    Completed refill call assessment today to schedule patient's medication shipment from the [facility] [phone]).  All relevant notes have been reviewed.     Specialty medication(s) and dose(s) confirmed: Regimen is correct and unchanged.   Changes to medications: [place] reports no changes at this time.  Changes to insurance: No  New side effects reported not previously addressed with a pharmacist or physician: None reported  Questions for the pharmacist: No    Confirmed patient received a Conservation officer, historic buildings and a Surveyor, mining with first shipment. The patient will receive a drug information handout for each medication shipped and additional FDA Medication Guides as required.       DISEASE/MEDICATION-SPECIFIC INFORMATION        N/A    SPECIALTY MEDICATION ADHERENCE     Medication Adherence    Patient reported X missed doses in the last month: 0  Specialty Medication: Xtandi 40 mg  Patient is on additional specialty medications: No  Informant: patient              Were doses missed due to medication being on hold? No    Xtandi 40 mg: 7 days of medicine on hand       REFERRAL TO PHARMACIST     Referral to the pharmacist: Not needed      [facility]     Shipping address confirmed in Epic.     Delivery Scheduled: Yes, Expected medication delivery date: 09/02/20.     Medication will be delivered via UPS to the prescription address in Epic [place].    Calvin Obrien M Calvin Obrien   [facility] Specialty Technician

## 2020-09-01 MED FILL — XTANDI 40 MG CAPSULE: ORAL | 30 days supply | Qty: 120 | Fill #10

## 2020-09-28 NOTE — Unmapped (Signed)
[facility] Refill Coordination Note    Specialty Medication(s) to be Shipped:   Hematology/Oncology: Calvin Obrien    Other medication(s) to be shipped: No additional medications requested for fill at this time     Calvin Obrien, DOB: 02/01/1951  Phone: [phone] (home)       All above HIPAA information was verified with patient.     Was a Nurse, learning disability used for this call? No    Completed refill call assessment today to schedule patient's medication shipment from the [facility] [phone]).  All relevant notes have been reviewed.     Specialty medication(s) and dose(s) confirmed: Regimen is correct and unchanged.   Changes to medications: [name] reports no changes at this time.  Changes to insurance: No  New side effects reported not previously addressed with a pharmacist or physician: None reported  Questions for the pharmacist: No    Confirmed patient received a Conservation officer, historic buildings and a Surveyor, mining with first shipment. The patient will receive a drug information handout for each medication shipped and additional FDA Medication Guides as required.       DISEASE/MEDICATION-SPECIFIC INFORMATION        N/A    SPECIALTY MEDICATION ADHERENCE     Medication Adherence    Patient reported X missed doses in the last month: 0  Specialty Medication: Xtandi 40 mg  Patient is on additional specialty medications: No  Informant: patient              Were doses missed due to medication being on hold? No    Xtandi 40 mg: 6 days of medicine on hand       REFERRAL TO PHARMACIST     Referral to the pharmacist: Not needed      [facility]     Shipping address confirmed in Epic.     Delivery Scheduled: Yes, Expected medication delivery date: 10/01/20.     Medication will be delivered via UPS to the prescription address in Epic [place].    Calvin Obrien   [facility] Specialty Technician

## 2020-10-01 ENCOUNTER — Ambulatory Visit: Admit: 2020-10-01 | Payer: MEDICAID

## 2020-10-01 MED FILL — XTANDI 40 MG CAPSULE: ORAL | 30 days supply | Qty: 120 | Fill #11

## 2020-10-01 NOTE — Unmapped (Unsigned)
[facility] NEW PATIENT VISIT    ID/CC:    No chief complaint on file.      Consult requested by:     Chrisandra Netters, MD  [address]  [id number]  Medicine  [place],  [place] [address]    History of Present Illness:   CAL GINDLESPERGER is a 70 y.o. male who presents to me in consultation for ED while on ADT.    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 Also sees Barkley Bruns to manage voiding symptoms    ED   Patient has had a slow decline in erectile function   Spontaneous   He has tried ***??          Labs Reviewed today with the patient include:    Lab Results   Component Value Date    Testosterone 19 (L) 12/25/2019    Testosterone 38 (L) 09/18/2019    Testosterone 32 (L) 06/12/2019    Testosterone 13 (L) 05/14/2014    Testosterone 13 (L) 02/05/2014    Testosterone 467 11/06/2013    Hemoglobin A1C 5.5 03/08/2017      Lab Results   Component Value Date/Time    PSA <0.04 08/26/2020 10:08 AM    PSA <0.04 05/24/2020 07:56 AM    PSA <0.04 12/25/2019 08:49 AM    PSADIAG 0.6 05/14/2014 10:08 AM    PSADIAG 0.5 02/05/2014 09:39 AM    PSADIAG 6.9 (H) 11/06/2013 10:49 AM                       Past Medical Hx:    Past Medical History:   Diagnosis Date   ??? Acid reflux    ??? Hyperlipemia    ??? Hypertension    ??? Prostate cancer (CMS-HCC)     bone metastasis     Patient Active Problem List   Diagnosis   ??? 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 Lethargy   ??? Chronic bilateral low back pain   ??? Essential hypertension   ??? Allergic rhinitis   ??? Uncomplicated asthma   ??? Lumbar radiculopathy   ??? Acute left ankle pain   ??? Hot flashes   ??? Infection and inflammatory reaction due to implanted penile prosthesis, initial encounter (CMS-HCC)   ??? Back pain   ??? Chronic ankle pain   ??? Nocturia   ??? Acute respiratory distress   ??? Acute respiratory failure with hypoxia (CMS-HCC)   ??? Anemia   ??? Colon cancer (CMS-HCC)   ??? Constipation due to opioid therapy   ??? Current every day smoker   ??? High cholesterol   ??? Hyponatremia   ??? Impotence of organic origin   ??? Leukocytosis   ??? LLL pneumonia   ??? Luetscher's syndrome   ??? Malignant neoplasm of prostate (CMS-HCC)   ??? Opioid dependence (CMS-HCC)   ??? Pneumonia   ??? Rotator cuff arthropathy, left   ??? Shoulder arthritis       Past Surgical Hx:    Past Surgical History:   Procedure Laterality Date   ??? PR INSERT,INFLATABLE PENILE PROSTHESIS N/A 03/16/2017    Procedure: INSERTION MULTI-COMPONENT INFLATABLE PENILE PROSTHESIS INCLUDING PLACEMENT OF PUMP, CYLINDERS & RESERVOIR;  Surgeon: Turner Daniels, MD;  [facility];  Service: Urology   ??? PR LAM FACETECTOMY&FORAMOT 1 VRT SGM EA ADDL SGM N/A 01/31/2016    Procedure: LAMINECTMY 1 SEGMT-UNI/BIL; EA ADD  CERV/THOR/LUM X4;  Surgeon: Timothy Lasso, MD;  [facility];  Service: Orthopedics   ??? PR LAMINEC/FACETECT/FORAMIN,LUMBAR 1 SEG N/A 01/31/2016    Procedure: LAMINECTOMY, FACETECTOMY & FORAMINOTOMY(UNI/BILAT W/DECOMP SPINAL CORD), SINGLE VERTEBRAL SEGMENT; LUMBAR;  Surgeon: Timothy Lasso, MD;  Location: [facility] OR [facility];  Service: Orthopedics   ??? PR REMVL,INFLAT PENILE PROSTH W/O REPLACMT N/A 04/20/2017    Procedure: REMOVAL OF ALL COMPONENTS OF A MULTI-COMPONENT, INFLATABLE PENILE PROSTH WO REPLACEMENT OF PROSTHESIS;  Surgeon: Turner Daniels, MD;  Location: [facility] OR [facility];  Service: Urology         Active Meds:    No outpatient medications have been marked as taking for the 10/01/20 encounter (Appointment) with Lytle Butte, MD.        Allergies:    Allergies as of 10/01/2020 - Reviewed 08/26/2020   Allergen Reaction Noted   ??? Poison ivy extract  09/18/2019         OBJECTIVE:  Physical Exam:  There were no vitals taken for this visit.    GU Exam:    *** circumcised male phallus. *** plaque. Orthotopic meatus.  Bilaterally descended testicles.           ASSESSMENT/PLAN:   RANALD ALESSIO is a 70 y.o. male with:      ***    Thank you very much for sending Mr. DELMAN GOSHORN to our clinic at [facility].

## 2020-10-19 DIAGNOSIS — C7951 Secondary malignant neoplasm of bone: Principal | ICD-10-CM

## 2020-10-19 DIAGNOSIS — C61 Malignant neoplasm of prostate: Principal | ICD-10-CM

## 2020-10-19 MED ORDER — XTANDI 40 MG CAPSULE
ORAL_CAPSULE | Freq: Every day | ORAL | 11 refills | 30 days
Start: 2020-10-19 — End: ?

## 2020-10-21 MED ORDER — XTANDI 40 MG CAPSULE
ORAL_CAPSULE | Freq: Every day | ORAL | 11 refills | 30 days | Status: CP
Start: 2020-10-21 — End: ?
  Filled 2020-11-04: qty 120, 30d supply, fill #0

## 2020-10-26 NOTE — Unmapped (Signed)
The [name] has made a second and final attempt to reach this patient to refill the following medication:Xtandi.      We have left voicemail with Pt at the following phone numbers: [phone] and have been unable to leave messages on the following phone numbers: [phone].    Dates contacted: 7/19,26  Last scheduled delivery: 10/01/20    The patient may be at risk of non-compliance with this medication. The patient should call the [facility] at [phone] (option 4) to refill medication.    Calvin Obrien Calvin Obrien   [facility] Specialty Technician

## 2020-11-03 NOTE — Unmapped (Signed)
[name] Refill Coordination Note    Specialty Medication(s) to be Shipped:   Hematology/Oncology: Calvin Obrien    Other medication(s) to be shipped: No additional medications requested for fill at this time     Calvin Obrien, DOB: 01-10-1951  Phone: [phone] (home)       All above HIPAA information was verified with patient.     Was a Nurse, learning disability used for this call? No    Completed refill call assessment today to schedule patient's medication shipment from the [facility] [phone]).  All relevant notes have been reviewed.     Specialty medication(s) and dose(s) confirmed: Regimen is correct and unchanged.   Changes to medications: [name] reports no changes at this time.  Changes to insurance: No  New side effects reported not previously addressed with a pharmacist or physician: None reported  Questions for the pharmacist: No    Confirmed patient received a Conservation officer, historic buildings and a Surveyor, mining with first shipment. The patient will receive a drug information handout for each medication shipped and additional FDA Medication Guides as required.       DISEASE/MEDICATION-SPECIFIC INFORMATION        N/A    SPECIALTY MEDICATION ADHERENCE     Medication Adherence    Patient reported X missed doses in the last month: 0  Specialty Medication: Xtandi 40 mg  Patient is on additional specialty medications: No  Patient is on more than two specialty medications: No  Any gaps in refill history greater than 2 weeks in the last 3 months: no  Demonstrates understanding of importance of adherence: yes  Informant: patient              Were doses missed due to medication being on hold? No    Xtandi 40mg : Patient has 0 days of medication on hand     REFERRAL TO PHARMACIST     Referral to the pharmacist: Not needed      [facility]     Shipping address confirmed in Epic.     Delivery Scheduled: Yes, Expected medication delivery date: 8/5.     Medication will be delivered via UPS to the prescription address in Epic [place].    Olga Millers   [facility] Specialty Technician

## 2020-11-25 ENCOUNTER — Encounter: Admit: 2020-11-25 | Discharge: 2020-11-26 | Payer: MEDICARE

## 2020-11-25 ENCOUNTER — Encounter: Admit: 2020-11-25 | Discharge: 2020-11-26 | Payer: MEDICARE | Attending: Medical Oncology | Primary: Medical Oncology

## 2020-11-25 ENCOUNTER — Ambulatory Visit: Admit: 2020-11-25 | Discharge: 2020-11-26 | Payer: MEDICARE

## 2020-11-25 DIAGNOSIS — C7951 Secondary malignant neoplasm of bone: Principal | ICD-10-CM

## 2020-11-25 DIAGNOSIS — C61 Malignant neoplasm of prostate: Principal | ICD-10-CM

## 2020-11-25 LAB — PSA: PROSTATE SPECIFIC ANTIGEN: <0.04 ng/mL (ref 0.00–4.00)

## 2020-11-25 LAB — TESTOSTERONE: TESTOSTERONE TOTAL: 21 ng/dL — ABNORMAL LOW

## 2020-11-25 MED ADMIN — leuprolide (LUPRON) injection 22.5 mg: 22.5 mg | INTRAMUSCULAR | @ 17:00:00 | Stop: 2020-11-25

## 2020-11-25 NOTE — Unmapped (Signed)
[facility] Clinical Assessment & Refill Coordination Note    Calvin Obrien, DOB: September 22, 1950  Phone: [phone] (home)     All above HIPAA information was verified with patient.     Was a Nurse, learning disability used for this call? No    Specialty Medication(s):   Hematology/Oncology: [name]     Current Outpatient Medications   Medication Sig Dispense Refill   ??? DULERA 200-5 mcg/actuation HFAA 20 mcg Two (2) times a day.     ??? enzalutamide (XTANDI) 40 mg capsule Take 4 capsules (160 mg) by mouth daily. 120 capsule 11   ??? ezetimibe (ZETIA) 10 mg tablet Take 10 mg by mouth daily. Frequency:QD   Dosage:10   MG  Instructions:  Note:Dose: 10MG      ??? gabapentin (NEURONTIN) 600 MG tablet Take 600 mg by mouth Two (2) times a day.     ??? hydroCHLOROthiazide (HYDRODIURIL) 25 MG tablet Take 1 tablet (25 mg total) by mouth daily. 30 tablet 11   ??? lisinopril (PRINIVIL,ZESTRIL) 40 MG tablet Take 40 mg by mouth daily.      ??? MYRBETRIQ 25 mg Tb24 extended-release tablet TAKE 1 TABLET BY MOUTH ONCE DAILY. 30 tablet 11   ??? omeprazole (PRILOSEC) 20 MG capsule Take 20 mg by mouth daily.     ??? oxybutynin (DITROPAN) 5 MG tablet Take by mouth. 5 mg qAM and 15 mg qPM  0   ??? oxyCODONE (ROXICODONE) 5 MG immediate release tablet 5 mg  in the morning.     ??? predniSONE (DELTASONE) 20 MG tablet 20 mg daily.     ??? pregabalin (LYRICA) 150 MG capsule Take 1 capsule (150 mg total) by mouth three (3) times a day (at 6am, noon and 6pm). 90 capsule 1   ??? tamsulosin (FLOMAX) 0.4 mg capsule Take 1 capsule (0.4 mg total) by mouth daily. 30 capsule 11   ??? traMADol (ULTRAM) 50 mg tablet Take 50 mg by mouth every six (6) hours as needed for pain.       No current facility-administered medications for this visit.        Changes to medications: Roniel reports no changes at this time.    Allergies   Allergen Reactions   ??? Poison [employer]- Rash per patient       Changes to allergies: No    SPECIALTY MEDICATION ADHERENCE Xtandi 40 mg: 10 days of medicine on hand       Medication Adherence    Patient reported X missed doses in the last month: 0  Specialty Medication: Xtandi 40 mg  Patient is on additional specialty medications: No  Informant: patient  Confirmed plan for next specialty medication refill: delivery by pharmacy          Specialty medication(s) dose(s) confirmed: Regimen is correct and unchanged.     Are there any concerns with adherence? No    Adherence counseling provided? Not needed    CLINICAL MANAGEMENT AND INTERVENTION      Clinical Benefit Assessment:    Do you feel the medicine is effective or helping your condition? Yes    Clinical Benefit counseling provided? Not needed    Adverse Effects Assessment:    Are you experiencing any side effects? No    Are you experiencing difficulty administering your medicine? No    Quality of Life Assessment:    Quality of Life    Rheumatology  Oncology  1. What impact  has your specialty medication had on the reduction of your daily pain or discomfort level?: Some  2. On a scale of 1-10, how would you rate your ability to manage side effects associated with your specialty medication? (1=no issues, 10 = unable to take medication due to side effects): 1  Dermatology  Cystic Fibrosis          How many days over the past month did your prostate cancer  keep you from your normal activities? For example, brushing your teeth or getting up in the morning. 0    Have you discussed this with your provider? Not needed    Acute Infection Status:    Acute infections noted within Epic:  No active infections  Patient reported infection: None    Therapy Appropriateness:    Is therapy appropriate? Yes, therapy is appropriate and should be continued    DISEASE/MEDICATION-SPECIFIC INFORMATION      N/A    PATIENT SPECIFIC NEEDS     - Does the patient have any physical, cognitive, or cultural barriers? No    - Is the patient high risk? No    - Does the patient require a Care Management Plan? No - Does the patient require physician intervention or other additional services (i.e. nutrition, smoking cessation, social work)? No      SHIPPING     Specialty Medication(s) to be Shipped:   Hematology/Oncology: [name]    Other medication(s) to be shipped: No additional medications requested for fill at this time     Changes to insurance: No    Delivery Scheduled: Yes, Expected medication delivery date: 12/03/20.     Medication will be delivered via UPS to the confirmed prescription address in [facility].    The patient will receive a drug information handout for each medication shipped and additional FDA Medication Guides as required.  Verified that patient has previously received a Conservation officer, historic buildings and a Surveyor, mining.    The patient or caregiver noted above participated in the development of this care plan and knows that they can request review of or adjustments to the care plan at any time.      All of the patient's questions and concerns have been addressed.    Nonie Hoyer, PharmD  PGY1 Community-based Pharmacy Resident  [facility]    I reviewed this patient case and all documentation provided by the learner and was readily available for consultation during their interaction with the patient.  I agree with the assessment and plan listed below.    [name] Shared [facility] Specialty Pharmacist

## 2020-11-25 NOTE — Unmapped (Addendum)
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  After adding enzalutamide, PSA dropped to <0.10 on 11/28/18, 0.11 on 03/03/19, <0.10 on 06/12/19, <0.10 on 09/18/19, <0.04 on 12/25/19.    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  He is followed in a pain clinic and has seen Palliative Care.    He is feeling very well with good energy.    Based on PSA values and how he is feeling, we will continue current therapy with Lupron and enzalutamide.    Lab Results   Component Value Date    PSA <0.04 11/25/2020    PSA <0.04 08/26/2020    PSA <0.04 05/24/2020    PSA <0.04 12/25/2019    PSA <0.10 09/18/2019    PSA <0.10 06/12/2019       Plan:   - Continue 57-month Lupron 22.5mg  (11/25/20).  (I tried to change to 69-month injection to reduce exposure to health system due to coronavirus but insurance refused this apparently.)  - Continue enzalutamide.  - He is scheduled for 12/02/20 with [facility] for ED assessment (and can also discuss urinary leakage).  - Follow PSA.  - He will follow up with his pain clinic and with palliative Care here as warranted.  - He will meet about gas money today to help him out.  - Urinary leakage: He has seen Barkley Bruns, NP for evaluation. I recommended pads.  - BMD scan was done 11/25/20 with low bone density - starting Fosamax.    ----------------------------------------------  - Ritalin 10mg  for fatigue.  - Back pain/sciatica: L2-S1 decompression in 10/17 w Katheran Awe.  Off narcotic analgesics.  He is followed by Maryruth Eve in the Pain Clinic who is now managing his analgesics.  The patient feels his chronic back pain persists despite spine RT, and given this plus MRI findings, his pain is likely not related to prostate cancer. He previously saw Dr. Sabino Gasser of PM&R, and tried an epidural injection but the patient feels this did not help.  Has seen Charlett Blake for pain in this clinic too.    - Ankle pain: Persists since December so >3 months, xray was negative, and say orthopaedics but focus was on pain management. Will refer to PM&R for this.   - Nocturia improved with Ditropan: has met with w [facility] in past.  - Hot flashes - on Gabapentin 300 TID, Katina Degree PhD has counseled, pharmacy seeing today again. Stable  - Recommended Ca/Vit D.  - For ED, has seen Dr. Colon Branch in the past.  Viagra does not help.  He has spoken with Urology about options.    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  - BMD 6/17: The femoral neck density is mildly low, but the spine and total femoral densities are normal.    I spent 42 minutes in records review, seeing the patient, documentation, and coordination today.    Return in 3 months in person for Lupron and evaluation and labs.     -------------------------------    Other Physicians:   Dr.  Maryruth Eve [facility])  Dr. Carnella Guadalajara [facility] PM&R)  Dr. Nolon Rod (referring; Radiation Oncology)   Dr. Baxter Flattery (Urology)   Dr. Nuala Alpha (Anesthesia pain)   Dr. Hoy Morn (Urology, RE: ED)   Dr. 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     Current therapy: Lupron    Oncology history:   - Diagnosed 2008 with prostate cancer, PSA 13.5.   - Prostate biopsy on 04/02/07 w Gleason 3+4=7   - 2009 Radiation therapy to w Dr. Nonie Hoyer 78 Gy.   - 2012-13: Rising PSA   - 11/13: Saw [place] rad onc, felt not candidate for salvage RT   - 2/14: BS c/w 2009 with suspicious increased uptake at T5, L inferior iliac bone, subtle at L1.   - CT A/P shows sclerosis at L4-5 equivocal for DJD vs metastases, I discussed w radiology, felt most likely metastatic.   - 3/14: Lupron started   - 12/14: Lupron held for intermittent approach  - 11/13/13: Restarted Lupron due to PSA rise with worsening back pain  - 8/15: Palliative RT to spine in [place], [place].  - 05/25/14:  MRI spine: Multilevel degenerative changes, most prominent at L3-4 and L4-L5, where there is marked spinal canal stenosis and crowding with potential indentation of the nerve roots. Severe bilateral neural foraminal narrowing at L4-5 and moderate to marked neuroforaminal narrowing at L3-4 on the right. Additional multilevel degenerative changes as outlined above.    - 08/15/15: Abnormal focal marrow signal and enhancement at the endplates surrounding the L4-5 disc space are favored to represent acutely inflamed Schmorl's nodes. Metastatic disease is felt less likely but is not entirely excluded, particularly given enlargement of the lesion at the L5 level. Continued follow-up recommended.  - 6/17: Bone mineral density: The femoral neck density is mildly low, but the spine and total femoral densities are normal.  - 10/17: L2-S1 decompression surgery w Dr. 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  - 12/17: L ankle pain, negative xray.  - 04/20/18: CT A/P: Stable heterogeneity of the left iliac wing representing known metastatic disease. No new evidence of metastatic disease within the abdomen/pelvis.  - 04/25/18: Bone scan: Increased radiotracer activity within the left iliac bone concerning for osseous metastases.  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  Ongoing back pain w L sciatica.  Hot flashes persist on Lupron, daily.  Nocturia/chronic dysuria: much better with Ditropan 0-1x/night.  No pain.  Persistent ED.  Remainder of 10 system ROS otherwise negative.    Physical exam:   BP 147/60  - Pulse 60  - Temp 36.2 ??C (97.1 ??F) (Oral)  - Resp 16  - Ht 170.2 cm (5' 7)  - Wt 89.6 kg (197 lb 8 oz)  - SpO2 100%  - BMI 30.93 kg/m??   NAD  A+Ox3  No visible rash  No dyspnea or cough  No apparent neurological deficit    Current Outpatient Medications   Medication Sig Dispense Refill   ??? DULERA 200-5 mcg/actuation HFAA 20 mcg Two (2) times a day.     ??? enzalutamide (XTANDI) 40 mg capsule Take 4 capsules (160 mg) by mouth daily. 120 capsule 11   ??? ezetimibe (ZETIA) 10 mg tablet Take 10  mg by mouth daily. Frequency:QD   Dosage:10   MG  Instructions:  Note:Dose: 10MG      ??? gabapentin (NEURONTIN) 600 MG tablet Take 600 mg by mouth Two (2) times a day.     ??? hydroCHLOROthiazide (HYDRODIURIL) 25 MG tablet Take 1 tablet (25 mg total) by mouth daily. 30 tablet 11   ??? lisinopril (PRINIVIL,ZESTRIL) 40 MG tablet Take 40 mg by mouth daily.      ??? MYRBETRIQ 25 mg Tb24 extended-release tablet TAKE 1 TABLET BY MOUTH ONCE DAILY. 30 tablet 11   ??? omeprazole (PRILOSEC) 20 MG capsule Take 20 mg by mouth daily.     ??? oxybutynin (DITROPAN) 5 MG tablet Take by mouth. 5 mg qAM and 15 mg qPM  0   ??? oxyCODONE (ROXICODONE) 5 MG immediate release tablet 5 mg  in the morning.     ??? predniSONE (DELTASONE) 20 MG tablet 20 mg daily.     ??? pregabalin (LYRICA) 150 MG capsule Take 1 capsule (150 mg total) by mouth three (3) times a day (at 6am, noon and 6pm). 90 capsule 1   ??? tamsulosin (FLOMAX) 0.4 mg capsule Take 1 capsule (0.4 mg total) by mouth daily. 30 capsule 11   ??? traMADol (ULTRAM) 50 mg tablet Take 50 mg by mouth every six (6) hours as needed for pain.       No current facility-administered medications for this visit.       Allergies   Allergen Reactions   ??? Poison [employer]- Rash per patient       Laboratory:   06/27/12: PSA=3 (on Casodex), testosterone >800     Lab Results   Component Value Date    PSA Diagnostic 0.6 05/14/2014    PSA Diagnostic 0.5 02/05/2014    PSA Diagnostic 6.9 (H) 11/06/2013    PSA Diagnostic 2.4 07/24/2013    PSA Diagnostic 0.3 03/20/2013    PSA Diagnostic 0.2 12/26/2012       Lab on 11/25/2020   Component Date Value Ref Range Status   ??? PSA 11/25/2020 <0.04  0.00 - 4.00 ng/mL Final     Lab Results   Component Value Date    PSA <0.04 11/25/2020    PSA <0.04 08/26/2020    PSA <0.04 05/24/2020    PSA <0.04 12/25/2019    PSA <0.10 09/18/2019    PSA <0.10 06/12/2019     3/17: Thyroid function WNL.

## 2020-12-02 ENCOUNTER — Ambulatory Visit: Admit: 2020-12-02 | Discharge: 2020-12-03 | Payer: MEDICARE

## 2020-12-02 MED ORDER — ALENDRONATE 70 MG TABLET
ORAL_TABLET | ORAL | 11 refills | 28 days | Status: CP
Start: 2020-12-02 — End: 2021-12-02

## 2020-12-02 MED FILL — XTANDI 40 MG CAPSULE: ORAL | 30 days supply | Qty: 120 | Fill #1

## 2020-12-02 NOTE — Unmapped (Signed)
Addended byFrederic Jericho on: 12/02/2020 12:41 PM     Modules accepted: Orders

## 2020-12-02 NOTE — Unmapped (Addendum)
[email or web address]-[email or web address].info/    Pos T Vac Manual    [place] Erecaid Esteem Manual     Go to [employer] or download the app to your smartphone    Find a convenient pharmacy location for you.  Print a coupon to the pharmacy of your choice or pull the coupon up in the app.  Bring the coupon and prescription to the pharmacy of your choice.    There is also a GoodRx app that can be downloaded onto your smart phone for convenience.    Usual best ones are    [employer]   [employer]   [employer]    [employer] has its own discount program called BLINK and/or VISORY that can be used.

## 2020-12-02 NOTE — Unmapped (Signed)
[facility] NEW PATIENT VISIT    ID/CC:    Chief Complaint   Patient presents with   ??? Erectile Dysfunction       Consult requested by:     Calvin Netters, MD  [address]  [id number] [facility]  Medicine  [place],  [place] [address]    History of Present Illness:   Calvin Obrien is a 70 y.o. male who presents to me in consultation for erectile dysfunction while on ADT/ Presents with son.    Is following with Calvin Obrien for ongoing cancer care    Oncology history:   - Diagnosed 2008 with prostate cancer, PSA 13.5.   - Prostate biopsy on 04/02/07 w Gleason 3+4=7   - 2009 Radiation therapy to w Dr. Nonie Hoyer 78 Gy.   - 2012-13: Rising PSA   - 11/13: Saw [facility] Ob[facility] rad onc, felt not candidate for salvage RT   - 2/14: BS c/w 2009 with suspicious increased uptake at T5, L inferior iliac bone, subtle at L1.   - CT A/P shows sclerosis at L4-5 equivocal for DJD vs metastases, I discussed w radiology, felt most likely metastatic.   - 3/14: Lupron started   - 12/14: Lupron held for intermittent approach  - 11/13/13: Restarted Lupron due to PSA rise with worsening back pain  - 8/15: Palliative RT to spine in [place], [place].  - 05/25/14: ??MRI spine: Multilevel degenerative changes, most prominent at L3-4 and L4-L5, where there is marked spinal canal stenosis and crowding with potential indentation of the nerve roots. Severe bilateral neural foraminal narrowing at L4-5 and moderate to marked neuroforaminal narrowing at L3-4 on the right. Additional multilevel degenerative changes as outlined above. ??  - 08/15/15: Abnormal focal marrow signal and enhancement at the endplates surrounding the L4-5 disc space are favored to represent acutely inflamed Schmorl's nodes. Metastatic disease is felt less likely but is not entirely excluded, particularly given enlargement of the lesion at the L5 level. Continued follow-up recommended.  - 6/17: Bone mineral density: The femoral neck density is mildly low, but the spine and total femoral densities are normal.  - 10/17: L2-S1 decompression surgery w Dr. 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  - 12/17: L ankle pain, negative xray.  - 04/20/18: CT A/P: Stable heterogeneity of the left iliac wing representing known metastatic disease. No new evidence of metastatic disease within the abdomen/pelvis.  - 04/25/18: Bone scan: Increased radiotracer activity within the left iliac bone concerning for osseous metastases.  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   Had IPP in 2019 that needed to be removed for infection   Very against implant now after prior issues      Labs Reviewed today with the patient include:    Lab Results   Component Value Date    Testosterone 21 (L) 11/25/2020    Testosterone 19 (L) 12/25/2019    Testosterone 38 (L) 09/18/2019    Testosterone 13 (L) 05/14/2014    Testosterone 13 (L) 02/05/2014    Testosterone 467 11/06/2013    Hemoglobin A1C 5.5 03/08/2017      Lab Results   Component Value Date/Time    PSA <0.04 11/25/2020 10:40 AM    PSA <0.04 08/26/2020 10:08 AM    PSA <0.04 05/24/2020 07:56 AM    PSADIAG 0.6 05/14/2014 10:08 AM    PSADIAG 0.5  02/05/2014 09:39 AM    PSADIAG 6.9 (H) 11/06/2013 10:49 AM                       Past Medical Hx:    Past Medical History:   Diagnosis Date   ??? Acid reflux    ??? Hyperlipemia    ??? Hypertension    ??? Prostate cancer (CMS-HCC)     bone metastasis     Patient Active Problem List   Diagnosis   ??? 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 Lethargy   ??? Chronic bilateral low back pain   ??? Essential hypertension   ??? Allergic rhinitis   ??? Uncomplicated asthma   ??? Lumbar radiculopathy   ??? Acute left ankle pain   ??? Hot flashes   ??? Infection and inflammatory reaction due to implanted penile prosthesis, initial encounter (CMS-HCC)   ??? Back pain   ??? Chronic ankle pain   ??? Nocturia   ??? Acute respiratory distress   ??? Acute respiratory failure with hypoxia (CMS-HCC)   ??? Anemia   ??? Colon cancer (CMS-HCC)   ??? Constipation due to opioid therapy   ??? Current every day smoker   ??? High cholesterol   ??? Hyponatremia   ??? Impotence of organic origin   ??? Leukocytosis   ??? LLL pneumonia   ??? Luetscher's syndrome   ??? Malignant neoplasm of prostate (CMS-HCC)   ??? Opioid dependence (CMS-HCC)   ??? Pneumonia   ??? Rotator cuff arthropathy, left   ??? Shoulder arthritis       Past Surgical Hx:    Past Surgical History:   Procedure Laterality Date   ??? PR INSERT,INFLATABLE PENILE PROSTHESIS N/A 03/16/2017    Procedure: INSERTION MULTI-COMPONENT INFLATABLE PENILE PROSTHESIS INCLUDING PLACEMENT OF PUMP, CYLINDERS & RESERVOIR;  Surgeon: Calvin Daniels, MD;  Location[facility] OR [facility];  Service: Urology   ??? PR LAM FACETECTOMY&FORAMOT 1 VRT SGM EA ADDL SGM N/A 01/31/2016    Procedure: LAMINECTMY 1 SEGMT-UNI/BIL; EA ADD CERV/THOR/LUM X4;  Surgeon: Calvin Lasso, MD;  Location: [facility] OR [facility];  Service: Orthopedics   ??? PR LAMINEC/FACETECT/FORAMIN,LUMBAR 1 SEG N/A 01/31/2016    Procedure: LAMINECTOMY, FACETECTOMY & FORAMINOTOMY(UNI/BILAT W/DECOMP SPINAL CORD), SINGLE VERTEBRAL SEGMENT; LUMBAR;  Surgeon: Calvin Lasso, MD;  Location: [facility] OR [facility];  Service: Orthopedics   ??? PR REMVL,INFLAT PENILE PROSTH W/O REPLACMT N/A 04/20/2017    Procedure: REMOVAL OF ALL COMPONENTS OF A MULTI-COMPONENT, INFLATABLE PENILE PROSTH WO REPLACEMENT OF PROSTHESIS;  Surgeon: Calvin Daniels, MD;  Location: [facility] OR [facility];  Service: Urology         Active Meds:    Outpatient Medications Marked as Taking for the 12/02/20 encounter (Office Visit) with Calvin Butte, MD   Medication Sig Dispense Refill   ??? DULERA 200-5 mcg/actuation HFAA 20 mcg Two (2) times a day.     ??? enzalutamide (XTANDI) 40 mg capsule Take 4 capsules (160 mg) by mouth daily. 120 capsule 11   ??? ezetimibe (ZETIA) 10 mg tablet Take 10 mg by mouth daily. Frequency:QD Dosage:10   MG  Instructions:  Note:Dose: 10MG      ??? gabapentin (NEURONTIN) 600 MG tablet Take 600 mg by mouth Two (2) times a day.     ??? hydroCHLOROthiazide (HYDRODIURIL) 25 MG tablet Take 1 tablet (25 mg total) by mouth daily. 30 tablet 11   ??? lisinopril (PRINIVIL,ZESTRIL) 40 MG tablet Take 40 mg by  mouth daily.      ??? MYRBETRIQ 25 mg Tb24 extended-release tablet TAKE 1 TABLET BY MOUTH ONCE DAILY. 30 tablet 11   ??? omeprazole (PRILOSEC) 20 MG capsule Take 20 mg by mouth daily.     ??? oxybutynin (DITROPAN) 5 MG tablet Take by mouth. 5 mg qAM and 15 mg qPM  0   ??? oxyCODONE (ROXICODONE) 5 MG immediate release tablet 5 mg  in the morning.     ??? predniSONE (DELTASONE) 20 MG tablet 20 mg daily.     ??? pregabalin (LYRICA) 150 MG capsule Take 1 capsule (150 mg total) by mouth three (3) times a day (at 6am, noon and 6pm). 90 capsule 1   ??? tamsulosin (FLOMAX) 0.4 mg capsule Take 1 capsule (0.4 mg total) by mouth daily. 30 capsule 11   ??? traMADol (ULTRAM) 50 mg tablet Take 50 mg by mouth every six (6) hours as needed for pain.          Allergies:    Allergies as of 12/02/2020 - Reviewed 12/02/2020   Allergen Reaction Noted   ??? Poison ivy extract  09/18/2019         OBJECTIVE:  Physical Exam:  BP 128/61 (BP Site: L Arm, BP Position: Sitting, BP Cuff Size: Large)  - Pulse 62  - Ht 169 cm (5' 6.54)  - Wt 89.8 kg (198 lb)  - BMI 31.44 kg/m??             ASSESSMENT/PLAN:   TYLEE NEWBY is a 70 y.o. 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  I spent significant, in depth time in counseling the patient and discussing options for treatment. These include :   1. Oral medications - risk of flushing, headache, blrurred vision, muscle aches  2. Injectable medications - risk of prolonged erection, pain at injection site, bleeding  3. Vacuum erection device - bruising or swelling, pain with the ring  4. Penile prosthesis - partial surgical counseling    We always start with the least invasive option and move toward the more invasive options.  With him we will start with the following:  -Tadalafil 20mg  po every day - rx provided   sildenafil 100mg  po prn - rx provided    Website provided for VED options to use in conjunction     - Increase exercise and healthy lifestyle.  We discussed that ED can be associated with cardiovascular health due to atherosclerosis effects on small vessels.      We would like to see him back in 3 months to see how he is doing.      Thank you very much for sending Calvin Obrien to our clinic at [facility].

## 2020-12-07 NOTE — Unmapped (Addendum)
TC to patient to let him know that Dr. Vernell Barrier has sent in Fosamax for his low bone mass. I let him know that this drug should be taken:    1) weekly  2) with at least 8 oz of water  3) do not lie down for 30 minutes after consumption    Patient is going to check with PCP re: getting pads cheaper.      Darleene Cumpian      ----- Message from Karie Georges, RN sent at 12/02/2020 12:36 PM EDT -----  Regarding: RE: Dexa Follow-Up  Fosamax  ----- Message -----  From: Karie Georges, RN  Sent: 11/29/2020   2:22 PM EDT  To: Chrisandra Netters, MD, Karie Georges, RN  Subject: Dexa Follow-Up                                   Following up on his Dexa:    IMPRESSION  ??  1.WHO classification is LOW BONE MASS.   2.There is a statistically significant decrease in bone mineral density in the interval.      Would you like to start something?    Thanks    [employer]

## 2020-12-14 DIAGNOSIS — M5442 Lumbago with sciatica, left side: Principal | ICD-10-CM

## 2020-12-20 NOTE — Unmapped (Signed)
[facility] Refill Coordination Note    Specialty Medication(s) to be Shipped:   Hematology/Oncology: [name]    Other medication(s) to be shipped: No additional medications requested for fill at this time     Calvin Obrien, DOB: 20-Apr-1950  Phone: [phone] (home)       All above HIPAA information was verified with patient.     Was a Nurse, learning disability used for this call? No    Completed refill call assessment today to schedule patient's medication shipment from the [facility] [phone]).  All relevant notes have been reviewed.     Specialty medication(s) and dose(s) confirmed: Regimen is correct and unchanged.   Changes to medications: [place]rich reports no changes at this time.  Changes to insurance: No  New side effects reported not previously addressed with a pharmacist or physician: None reported  Questions for the pharmacist: No    Confirmed patient received a Conservation officer, historic buildings and a Surveyor, mining with first shipment. The patient will receive a drug information handout for each medication shipped and additional FDA Medication Guides as required.       DISEASE/MEDICATION-SPECIFIC INFORMATION        N/A    SPECIALTY MEDICATION ADHERENCE     Medication Adherence    Patient reported X missed doses in the last month: 0  Specialty Medication: Xtandi 40 mg  Patient is on additional specialty medications: No  Informant: patient              Were doses missed due to medication being on hold? No    Xtandi 40 mg: 14 days of medicine on hand       REFERRAL TO PHARMACIST     Referral to the pharmacist: Not needed      [facility]     Shipping address confirmed in Epic.     Delivery Scheduled: Yes, Expected medication delivery date: 01/04/21.     Medication will be delivered via UPS to the prescription address in Epic [place].    [name]   [facility] Specialty Technician

## 2021-01-03 MED FILL — XTANDI 40 MG CAPSULE: ORAL | 30 days supply | Qty: 120 | Fill #2

## 2021-01-13 NOTE — Unmapped (Signed)
error 

## 2021-01-13 NOTE — Unmapped (Signed)
BUMP 10/13 No answer, No VM. 10/12 No answer, no VM, WELL text sent for pt to r/s.  10/11 No answer, no VM - Can use 2 back-to-back follow up slots per [facility] or next available (SD)

## 2021-01-21 NOTE — Unmapped (Signed)
[facility] Refill Coordination Note    Specialty Medication(s) to be Shipped:   Hematology/Oncology: [name]    Other medication(s) to be shipped: No additional medications requested for fill at this time     Calvin Obrien, DOB: 08-02-1950  Phone: [phone] (home)       All above HIPAA information was verified with patient.     Was a Nurse, learning disability used for this call? No    Completed refill call assessment today to schedule patient's medication shipment from the [facility] [phone]).  All relevant notes have been reviewed.     Specialty medication(s) and dose(s) confirmed: Regimen is correct and unchanged.   Changes to medications: Calvin Obrien reports no changes at this time.  Changes to insurance: No  New side effects reported not previously addressed with a pharmacist or physician: None reported  Questions for the pharmacist: No    Confirmed patient received a Conservation officer, historic buildings and a Surveyor, mining with first shipment. The patient will receive a drug information handout for each medication shipped and additional FDA Medication Guides as required.       DISEASE/MEDICATION-SPECIFIC INFORMATION        N/A    SPECIALTY MEDICATION ADHERENCE     Medication Adherence    Patient reported X missed doses in the last month: 0  Specialty Medication: Xtandi 40 mg  Patient is on additional specialty medications: No  Informant: patient              Were doses missed due to medication being on hold? No    Xtandi 40 mg: 14 days of medicine on hand       REFERRAL TO PHARMACIST     Referral to the pharmacist: Not needed      [facility]     Shipping address confirmed in Epic.     Delivery Scheduled: Yes, Expected medication delivery date: 02/02/21.     Medication will be delivered via UPS to the prescription address in Epic [place].    [name]   [facility] Specialty Technician

## 2021-02-01 MED FILL — XTANDI 40 MG CAPSULE: ORAL | 30 days supply | Qty: 120 | Fill #3

## 2021-02-18 NOTE — Unmapped (Signed)
[facility] Refill Coordination Note    Specialty Medication(s) to be Shipped:   Hematology/Oncology: [name]    Other medication(s) to be shipped: No additional medications requested for fill at this time     SANKALP FERRELL, DOB: [birth year]  Phone: [phone] (home)       All above HIPAA information was verified with patient.     Was a Nurse, learning disability used for this call? No    Completed refill call assessment today to schedule patient's medication shipment from the [facility] [phone]).  All relevant notes have been reviewed.     Specialty medication(s) and dose(s) confirmed: Regimen is correct and unchanged.   Changes to medications: Zacary reports no changes at this time.  Changes to insurance: No  New side effects reported not previously addressed with a pharmacist or physician: None reported  Questions for the pharmacist: No    Confirmed patient received a Conservation officer, historic buildings and a Surveyor, mining with first shipment. The patient will receive a drug information handout for each medication shipped and additional FDA Medication Guides as required.       DISEASE/MEDICATION-SPECIFIC INFORMATION        N/A    SPECIALTY MEDICATION ADHERENCE     Medication Adherence    Patient reported X missed doses in the last month: 0  Specialty Medication: Xtandi 40 mg  Patient is on additional specialty medications: No  Informant: patient              Were doses missed due to medication being on hold? No    Xtandi 40 mg: 14 days of medicine on hand       REFERRAL TO PHARMACIST     Referral to the pharmacist: Not needed      [facility]     Shipping address confirmed in Epic.     Delivery Scheduled: Yes, Expected medication delivery date: 03/03/21.     Medication will be delivered via UPS to the prescription address in Epic [place].    [name]   [facility] Specialty Technician

## 2021-03-02 MED FILL — XTANDI 40 MG CAPSULE: ORAL | 30 days supply | Qty: 120 | Fill #4

## 2021-03-03 ENCOUNTER — Ambulatory Visit: Admit: 2021-03-03 | Payer: MEDICARE | Attending: Medical Oncology | Primary: Medical Oncology

## 2021-03-03 ENCOUNTER — Ambulatory Visit: Admit: 2021-03-03 | Payer: MEDICARE

## 2021-03-03 NOTE — Unmapped (Signed)
I spoke with patient Calvin Obrien to confirm appointments on the following date(s): 12/15    [name]

## 2021-03-08 ENCOUNTER — Ambulatory Visit: Admit: 2021-03-08 | Discharge: 2021-03-09 | Payer: MEDICARE

## 2021-03-08 DIAGNOSIS — M5442 Lumbago with sciatica, left side: Principal | ICD-10-CM

## 2021-03-08 NOTE — Unmapped (Signed)
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 PT referral provided (External).    FOLLOW UP:   Return if symptoms worsen or fail to improve.  ??? Return sooner if needed.   ??? Advised to send a message via MyChart or call the clinic with any questions or concerns in the interim.    FUTURE CONSIDERATIONS: MRI Lumbar spine; Consideration of ESI     The patient???s diagnosis, prognosis and treatment options were discussed today. We also discussed the risks, benefits, alternatives, possible side effects, and instructions for all testing, procedures, and prescribed medications offered today. All questions were answered. Patient expressed agreement with the above treatment plan.  SUBJECTIVE     Reason for Visit  Foot Pain and Leg Pain    History of Present Illness  Calvin Obrien is a 70 y.o. 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 Onset/Duration: chronic, over 5 yrs   ??? Known inciting event: none; progression of prostate CA, mets to spine  ??? Description & location: Endorses intermittent sharp midline lumbar pain which radiates laterally into bilateral LEs up to the knees. Rated about 9/10.   ??? Numbness and tingling: Yes - b/l feet  ??? Weakness: no   ??? Frequency: daily, intermittent    ??? Progression since onset: stable   ??? Aggravating Factors: standing (can only stand about 5-10 min), movement/walking, truncal flexion  ??? Alleviating Factors: sitting/rest, truncal extension     Associated Signs/Symptoms:  Unintended weight loss: no   Fever, infection, or recent antibiotic treatment: no   Loss of bowel or bladder control: no   Saddle anesthesia: no     Prior Work-up:  ??? Previously saw pain psychology 04/2016   ??? [facility] - Dr. Loman Chroman   ??? TFESI in 2011 with Dr. Shelly Bombard - not helpful   ??? Spinal cord stimulatory trial in 2009 with Dr. Shelly Bombard     Treatments/Interventions:  ??? Physical therapy: last referral in 2021; endorses HEP daily, unclear if currently enrolled with PT   ??? Prior Medications [Helpful? (yes/no)]:    - Oxycodone 5mg  qweekly - helpful    - Lyrica 150mg  BID - mild helpful   - Tramadol 50mg  q6hrs prn - mild helpful   - Voltaren gel - mild helpful     Current Medications:   Current Outpatient Medications   Medication Sig Dispense Refill   ??? alendronate (FOSAMAX) 70 MG tablet Take 1  tablet (70 mg total) by mouth every seven (7) days. 4 tablet 11   ??? DULERA 200-5 mcg/actuation HFAA 20 mcg Two (2) times a day.     ??? enzalutamide (XTANDI) 40 mg capsule Take 4 capsules (160 mg) by mouth daily. 120 capsule 11   ??? ezetimibe (ZETIA) 10 mg tablet Take 10 mg by mouth daily. Frequency:QD   Dosage:10   MG  Instructions:  Note:Dose: 10MG      ??? gabapentin (NEURONTIN) 600 MG tablet Take 600 mg by mouth Two (2) times a day.     ??? hydroCHLOROthiazide (HYDRODIURIL) 25 MG tablet Take 1 tablet (25 mg total) by mouth daily. 30 tablet 11   ??? lisinopril (PRINIVIL,ZESTRIL) 40 MG tablet Take 40 mg by mouth daily.      ??? MYRBETRIQ 25 mg Tb24 extended-release tablet TAKE 1 TABLET BY MOUTH ONCE DAILY. 30 tablet 11   ??? omeprazole (PRILOSEC) 20 MG capsule Take 20 mg by mouth daily.     ??? oxybutynin (DITROPAN) 5 MG tablet Take by mouth. 5 mg qAM and 15 mg qPM  0   ??? oxyCODONE (ROXICODONE) 5 MG immediate release tablet 5 mg  in the morning.     ??? predniSONE (DELTASONE) 20 MG tablet 20 mg daily.     ??? pregabalin (LYRICA) 150 MG capsule Take 1 capsule (150 mg total) by mouth three (3) times a day (at 6am, noon and 6pm). 90 capsule 1   ??? sildenafiL (VIAGRA) 100 MG tablet Take 1 tablet (100 mg total) by mouth nightly as needed for erectile dysfunction. 90 tablet 3   ??? tadalafil 20 MG tablet Take 1 tablet (20 mg total) by mouth in the morning. 90 tablet 3   ??? tamsulosin (FLOMAX) 0.4 mg capsule Take 1 capsule (0.4 mg total) by mouth daily. 30 capsule 11   ??? traMADol (ULTRAM) 50 mg tablet Take 50 mg by mouth every six (6) hours as needed for pain.       No current facility-administered medications for this visit.     Allergies:   Poison ivy extract    PMH:   Past Medical History:   Diagnosis Date   ??? Acid reflux    ??? Hyperlipemia    ??? Hypertension    ??? Prostate cancer (CMS-HCC)     bone metastasis     PSH:   Past Surgical History:   Procedure Laterality Date   ??? PR INSERT,INFLATABLE PENILE PROSTHESIS N/A 03/16/2017    Procedure: INSERTION MULTI-COMPONENT INFLATABLE PENILE PROSTHESIS INCLUDING PLACEMENT OF PUMP, CYLINDERS & RESERVOIR;  Surgeon: Turner Daniels, MD;  Location: [facility];  Service: Urology   ??? PR LAM FACETECTOMY&FORAMOT 1 VRT SGM EA ADDL SGM N/A 01/31/2016    Procedure: LAMINECTMY 1 SEGMT-UNI/BIL; EA ADD CERV/THOR/LUM X4;  Surgeon: Timothy Lasso, MD;  Location: [facility] OR [facility];  Service: Orthopedics   ??? PR LAMINEC/FACETECT/FORAMIN,LUMBAR 1 SEG N/A 01/31/2016    Procedure: LAMINECTOMY, FACETECTOMY & FORAMINOTOMY(UNI/BILAT W/DECOMP SPINAL CORD), SINGLE VERTEBRAL SEGMENT; LUMBAR;  Surgeon: Timothy Lasso, MD;  [facility] OR [facility];  Service: Orthopedics   ??? PR REMVL,INFLAT PENILE PROSTH W/O REPLACMT N/A 04/20/2017    Procedure: REMOVAL OF ALL COMPONENTS OF A MULTI-COMPONENT, INFLATABLE PENILE PROSTH WO REPLACEMENT OF PROSTHESIS;  Surgeon: Turner Daniels, MD;  Location: [facility] OR [facility];  Service: Urology     OBJECTIVE     Temp 36.9 ??C (98.4 ??F)  - Ht 169 cm (5' 6.54)  -  Wt 86.5 kg (190 lb 12.8 oz)  - BMI 30.30 kg/m??     Physical Exam    General:  In no acute distress.  Extremities:  No edema present in bilateral lower extremities.  Skin:   No rash, ecchymosis, or other discoloration present on visible skin.    Neurologic:    - Sensation: Grossly intact to light touch in bilateral lower extremities, EXCEPT LLE dorsal midfoot-digits 1-5; and RLE medial malleoli to about 5cm proximal     - Strength:     Left Lower Extremity Right Lower Extremity   Hip Flexion  5 5   Knee Extension  5 5   Ankle Dorsiflexion  5 5   Hallux Extension  5 5   Hallux Flexion  5 5        - Reflexes:    Left Lower Extremity Right Lower Extremity    Patellar 2+ 2+   Achilles  2+ 2+       - Upper Motor Neuron Signs:   ??? Plantar reflex: down-going/mute bilaterally  ??? Clonus: none bilaterally    - Gait: normal and antalgic    Musculoskeletal:  Lumbar Spine Exam  Inspection:   No significant lower extremity muscle atrophy appreciated.   No gross deformity present.    Palpation:   Paraspinals: Tender bilaterally L2-5  Quadratus lumborum: Non-tender bilaterally   Midline: Tender L2-5  Gluteal muscles: Non-tender bilaterally  SIJ: Non-tender bilaterally    ROM:  Forward flexion: Within normal limits and painless   Extension: Within normal limits with reproduction of pain   Axial oblique loading: Within normal limits with reproduction of pain bilaterally    Special Tests:  Passive SLR: Negative bilaterally  Seated slump test: Negative bilaterally  Active SLR: Negative bilaterally      Imaging/Data Review:  Personally reviewed recent relevant documentation from the referring provider, if available.   ?? Pain medicine provider and pain psychologist notes from 2017-18   ?? Most recent Urology and Oncology provider notes 8 - 03/2021      Personally reviewed the images and official reports of the following diagnostic studies:  ??? Dexa bone density scan 11/2020 - shows osteopenia   ??? CT abdomen/pelvis (04/2018) - DDD with decreased disc height, worst at L3-5; mild dextroscoliosis; sclerosis at L4-5   ??? 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

## 2021-03-08 NOTE — Unmapped (Addendum)
Thank you for allowing [place] to care for you at [facility]. It was a pleasure taking care of you!     You were evaluated for lower back pain with sciatica.      Plan:  - Referral sent to a specific type of Physical Therapist who performs and educates about a back strengthening and conditioning program. Please call the number below to schedule an appointment at your earliest convenience. Schedule an appointment with me as needed after 4-6 weeks of PT with the office below.   Thalia Bloodgood  PT, Dip. MDT [facility]  [address]  [place], [place] [address] [phone]

## 2021-03-11 NOTE — Unmapped (Signed)
Calvin Obrien from [facility] in [place] LVM that they are seeing this patient for an initial assessment today and need the actual referral and the providers name.  The patient saw Dr Sabino Gasser 03/08/21.  Message forwarded to Reep, RN for follow up.  [phone]

## 2021-03-14 NOTE — Unmapped (Signed)
Order faxed to [phone].

## 2021-03-17 ENCOUNTER — Ambulatory Visit: Admit: 2021-03-17 | Discharge: 2021-03-18 | Payer: MEDICARE | Attending: Medical Oncology | Primary: Medical Oncology

## 2021-03-17 ENCOUNTER — Other Ambulatory Visit: Admit: 2021-03-17 | Discharge: 2021-03-18 | Payer: MEDICARE

## 2021-03-17 DIAGNOSIS — C7951 Secondary malignant neoplasm of bone: Principal | ICD-10-CM

## 2021-03-17 DIAGNOSIS — C61 Malignant neoplasm of prostate: Principal | ICD-10-CM

## 2021-03-17 LAB — COMPREHENSIVE METABOLIC PANEL
ALBUMIN: 3.6 g/dL (ref 3.4–5.0)
ALKALINE PHOSPHATASE: 94 U/L (ref 46–116)
ALT (SGPT): 8 U/L — ABNORMAL LOW (ref 10–49)
ANION GAP: 4 mmol/L — ABNORMAL LOW (ref 5–14)
AST (SGOT): 18 U/L (ref <=34)
BILIRUBIN TOTAL: 0.4 mg/dL (ref 0.3–1.2)
BLOOD UREA NITROGEN: 18 mg/dL (ref 9–23)
BUN / CREAT RATIO: 15
CALCIUM: 9.4 mg/dL (ref 8.7–10.4)
CHLORIDE: 107 mmol/L (ref 98–107)
CO2: 28 mmol/L (ref 20.0–31.0)
CREATININE: 1.22 mg/dL — ABNORMAL HIGH
EGFR CKD-EPI (2021) MALE: 63 mL/min/{1.73_m2} (ref >=60)
GLUCOSE RANDOM: 102 mg/dL (ref 70–179)
POTASSIUM: 4.5 mmol/L (ref 3.5–5.1)
PROTEIN TOTAL: 6.7 g/dL (ref 5.7–8.2)
SODIUM: 139 mmol/L (ref 135–145)

## 2021-03-17 LAB — PSA: PROSTATE SPECIFIC ANTIGEN: <0.04 ng/mL (ref 0.00–4.00)

## 2021-03-17 LAB — TESTOSTERONE: TESTOSTERONE TOTAL: 23 ng/dL — ABNORMAL LOW

## 2021-03-17 MED ADMIN — leuprolide (LUPRON) injection 22.5 mg: 22.5 mg | INTRAMUSCULAR | @ 15:00:00 | Stop: 2021-03-17

## 2021-03-17 NOTE — Unmapped (Signed)
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  After adding enzalutamide, PSA dropped to non-detectable.    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  He is followed in a pain clinic and has seen Palliative Care.    He is feeling very well with good energy.    Based on PSA values and how he is feeling, we will continue current therapy with Lupron and enzalutamide.    Lab Results   Component Value Date    PSA <0.04 03/17/2021    PSA <0.04 11/25/2020    PSA <0.04 08/26/2020    PSA <0.04 05/24/2020    PSA <0.04 12/25/2019    PSA <0.10 09/18/2019       Plan:   - Continue 63-month Lupron 22.5mg  (03/17/21).  (I tried to change to 55-month injection but insurance refused this apparently.)  - Continue enzalutamide.  - He was previously referred to [facility] for ED assessment (and can also discuss urinary leakage).  - Follow PSA.  - He will follow up with his pain clinic and with palliative Care here as warranted.  - He will meet about gas money today to help him out.  - Urinary leakage: He has seen Barkley Bruns, NP for evaluation. I recommended pads.  - BMD scan was done 11/25/20 with low bone density - started Fosamax.    I spent 41 minutes on records review, meeting with the patient, documentation and coordination on the day of service.    ----------------------------------------------  - Ritalin 10mg  for fatigue.  - Back pain/sciatica: L2-S1 decompression in 10/17 w Katheran Awe.  Off narcotic analgesics.  He is followed by Maryruth Eve in the Pain Clinic who is now managing his analgesics.  The patient feels his chronic back pain persists despite spine RT, and given this plus MRI findings, his pain is likely not related to prostate cancer. He previously saw Dr. Sabino Gasser of PM&R, and tried an epidural injection but the patient feels this did not help.  Has seen Charlett Blake for pain in this clinic too.    - Ankle pain: Persists since December so >3 months, xray was negative, and say orthopaedics but focus was on pain management. Will refer to PM&R for this.   - Nocturia improved with Ditropan: has met with w [facility] in past.  - Hot flashes - on Gabapentin 300 TID, Katina Degree PhD has counseled, pharmacy seeing today again. Stable  - Recommended Ca/Vit D.  - For ED, has seen Dr. Colon Branch in the past.  Viagra does not help.  He has spoken with Urology about options.    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  - BMD 6/17: The femoral neck density is mildly low, but the spine and total femoral densities are normal.    I spent 42 minutes in records review, seeing the patient, documentation, and coordination today.    Return in 3 months in person for Lupron and evaluation and labs.     -------------------------------    Other Physicians:   Dr. Maryruth Eve (  [facility])  Dr. Carnella Guadalajara [facility])  Dr. Nolon Rod (referring; Radiation Oncology)   Dr. Baxter Flattery (Urology)   Dr. Nuala Alpha (Anesthesia pain)   Dr. Hoy Morn (Urology, RE: ED)   Dr. 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     Current therapy: Lupron    Oncology history:   - Diagnosed 2008 with prostate cancer, PSA 13.5.   - Prostate biopsy on 04/02/07 w Gleason 3+4=7   - 2009 Radiation therapy to w Dr. Nonie Hoyer 78 Gy.   - 2012-13: Rising PSA   - 11/13: Saw [place], felt not candidate for salvage RT   - 2/14: BS c/w 2009 with suspicious increased uptake at T5, L inferior iliac bone, subtle at L1.   - CT A/P shows sclerosis at L4-5 equivocal for DJD vs metastases, I discussed w radiology, felt most likely metastatic.   - 3/14: Lupron started   - 12/14: Lupron held for intermittent approach  - 11/13/13: Restarted Lupron due to PSA rise with worsening back pain  - 8/15: Palliative RT to spine in [place], [place].  - 05/25/14:  MRI spine: Multilevel degenerative changes, most prominent at L3-4 and L4-L5, where there is marked spinal canal stenosis and crowding with potential indentation of the nerve roots. Severe bilateral neural foraminal narrowing at L4-5 and moderate to marked neuroforaminal narrowing at L3-4 on the right. Additional multilevel degenerative changes as outlined above.    - 08/15/15: Abnormal focal marrow signal and enhancement at the endplates surrounding the L4-5 disc space are favored to represent acutely inflamed Schmorl's nodes. Metastatic disease is felt less likely but is not entirely excluded, particularly given enlargement of the lesion at the L5 level. Continued follow-up recommended.  - 6/17: Bone mineral density: The femoral neck density is mildly low, but the spine and total femoral densities are normal.  - 10/17: L2-S1 decompression surgery w Dr. 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  - 12/17: L ankle pain, negative xray.  - 04/20/18: CT A/P: Stable heterogeneity of the left iliac wing representing known metastatic disease. No new evidence of metastatic disease within the abdomen/pelvis.  - 04/25/18: Bone scan: Increased radiotracer activity within the left iliac bone concerning for osseous metastases.  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  Ongoing back pain w L sciatica.  Hot flashes persist on Lupron, daily.  Nocturia/chronic dysuria: much better with Ditropan 0-1x/night.  No pain. Persistent ED.  Remainder of 10 system ROS otherwise negative.    Physical exam:   There were no vitals taken for this visit.  NAD  A+Ox3  No visible rash  No dyspnea or cough  No apparent neurological deficit    Current Outpatient Medications   Medication Sig Dispense Refill   ??? alendronate (FOSAMAX) 70 MG tablet Take 1 tablet (70 mg total) by mouth every seven (7) days. 4 tablet 11   ??? DULERA 200-5 mcg/actuation HFAA 20 mcg Two (2) times a day.     ??? enzalutamide (XTANDI) 40 mg capsule Take 4 capsules (160 mg) by mouth daily. 120 capsule 11   ??? ezetimibe (ZETIA) 10 mg tablet Take 10 mg by mouth daily. Frequency:QD   Dosage:10   MG  Instructions:  Note:Dose: 10MG      ???  gabapentin (NEURONTIN) 600 MG tablet Take 600 mg by mouth Two (2) times a day.     ??? hydroCHLOROthiazide (HYDRODIURIL) 25 MG tablet Take 1 tablet (25 mg total) by mouth daily. 30 tablet 11   ??? lisinopril (PRINIVIL,ZESTRIL) 40 MG tablet Take 40 mg by mouth daily.      ??? MYRBETRIQ 25 mg Tb24 extended-release tablet TAKE 1 TABLET BY MOUTH ONCE DAILY. 30 tablet 11   ??? omeprazole (PRILOSEC) 20 MG capsule Take 20 mg by mouth daily.     ??? oxybutynin (DITROPAN) 5 MG tablet Take by mouth. 5 mg qAM and 15 mg qPM  0   ??? oxyCODONE (ROXICODONE) 5 MG immediate release tablet 5 mg  in the morning.     ??? predniSONE (DELTASONE) 20 MG tablet 20 mg daily.     ??? pregabalin (LYRICA) 150 MG capsule Take 1 capsule (150 mg total) by mouth three (3) times a day (at 6am, noon and 6pm). 90 capsule 1   ??? sildenafiL (VIAGRA) 100 MG tablet Take 1 tablet (100 mg total) by mouth nightly as needed for erectile dysfunction. 90 tablet 3   ??? tadalafil 20 MG tablet Take 1 tablet (20 mg total) by mouth in the morning. 90 tablet 3   ??? tamsulosin (FLOMAX) 0.4 mg capsule Take 1 capsule (0.4 mg total) by mouth daily. 30 capsule 11   ??? traMADol (ULTRAM) 50 mg tablet Take 50 mg by mouth every six (6) hours as needed for pain.       No current facility-administered medications for this visit. Allergies   Allergen Reactions   ??? Poison [employer]- Rash per patient       Laboratory:   06/27/12: PSA=3 (on Casodex), testosterone >800     Lab Results   Component Value Date    PSA Diagnostic 0.6 05/14/2014    PSA Diagnostic 0.5 02/05/2014    PSA Diagnostic 6.9 (H) 11/06/2013    PSA Diagnostic 2.4 07/24/2013    PSA Diagnostic 0.3 03/20/2013    PSA Diagnostic 0.2 12/26/2012       No visits with results within 2 Week(s) from this visit.   Latest known visit with results is:   Lab on 11/25/2020   Component Date Value Ref Range Status   ??? PSA 11/25/2020 <0.04  0.00 - 4.00 ng/mL Final   ??? Testosterone 11/25/2020 21 (L)  188 - 684 ng/dL Final     Lab Results   Component Value Date    PSA <0.04 11/25/2020    PSA <0.04 08/26/2020    PSA <0.04 05/24/2020    PSA <0.04 12/25/2019    PSA <0.10 09/18/2019    PSA <0.10 06/12/2019     3/17: Thyroid function WNL.

## 2021-03-17 NOTE — Unmapped (Signed)
Pt received lupron 22.5mg  injection in right ventroGluteus. Tolerated it well. Applied guaze and band aid.

## 2021-03-23 NOTE — Unmapped (Signed)
[facility] Refill Coordination Note    Specialty Medication(s) to be Shipped:   Hematology/Oncology: [name]    Other medication(s) to be shipped: No additional medications requested for fill at this time     Calvin Obrien, DOB: [birth year]  Phone: [phone] (home)       All above HIPAA information was verified with patient.     Was a Nurse, learning disability used for this call? No    Completed refill call assessment today to schedule patient's medication shipment from the [facility] [phone]).  All relevant notes have been reviewed.     Specialty medication(s) and dose(s) confirmed: Regimen is correct and unchanged.   Changes to medications: [name] reports no changes at this time.  Changes to insurance: No  New side effects reported not previously addressed with a pharmacist or physician: Yes - Patient reports Inside of ankle is getting numb. Patient would like to speak to the pharmacist today. Their provider is aware.  Questions for the pharmacist: No    Confirmed patient received a Conservation officer, historic buildings and a Surveyor, mining with first shipment. The patient will receive a drug information handout for each medication shipped and additional FDA Medication Guides as required.       DISEASE/MEDICATION-SPECIFIC INFORMATION        N/A    SPECIALTY MEDICATION ADHERENCE     Medication Adherence    Patient reported X missed doses in the last month: 0  Specialty Medication: Xtandi 40 mg  Patient is on additional specialty medications: No  Informant: patient              Were doses missed due to medication being on hold? No    Xtandi 40 mg: 14 days of medicine on hand       REFERRAL TO PHARMACIST     Referral to the pharmacist: Not needed      [facility]     Shipping address confirmed in Epic.     Delivery Scheduled: Yes, Expected medication delivery date: 04/01/21.     Medication will be delivered via UPS to the prescription address in Epic [place].    [name]   [facility] Specialty Technician

## 2021-03-31 MED FILL — XTANDI 40 MG CAPSULE: ORAL | 30 days supply | Qty: 120 | Fill #5

## 2021-04-15 NOTE — Unmapped (Signed)
I saw and evaluated the patient, participating in the key portions of the service.  I reviewed the resident???s note.  I agree with the resident???s findings and plan. Redge Gainer, DO

## 2021-04-25 MED ORDER — MYRBETRIQ 25 MG TABLET,EXTENDED RELEASE
ORAL_TABLET | 6 refills | 0 days | Status: CP
Start: 2021-04-25 — End: ?

## 2021-04-25 NOTE — Unmapped (Signed)
Please refill if appropriate

## 2021-04-26 NOTE — Unmapped (Signed)
[facility] Clinical Assessment & Refill Coordination Note    Calvin Obrien, DOB: 1951-03-21  Phone: [phone] (home)     All above HIPAA information was verified with patient.     Was a Nurse, learning disability used for this call? No    Specialty Medication(s):   Hematology/Oncology: [name]     Current Outpatient Medications   Medication Sig Dispense Refill   ??? alendronate (FOSAMAX) 70 MG tablet Take 1 tablet (70 mg total) by mouth every seven (7) days. 4 tablet 11   ??? DULERA 200-5 mcg/actuation HFAA 20 mcg Two (2) times a day.     ??? enzalutamide (XTANDI) 40 mg capsule Take 4 capsules (160 mg) by mouth daily. 120 capsule 11   ??? ezetimibe (ZETIA) 10 mg tablet Take 10 mg by mouth daily. Frequency:QD   Dosage:10   MG  Instructions:  Note:Dose: 10MG      ??? gabapentin (NEURONTIN) 600 MG tablet Take 600 mg by mouth Two (2) times a day.     ??? hydroCHLOROthiazide (HYDRODIURIL) 25 MG tablet Take 1 tablet (25 mg total) by mouth daily. 30 tablet 11   ??? lisinopril (PRINIVIL,ZESTRIL) 40 MG tablet Take 40 mg by mouth daily.      ??? MYRBETRIQ 25 mg Tb24 extended-release tablet TAKE 1 TABLET BY MOUTH ONCE DAILY. 30 tablet 6   ??? omeprazole (PRILOSEC) 20 MG capsule Take 20 mg by mouth daily.     ??? oxybutynin (DITROPAN) 5 MG tablet Take by mouth. 5 mg qAM and 15 mg qPM  0   ??? oxyCODONE (ROXICODONE) 5 MG immediate release tablet 5 mg  in the morning.     ??? predniSONE (DELTASONE) 20 MG tablet 20 mg daily.     ??? pregabalin (LYRICA) 150 MG capsule Take 1 capsule (150 mg total) by mouth three (3) times a day (at 6am, noon and 6pm). 90 capsule 1   ??? sildenafiL (VIAGRA) 100 MG tablet Take 1 tablet (100 mg total) by mouth nightly as needed for erectile dysfunction. 90 tablet 3   ??? tadalafil 20 MG tablet Take 1 tablet (20 mg total) by mouth in the morning. 90 tablet 3   ??? tamsulosin (FLOMAX) 0.4 mg capsule Take 1 capsule (0.4 mg total) by mouth daily. 30 capsule 11   ??? traMADol (ULTRAM) 50 mg tablet Take 50 mg by mouth every six (6) hours as needed for pain.       No current facility-administered medications for this visit.        Changes to medications: Calvin Obrien reports no changes at this time.    Allergies   Allergen Reactions   ??? Poison [employer]- Rash per patient       Changes to allergies: No    SPECIALTY MEDICATION ADHERENCE     Xtandi 40 mg: 7 days of medicine on hand     Medication Adherence    Patient reported X missed doses in the last month: 0  Specialty Medication: Xtandi 40 mg caps - 4 caps (160 mg) once daily  Patient is on additional specialty medications: No  Informant: patient  Confirmed plan for next specialty medication refill: delivery by pharmacy  Refills needed for supportive medications: not needed          Specialty medication(s) dose(s) confirmed: Regimen is correct and unchanged.     Are there any concerns with adherence? No    Adherence counseling provided? Not needed  CLINICAL MANAGEMENT AND INTERVENTION      Clinical Benefit Assessment:    Do you feel the medicine is effective or helping your condition? Yes    Clinical Benefit counseling provided? Not needed    Adverse Effects Assessment:    Are you experiencing any side effects? Yes, patient reports experiencing numbing feeling on his right hand (top left portiion).  Reports started about 1 month ago.  Denies starting new medications. . Side effect counseling provided: Not likely due to [facility].  He has an appointment with his PCP next month and will discuss with them.  He is aware to go see provider if the numbing intensifies or he starts noticing it in other areas.    Are you experiencing difficulty administering your medicine? No    Quality of Life Assessment:    Quality of Life    Rheumatology  Oncology  2. On a scale of 1-10, how would you rate your ability to manage side effects associated with your specialty medication? (1=no issues, 10 = unable to take medication due to side effects): 2  Dermatology  Cystic Fibrosis          How many days over the past month did your prostate cancer  keep you from your normal activities? For example, brushing your teeth or getting up in the morning. 0    Have you discussed this with your provider? Not needed    Acute Infection Status:    Acute infections noted within Epic:  No active infections  Patient reported infection: None    Therapy Appropriateness:    Is therapy appropriate and patient progressing towards therapeutic goals? Yes, therapy is appropriate and should be continued    DISEASE/MEDICATION-SPECIFIC INFORMATION      N/A    PATIENT SPECIFIC NEEDS     - Does the patient have any physical, cognitive, or cultural barriers? No    - Is the patient high risk? Yes, patient is taking oral chemotherapy. Appropriateness of therapy as been assessed    - Does the patient require a Care Management Plan? No     SOCIAL DETERMINANTS OF HEALTH     At the [facility], we have learned that life circumstances - like trouble affording food, housing, utilities, or transportation can affect the health of many of our patients.   That is why we wanted to ask: are you currently experiencing any life circumstances that are negatively impacting your health and/or quality of life? Patient declined to answer    Social Determinants of Health     Food Insecurity: Not on file   Tobacco Use: High Risk   ??? Smoking Tobacco Use: Some Days   ??? Smokeless Tobacco Use: Never   ??? Passive Exposure: Not on file   Transportation Needs: Not on file   Alcohol Use: Not on file   Housing/Utilities: Not on file   Substance Use: Not on file   Financial Resource Strain: Not on file   Physical Activity: Not on file   Health Literacy: Not on file   Stress: Not on file   Intimate Partner Violence: Not on file   Depression: Not on file   Social Connections: Not on file       Would you be willing to receive help with any of the needs that you have identified today? Not applicable       SHIPPING     Specialty Medication(s) to be Shipped: Hematology/Oncology: [name]    Other medication(s) to be shipped:  No additional medications requested for fill at this time     Changes to insurance: No    Delivery Scheduled: Yes, Expected medication delivery date: 05/03/21.     Medication will be delivered via UPS to the confirmed prescription address in [facility].    The patient will receive a drug information handout for each medication shipped and additional FDA Medication Guides as required.  Verified that patient has previously received a Conservation officer, historic buildings and a Surveyor, mining.    The patient or caregiver noted above participated in the development of this care plan and knows that they can request review of or adjustments to the care plan at any time.      All of the patient's questions and concerns have been addressed.    [name] [facility] Specialty Pharmacist

## 2021-05-02 MED FILL — XTANDI 40 MG CAPSULE: ORAL | 30 days supply | Qty: 120 | Fill #6

## 2021-05-31 NOTE — Unmapped (Signed)
[facility] Pharm[facility] Refill Coordination Note    Specialty Medication(s) to be Shipped:   Hematology/Oncology: Calvin Obrien    Other medication(s) to be shipped: No additional medications requested for fill at this time     Calvin Obrien, DOB: 1950-05-19  Phone: [phone] (home)       All above HIPAA information was verified with patient.     Was a Nurse, learning disability used for this call? No    Completed refill call assessment today to schedule patient's medication shipment from the [facility] [phone]).  All relevant notes have been reviewed.     Specialty medication(s) and dose(s) confirmed: Regimen is correct and unchanged.   Changes to medications: Calvin Obrien reports no changes at this time.  Changes to insurance: No  New side effects reported not previously addressed with a pharmacist or physician: None reported  Questions for the pharmacist: No    Confirmed patient received a Conservation officer, historic buildings and a Surveyor, mining with first shipment. The patient will receive a drug information handout for each medication shipped and additional FDA Medication Guides as required.       DISEASE/MEDICATION-SPECIFIC INFORMATION        N/A    SPECIALTY MEDICATION ADHERENCE     Medication Adherence    Patient reported X missed doses in the last month: 0  Specialty Medication: Xtandi 40 mg  Patient is on additional specialty medications: No  Informant: patient              Were doses missed due to medication being on hold? No    Xtandi 40 mg: 7 days of medicine on hand       REFERRAL TO PHARMACIST     Referral to the pharmacist: Not needed      [facility]     Shipping address confirmed in Epic.     Delivery Scheduled: Yes, Expected medication delivery date: 06/07/21.     Medication will be delivered via UPS to the prescription address in Epic [place].    [name]   [facility] Specialty Technician

## 2021-06-06 MED FILL — XTANDI 40 MG CAPSULE: ORAL | 30 days supply | Qty: 120 | Fill #7

## 2021-06-30 ENCOUNTER — Ambulatory Visit: Admit: 2021-06-30 | Discharge: 2021-07-01 | Payer: MEDICARE | Attending: Medical Oncology | Primary: Medical Oncology

## 2021-06-30 ENCOUNTER — Other Ambulatory Visit: Admit: 2021-06-30 | Discharge: 2021-07-01 | Payer: MEDICARE

## 2021-06-30 DIAGNOSIS — C61 Malignant neoplasm of prostate: Principal | ICD-10-CM

## 2021-06-30 DIAGNOSIS — J9601 Acute respiratory failure with hypoxia: Principal | ICD-10-CM

## 2021-06-30 DIAGNOSIS — C7951 Secondary malignant neoplasm of bone: Principal | ICD-10-CM

## 2021-06-30 DIAGNOSIS — F1121 Opioid dependence, in remission: Principal | ICD-10-CM

## 2021-06-30 LAB — COMPREHENSIVE METABOLIC PANEL
ALBUMIN: 4 g/dL (ref 3.4–5.0)
ALKALINE PHOSPHATASE: 77 U/L (ref 46–116)
ALT (SGPT): 9 U/L — ABNORMAL LOW (ref 10–49)
ANION GAP: 9 mmol/L (ref 5–14)
BILIRUBIN TOTAL: 0.7 mg/dL (ref 0.3–1.2)
BLOOD UREA NITROGEN: 18 mg/dL (ref 9–23)
BUN / CREAT RATIO: 17
CALCIUM: 9.3 mg/dL (ref 8.7–10.4)
CHLORIDE: 106 mmol/L (ref 98–107)
CO2: 23 mmol/L (ref 20.0–31.0)
CREATININE: 1.09 mg/dL
EGFR CKD-EPI (2021) MALE: 72 mL/min/{1.73_m2} (ref >=60)
GLUCOSE RANDOM: 98 mg/dL (ref 70–179)
PROTEIN TOTAL: 7.2 g/dL (ref 5.7–8.2)
SODIUM: 138 mmol/L (ref 135–145)

## 2021-06-30 LAB — TESTOSTERONE: TESTOSTERONE TOTAL: 29 ng/dL — ABNORMAL LOW

## 2021-06-30 LAB — PSA: PROSTATE SPECIFIC ANTIGEN: <0.04 ng/mL (ref 0.00–4.00)

## 2021-06-30 MED ORDER — ENZALUTAMIDE 40 MG TABLET
ORAL_TABLET | Freq: Every day | ORAL | 11 refills | 30 days | Status: CP
Start: 2021-06-30 — End: 2021-07-30
  Filled 2021-07-19: qty 120, 30d supply, fill #0

## 2021-06-30 MED ADMIN — leuprolide (LUPRON) injection 22.5 mg: 22.5 mg | INTRAMUSCULAR | @ 16:00:00 | Stop: 2021-06-30

## 2021-06-30 NOTE — Unmapped (Signed)
[facility] received a new prescription for Xtandi 40 mg tablets for [name].  Patient is aware of change from capsules to tablets due to difficulty swallowing large capsules or tablets.  The 40 mg tablets are smaller and easier for the patient to swallow.  [facility] will test claim prescription and confirm insurance approval prior to contacting patient to schedule a delivery.    Horace Porteous, PharmD  [facility]      Addendum 07/15/21: [facility] has attempted to contact Calvin Obrien regarding refill assessment for Xtandi 40 mg tablets on 06/27/21 and 07/15/21.  We have tried both numbers on file and have not been able to reach him.  Horace Porteous, PharmD  [facility]

## 2021-06-30 NOTE — Unmapped (Signed)
Sent over this request via epic chat to nursing staff and nurse navigator since patient is already in the clinic.

## 2021-06-30 NOTE — Unmapped (Signed)
Pt received lupron 22.5mg  injection in Left ventroGluteus. Tolerated it well. Applied guaze and band aid. Marland Kitchen

## 2021-06-30 NOTE — Unmapped (Signed)
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  After adding enzalutamide, PSA dropped to non-detectable.    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  He is followed in a pain clinic and has seen Palliative Care.    He is feeling very well with good energy.    Based on PSA values and how he is feeling, we will continue current therapy with Lupron and enzalutamide.    Lab Results   Component Value Date    PSA <0.04 06/30/2021    PSA <0.04 03/17/2021    PSA <0.04 11/25/2020    PSA <0.04 08/26/2020    PSA <0.04 05/24/2020    PSA <0.04 12/25/2019       Plan:   - Continue 67-month Lupron 22.5mg  (06/30/21).  (I tried to change to 25-month injection but insurance refused this apparently.)  - Pain - has been managed by palliative care.  - Continue enzalutamide - switch to 40mg  tablets because he is mechanically unable to swallow the larger tablets or capsules at this point.  - He was previously referred to [facility] for ED assessment (and can also discuss urinary leakage).  - Follow PSA.  - He will follow up with his pain clinic and with palliative Care here as warranted.  - He will meet about gas money today to help him out.  - Urinary leakage: He has seen Calvin Bruns, NP for evaluation. I recommended pads.  - BMD scan was done 11/25/20 with low bone density - started Fosamax.  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  - For ED, has seen Calvin Obrien in the past.  Viagra does not help.  He has spoken with Urology about options.    - Spine: He had prior decompression, now doing back exercises with substantial improvement of symptoms.  - Hot flashes - on Gabapentin 300 TID, Calvin Obrien has counseled, pharmacy seeing today again. Stable  -   Code Status: Prior     I spent 43 minutes on records review, meeting with the patient, documentation and coordination on the day of service.    RTC in 3 months for next Lupron, to see if he received the 40mg  smaller enzalutamide tablets, and to check health status.      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     Current therapy: Lupron    Oncology history:   - Diagnosed 2008 with prostate cancer, PSA 13.5.   - Prostate biopsy on 04/02/07 w Gleason 3+4=7   - 2009 Radiation therapy to w Dr. Nonie Obrien 78 Gy.   - 2012-13: Rising PSA   - 11/13: Saw Calvin Obrien rad onc, felt not candidate for salvage  RT   - 2/14: BS c/w 2009 with suspicious increased uptake at T5, L inferior iliac bone, subtle at L1.   - CT A/P shows sclerosis at L4-5 equivocal for DJD vs metastases, I discussed w radiology, felt most likely metastatic.   - 3/14: Lupron started   - 12/14: Lupron held for intermittent approach  - 11/13/13: Restarted Lupron due to PSA rise with worsening back pain  - 8/15: Palliative RT to spine in Calvin Obrien, Calvin Obrien.  - 05/25/14:  MRI spine: Multilevel degenerative changes, most prominent at L3-4 and L4-L5, where there is marked spinal canal stenosis and crowding with potential indentation of the nerve roots. Severe bilateral neural foraminal narrowing at L4-5 and moderate to marked neuroforaminal narrowing at L3-4 on the right. Additional multilevel degenerative changes as outlined above.    - 08/15/15: Abnormal focal marrow signal and enhancement at the endplates surrounding the L4-5 disc space are favored to represent acutely inflamed Schmorl's nodes. Metastatic disease is felt less likely but is not entirely excluded, particularly given enlargement of the lesion at the L5 level. Continued follow-up recommended.  - 6/17: Bone mineral density: The femoral neck density is mildly low, but the spine and total femoral densities are normal.  - 10/17: L2-S1 decompression surgery w Calvin Obrien, postoperative alcohol withdrawal and patient subsequently states he quit drinking.  - 12/17: L ankle pain, negative xray.  - 04/20/18: CT A/P: Stable heterogeneity of the left iliac wing representing known metastatic disease. No new evidence of metastatic disease within the abdomen/pelvis.  - 04/25/18: Bone scan: Increased radiotracer activity within the left iliac bone concerning for osseous metastases.  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  Ongoing back pain w L sciatica.  Hot flashes persist on Lupron, daily.  Nocturia/chronic dysuria: much better with Ditropan 0-1x/night.  No pain.  Persistent ED.  Remainder of 10 system ROS otherwise negative.    Physical exam:   BP 138/65  - Pulse 56  - Temp 36.6 ??C (97.9 ??F) (Oral)  - Resp 18  - Ht 170.2 cm (5' 7)  - Wt 82.7 kg (182 lb 6.4 oz)  - SpO2 100%  - BMI 28.57 kg/m??   NAD  A+Ox3  No visible rash  No dyspnea or cough  No apparent neurological deficit    Current Outpatient Medications   Medication Sig Dispense Refill    alendronate (FOSAMAX) 70 MG tablet Take 1 tablet (70 mg total) by mouth every seven (7) days. 4 tablet 11    DULERA 200-5 mcg/actuation HFAA 20 mcg Two (2) times a day.      enzalutamide (XTANDI) 40 mg capsule Take 4 capsules (160 mg) by mouth daily. 120 capsule 11    ezetimibe (ZETIA) 10 mg tablet Take 1 tablet (10 mg total) by mouth daily. Frequency:QD   Dosage:10   MG  Instructions:  Note:Dose: 10MG       gabapentin (NEURONTIN) 600 MG tablet Take 1 tablet (600 mg total) by mouth Two (2) times a day.      hydroCHLOROthiazide (HYDRODIURIL) 25 MG tablet Take 1 tablet (25 mg total) by mouth daily. 30 tablet 11    lisinopril (PRINIVIL,ZESTRIL) 40 MG tablet Take 1 tablet (40 mg total) by mouth daily.      MYRBETRIQ 25 mg Tb24 extended-release tablet TAKE 1 TABLET BY MOUTH ONCE DAILY. 30 tablet 6    omeprazole (  PRILOSEC) 20 MG capsule Take 1 capsule (20 mg total) by mouth daily.      oxybutynin (DITROPAN) 5 MG tablet Take by mouth. 5 mg qAM and 15 mg qPM  0    oxyCODONE (ROXICODONE) 5 MG immediate release tablet 1 tablet (5 mg total)  in the morning.      predniSONE (DELTASONE) 20 MG tablet 1 tablet (20 mg total)  in the morning.      pregabalin (LYRICA) 150 MG capsule Take 1 capsule (150 mg total) by mouth three (3) times a day (at 6am, noon and 6pm). 90 capsule 1    sildenafiL (VIAGRA) 100 MG tablet Take 1 tablet (100 mg total) by mouth nightly as needed for erectile dysfunction. 90 tablet 3    tadalafil 20 MG tablet Take 1 tablet (20 mg total) by mouth in the morning. 90 tablet 3    tamsulosin (FLOMAX) 0.4 mg capsule Take 1 capsule (0.4 mg total) by mouth daily. 30 capsule 11    traMADol (ULTRAM) 50 mg tablet Take 1 tablet (50 mg total) by mouth every six (6) hours as needed for pain.       No current facility-administered medications for this visit.       Allergies   Allergen Reactions    Poison Ivy Extract      Poison Ivy- Rash per patient       Laboratory:   06/27/12: PSA=3 (on Casodex), testosterone >800     Lab Results   Component Value Date    PSA Diagnostic 0.6 05/14/2014    PSA Diagnostic 0.5 02/05/2014    PSA Diagnostic 6.9 (H) 11/06/2013    PSA Diagnostic 2.4 07/24/2013    PSA Diagnostic 0.3 03/20/2013    PSA Diagnostic 0.2 12/26/2012       Appointment on 06/30/2021   Component Date Value Ref Range Status    PSA 06/30/2021 <0.04  0.00 - 4.00 ng/mL Final    Sodium 06/30/2021 138  135 - 145 mmol/L Final    Potassium 06/30/2021    Final    Specimen Hemolyzed    Chloride 06/30/2021 106  98 - 107 mmol/L Final    CO2 06/30/2021 23.0  20.0 - 31.0 mmol/L Final    Anion Gap 06/30/2021 9  5 - 14 mmol/L Final    BUN 06/30/2021 18  9 - 23 mg/dL Final    Creatinine 16/01/9603 1.09  0.60 - 1.10 mg/dL Final    BUN/Creatinine Ratio 06/30/2021 17   Final    eGFR CKD-EPI (2021) Male 06/30/2021 72  >=60 mL/min/1.22m2 Final    eGFR calculated with CKD-EPI 2021 equation in accordance with Calvin Obrien.    Glucose 06/30/2021 98  70 - 179 mg/dL Final    Calcium 54/12/8117 9.3  8.7 - 10.4 mg/dL Final    Albumin 14/78/2956 4.0  3.4 - 5.0 g/dL Final    Total Protein 06/30/2021 7.2  5.7 - 8.2 g/dL Final    Total Bilirubin 06/30/2021 0.7  0.3 - 1.2 mg/dL Final    AST 21/30/8657    Final    Specimen Hemolyzed    ALT 06/30/2021 9 (L)  10 - 49 U/L Final    Alkaline Phosphatase 06/30/2021 77  46 - 116 U/L Final     Lab Results   Component Value Date    PSA <0.04 06/30/2021    PSA <0.04 03/17/2021    PSA <0.04 11/25/2020    PSA <  0.04 08/26/2020    PSA <0.04 05/24/2020    PSA <0.04 12/25/2019     3/17: Thyroid function WNL.

## 2021-06-30 NOTE — Unmapped (Signed)
Hi,     Shell w/transportation contacted the Communication Center requesting to speak with the care team of KOEN ANTILLA to discuss:    Please call Shell when Mr. Tat is done with his appt this morning. He left his phone in the car so he can call Shell or his phone.    Please contact Shell at [phone].    Thank you,   Yehuda Mao  [facility]   [phone]

## 2021-07-01 NOTE — Unmapped (Signed)
Clinical Assessment Needed For: Formulation Change  Medication: Vinnie Langton Date/Day Supply: 06/06/21 / 30  Copay $0  Was previous dose already scheduled to fill: No    Notes to Pharmacist: Trouble swallowing please keep 40 mg tablets as they are the smallest

## 2021-07-19 NOTE — Unmapped (Signed)
[facility] Clinical Assessment & Refill Coordination Note    Calvin Obrien, DOB: May 14, 1950  Phone: [phone] (home)     All above HIPAA information was verified with patient.     Was a Nurse, learning disability used for this call? No    Specialty Medication(s):   Hematology/Oncology: [name]     Current Outpatient Medications   Medication Sig Dispense Refill    alendronate (FOSAMAX) 70 MG tablet Take 1 tablet (70 mg total) by mouth every seven (7) days. 4 tablet 11    DULERA 200-5 mcg/actuation HFAA 20 mcg Two (2) times a day.      enzalutamide (XTANDI) 40 mg tablet Take 4 tablets (160 mg total) by mouth daily. Swallow tablets whole. Do not chew, crush or split tablets. 120 tablet 11    ezetimibe (ZETIA) 10 mg tablet Take 1 tablet (10 mg total) by mouth daily. Frequency:QD   Dosage:10   MG  Instructions:  Note:Dose: 10MG       gabapentin (NEURONTIN) 600 MG tablet Take 1 tablet (600 mg total) by mouth Two (2) times a day.      hydroCHLOROthiazide (HYDRODIURIL) 25 MG tablet Take 1 tablet (25 mg total) by mouth daily. 30 tablet 11    lisinopril (PRINIVIL,ZESTRIL) 40 MG tablet Take 1 tablet (40 mg total) by mouth daily.      MYRBETRIQ 25 mg Tb24 extended-release tablet TAKE 1 TABLET BY MOUTH ONCE DAILY. 30 tablet 6    omeprazole (PRILOSEC) 20 MG capsule Take 1 capsule (20 mg total) by mouth daily.      oxybutynin (DITROPAN) 5 MG tablet Take by mouth. 5 mg qAM and 15 mg qPM  0    oxyCODONE (ROXICODONE) 5 MG immediate release tablet 1 tablet (5 mg total)  in the morning.      predniSONE (DELTASONE) 20 MG tablet 1 tablet (20 mg total)  in the morning.      pregabalin (LYRICA) 150 MG capsule Take 1 capsule (150 mg total) by mouth three (3) times a day (at 6am, noon and 6pm). 90 capsule 1    sildenafiL (VIAGRA) 100 MG tablet Take 1 tablet (100 mg total) by mouth nightly as needed for erectile dysfunction. 90 tablet 3    tadalafil 20 MG tablet Take 1 tablet (20 mg total) by mouth in the morning. 90 tablet 3    tamsulosin (FLOMAX) 0.4 mg capsule Take 1 capsule (0.4 mg total) by mouth daily. 30 capsule 11    traMADol (ULTRAM) 50 mg tablet Take 1 tablet (50 mg total) by mouth every six (6) hours as needed for pain.       No current facility-administered medications for this visit.        Changes to medications: Calvin Obrien reports no changes at this time.    Allergies   Allergen Reactions    Poison Ivy Extract      Poison Ivy- Rash per patient       Changes to allergies: No    SPECIALTY MEDICATION ADHERENCE     Xtandi 40 mg: 0 days of medicine on hand     Medication Adherence    Patient reported X missed doses in the last month: >5  Specialty Medication: Xtandi 40 mg tablets - change from capsules to tablets  Patient is on additional specialty medications: No  Informant: patient  Confirmed plan for next specialty medication refill: delivery by pharmacy          Specialty medication(s) dose(s) confirmed:  Now taking tablets instead of capsules.  He was having a difficult time swallowing the capsules      Are there any concerns with adherence?  Has not taken Xtandi since July 08, 2021 due to difficulty swallowing capsules.  [facility] unable to contact patient until today to schedule delivery of tablets     Adherence counseling provided? Not needed    CLINICAL MANAGEMENT AND INTERVENTION      Clinical Benefit Assessment:    Do you feel the medicine is effective or helping your condition? Yes    Clinical Benefit counseling provided? Not needed    Adverse Effects Assessment:    Are you experiencing any side effects? No    Are you experiencing difficulty administering your medicine? No    Quality of Life Assessment:    Quality of Life    Rheumatology  Oncology  Dermatology  Cystic Fibrosis          How many days over the past month did your prostate cancer  keep you from your normal activities? For example, brushing your teeth or getting up in the morning. 0    Have you discussed this with your provider? Not needed    Acute Infection Status:    Acute infections noted within Epic:  No active infections  Patient reported infection: None    Therapy Appropriateness:    Is therapy appropriate and patient progressing towards therapeutic goals? Yes, therapy is appropriate and should be continued    DISEASE/MEDICATION-SPECIFIC INFORMATION      N/A    PATIENT SPECIFIC NEEDS     Does the patient have any physical, cognitive, or cultural barriers? No    Is the patient high risk? Yes, patient is taking oral chemotherapy. Appropriateness of therapy as been assessed    Does the patient require a Care Management Plan? No     SOCIAL DETERMINANTS OF HEALTH     At the [facility], we have learned that life circumstances - like trouble affording food, housing, utilities, or transportation can affect the health of many of our patients.   That is why we wanted to ask: are you currently experiencing any life circumstances that are negatively impacting your health and/or quality of life? Patient declined to answer    Social Determinants of Health     Financial Resource Strain: Not on file   Internet Connectivity: Not on file   Food Insecurity: Not on file   Tobacco Use: High Risk    Smoking Tobacco Use: Some Days    Smokeless Tobacco Use: Never    Passive Exposure: Not on file   Housing/Utilities: Not on file   Alcohol Use: Not on file   Transportation Needs: Not on file   Substance Use: Not on file   Health Literacy: Not on file   Physical Activity: Not on file   Interpersonal Safety: Not on file   Stress: Not on file   Intimate Partner Violence: Not on file   Depression: Not on file   Social Connections: Not on file       Would you be willing to receive help with any of the needs that you have identified today? Not applicable       SHIPPING     Specialty Medication(s) to be Shipped:   Hematology/Oncology: [name]    Other medication(s) to be shipped: No additional medications requested for fill at this time     Changes to insurance: No    Delivery Scheduled: Yes, Expected medication  delivery date: 07/20/21.     Medication will be delivered via UPS to the confirmed prescription address in [facility].    The patient will receive a drug information handout for each medication shipped and additional FDA Medication Guides as required.  Verified that patient has previously received a Conservation officer, historic buildings and a Surveyor, mining.    The patient or caregiver noted above participated in the development of this care plan and knows that they can request review of or adjustments to the care plan at any time.      All of the patient's questions and concerns have been addressed.    [name] Shared [facility] Specialty Pharmacist

## 2021-08-18 NOTE — Unmapped (Signed)
The [facility] has made a second and final attempt to reach this patient to refill the following medication:Xtandi.      We have been unable to leave messages on the following phone numbers: [phone], have sent a MyChart message, and have sent a text message to the following phone numbers: [phone] .    Dates contacted: 5/4,10  Last scheduled delivery: 07/19/21    The patient may be at risk of non-compliance with this medication. The patient should call the [facility] at [phone]  Option 4, then Option 1 (oncology) to refill medication.    [name] Margretta Ditty Shared [facility] Specialty Technician  09/27/21

## 2021-08-22 MED FILL — XTANDI 40 MG TABLET: ORAL | 30 days supply | Qty: 120 | Fill #1

## 2021-08-22 NOTE — Unmapped (Signed)
[facility] Clinical Assessment & Refill Coordination Note    Calvin Obrien, DOB: [birth year]  Phone: [phone] (home)     All above HIPAA information was verified with patient.     Was a Nurse, learning disability used for this call? No    Specialty Medication(s):   Hematology/Oncology: [name]     Current Outpatient Medications   Medication Sig Dispense Refill    alendronate (FOSAMAX) 70 MG tablet Take 1 tablet (70 mg total) by mouth every seven (7) days. 4 tablet 11    DULERA 200-5 mcg/actuation HFAA 20 mcg Two (2) times a day.      enzalutamide (XTANDI) 40 mg tablet Take 4 tablets (160 mg total) by mouth daily. Swallow tablets whole. Do not chew, crush or split tablets. 120 tablet 11    ezetimibe (ZETIA) 10 mg tablet Take 1 tablet (10 mg total) by mouth daily. Frequency:QD   Dosage:10   MG  Instructions:  Note:Dose: 10MG       gabapentin (NEURONTIN) 600 MG tablet Take 1 tablet (600 mg total) by mouth Two (2) times a day.      hydroCHLOROthiazide (HYDRODIURIL) 25 MG tablet Take 1 tablet (25 mg total) by mouth daily. 30 tablet 11    lisinopril (PRINIVIL,ZESTRIL) 40 MG tablet Take 1 tablet (40 mg total) by mouth daily.      MYRBETRIQ 25 mg Tb24 extended-release tablet TAKE 1 TABLET BY MOUTH ONCE DAILY. 30 tablet 6    omeprazole (PRILOSEC) 20 MG capsule Take 1 capsule (20 mg total) by mouth daily.      oxybutynin (DITROPAN) 5 MG tablet Take by mouth. 5 mg qAM and 15 mg qPM  0    oxyCODONE (ROXICODONE) 5 MG immediate release tablet 1 tablet (5 mg total)  in the morning.      predniSONE (DELTASONE) 20 MG tablet 1 tablet (20 mg total)  in the morning.      pregabalin (LYRICA) 150 MG capsule Take 1 capsule (150 mg total) by mouth three (3) times a day (at 6am, noon and 6pm). 90 capsule 1    sildenafiL (VIAGRA) 100 MG tablet Take 1 tablet (100 mg total) by mouth nightly as needed for erectile dysfunction. 90 tablet 3    tadalafil 20 MG tablet Take 1 tablet (20 mg total) by mouth in the morning. 90 tablet 3    tamsulosin (FLOMAX) 0.4 mg capsule Take 1 capsule (0.4 mg total) by mouth daily. 30 capsule 11    traMADol (ULTRAM) 50 mg tablet Take 1 tablet (50 mg total) by mouth every six (6) hours as needed for pain.       No current facility-administered medications for this visit.        Changes to medications: Mecca reports no changes at this time.    Allergies   Allergen Reactions    Poison Ivy Extract      Poison Ivy- Rash per patient       Changes to allergies: No    SPECIALTY MEDICATION ADHERENCE     Xtandi 40 mg: 0 days of medicine on hand     Medication Adherence    Patient reported X missed doses in the last month: 1-2  Specialty Medication: xtandi 40 mg 4 tab (160 mg) once daily  Informant: patient  Confirmed plan for next specialty medication refill: delivery by pharmacy  Refills needed for supportive medications: not needed          Specialty medication(s) dose(s) confirmed: Regimen is  correct and unchanged.     Are there any concerns with adherence?  No - pt ran out of meds on 08/20/21 and missed 2 doses due to no insurance on file.    Adherence counseling provided? Not needed    CLINICAL MANAGEMENT AND INTERVENTION      Clinical Benefit Assessment:    Do you feel the medicine is effective or helping your condition? Yes    Clinical Benefit counseling provided? Not needed    Adverse Effects Assessment:    Are you experiencing any side effects? No    Are you experiencing difficulty administering your medicine? No    Quality of Life Assessment:    Quality of Life    Rheumatology  Oncology  Dermatology  Cystic Fibrosis          How many days over the past month did your prostate cancer  keep you from your normal activities? For example, brushing your teeth or getting up in the morning. 0    Have you discussed this with your provider? Not needed    Acute Infection Status:    Acute infections noted within Epic:  No active infections  Patient reported infection: None    Therapy Appropriateness:    Is therapy appropriate and patient progressing towards therapeutic goals? Yes, therapy is appropriate and should be continued    DISEASE/MEDICATION-SPECIFIC INFORMATION      N/A    PATIENT SPECIFIC NEEDS     Does the patient have any physical, cognitive, or cultural barriers? No    Is the patient high risk? Yes, patient is taking oral chemotherapy. Appropriateness of therapy as been assessed    Does the patient require a Care Management Plan? No     SOCIAL DETERMINANTS OF HEALTH     At the [facility], we have learned that life circumstances - like trouble affording food, housing, utilities, or transportation can affect the health of many of our patients.   That is why we wanted to ask: are you currently experiencing any life circumstances that are negatively impacting your health and/or quality of life? No    Social Determinants of Psychologist, prison and probation services Strain: Not on file   Internet Connectivity: Not on file   Food Insecurity: Not on file   Tobacco Use: High Risk    Smoking Tobacco Use: Some Days    Smokeless Tobacco Use: Never    Passive Exposure: Not on file   Housing/Utilities: Not on file   Alcohol Use: Not on file   Transportation Needs: Not on file   Substance Use: Not on file   Health Literacy: Not on file   Physical Activity: Not on file   Interpersonal Safety: Not on file   Stress: Not on file   Intimate Partner Violence: Not on file   Depression: Not on file   Social Connections: Not on file       Would you be willing to receive help with any of the needs that you have identified today? Not applicable       SHIPPING     Specialty Medication(s) to be Shipped:   Hematology/Oncology: [name]    Other medication(s) to be shipped: No additional medications requested for fill at this time     Changes to insurance: Yes: new insurance information updated $0 copay    Delivery Scheduled: Yes, Expected medication delivery date: 08/23/21.     Medication will be delivered via UPS to the confirmed prescription address in  Epic [facility].    The patient will receive a drug information handout for each medication shipped and additional FDA Medication Guides as required.  Verified that patient has previously received a Conservation officer, historic buildings and a Surveyor, mining.    The patient or caregiver noted above participated in the development of this care plan and knows that they can request review of or adjustments to the care plan at any time.      All of the patient's questions and concerns have been addressed.    [name] Shared [facility] Specialty Pharmacist

## 2021-09-13 ENCOUNTER — Telehealth: Payer: Self-pay | Admitting: Orthopedic Surgery

## 2021-09-13 NOTE — Telephone Encounter (Signed)
Pt called requesting a call back from Dr. Marlou Sa. Pt states the shoulder he had surgery on the knot has come back. Pt is need ing Dr. Marlou Sa to call him at [phone].

## 2021-09-13 NOTE — Telephone Encounter (Signed)
Tried calling-went straight to VM. Unable to LM  VM is full

## 2021-09-14 NOTE — Telephone Encounter (Signed)
Tried calling-went straight to VM. Unable to LM  VM is full

## 2021-09-22 MED FILL — XTANDI 40 MG TABLET: ORAL | 30 days supply | Qty: 120 | Fill #2

## 2021-09-22 NOTE — Unmapped (Signed)
[facility] Refill Coordination Note    Specialty Medication(s) to be Shipped:   Hematology/Oncology: Calvin Obrien    Other medication(s) to be shipped: No additional medications requested for fill at this time     Calvin Obrien, DOB: 1950/08/02  Phone: [phone] (home)       All above HIPAA information was verified with patient.     Was a Nurse, learning disability used for this call? No    Completed refill call assessment today to schedule patient's medication shipment from the [facility] [phone]).  All relevant notes have been reviewed.     Specialty medication(s) and dose(s) confirmed: Regimen is correct and unchanged.   Changes to medications: Calvin Obrien reports no changes at this time.  Changes to insurance: No  New side effects reported not previously addressed with a pharmacist or physician: None reported  Questions for the pharmacist: No    Confirmed patient received a Conservation officer, historic buildings and a Surveyor, mining with first shipment. The patient will receive a drug information handout for each medication shipped and additional FDA Medication Guides as required.       DISEASE/MEDICATION-SPECIFIC INFORMATION        N/A    SPECIALTY MEDICATION ADHERENCE     Medication Adherence    Patient reported X missed doses in the last month: 0  Specialty Medication: Xtandi 40 mg  Patient is on additional specialty medications: No  Informant: patient          Were doses missed due to medication being on hold? No    Xtandi 40 mg: 1 days of medicine on hand       REFERRAL TO PHARMACIST     Referral to the pharmacist: Not needed      [facility]     Shipping address confirmed in Epic.     Delivery Scheduled: Yes, Expected medication delivery date: 09/23/21.     Medication will be delivered via UPS to the prescription address in Epic [place].    Calvin Obrien [name]   [facility] Specialty Technician

## 2021-10-12 ENCOUNTER — Ambulatory Visit (INDEPENDENT_AMBULATORY_CARE_PROVIDER_SITE_OTHER): Payer: Medicare Other | Admitting: Orthopedic Surgery

## 2021-10-12 ENCOUNTER — Ambulatory Visit (INDEPENDENT_AMBULATORY_CARE_PROVIDER_SITE_OTHER): Payer: Medicare Other

## 2021-10-12 DIAGNOSIS — Z96612 Presence of left artificial shoulder joint: Secondary | ICD-10-CM

## 2021-10-12 DIAGNOSIS — M25512 Pain in left shoulder: Secondary | ICD-10-CM

## 2021-10-12 DIAGNOSIS — M12812 Other specific arthropathies, not elsewhere classified, left shoulder: Secondary | ICD-10-CM

## 2021-10-12 NOTE — Progress Notes (Signed)
Office Visit Note   Patient: Willie Fowler           Date of Birth: 03-19-1951           MRN: [id number] Visit Date: 10/12/2021 Requested by: Abran Richard, MD [address] [place],  [place] [address] PCP: Abran Richard, MD  Subjective: Chief Complaint  Patient presents with   Left Shoulder - Pain    HPI: Willie Fowler is a 71 y.o. male who presents to the office complaining of left shoulder pain.  Patient has history of left shoulder reverse shoulder arthroplasty that was done in September 2019 by Dr. Marlou Sa.  Did well from the surgery for a while but has had increased pain in the last 3 to 4 months.  Denies any history of injury.  Complains of primarily lateral shoulder pain with no radiation of pain.  No lifting events.  He is retired and does not put a lot of stress on his shoulder.  Enjoys watching TV.  No recent injury or hospitalization.  Denies any fevers, chills, night sweats, drainage from the incision.  No neck pain, scapular pain, numbness or tingling.  No history of diabetes.  Occasionally smokes cigarettes.  Undergoing treatment for prostate cancer and has a history of colon cancer.  Does note weakness with trying to lift the arm or lifting things with the arm.  Also notes a "knot" is noticeable on his shoulder when he lifts his shoulder up.              ROS: All systems reviewed are negative as they relate to the chief complaint within the history of present illness.  Patient denies fevers or chills.  Assessment & Plan: Visit Diagnoses:  1. Status post reverse arthroplasty of shoulder, left   2. Rotator cuff arthropathy, left   3. Left shoulder pain, unspecified chronicity     Plan: Patient is a 71 year old male who presents for evaluation of left shoulder pain.  He has had increased pain over the last 3 to 4 months with no new radiographic changes on x-rays compared with prior radiographs from 2020.  No sign or symptoms of infection.  Most of his pain seems to be  localized to the acromion on his exam today.  Concern for acromial stress fracture versus loosening.  Order CT scan of the left shoulder for further evaluation.  Also tried to obtain lab work for ESR, CRP, CBC with differential today but patient refused due to fear of needles.  Follow-up after CT scan to review results.   Follow-Up Instructions: No follow-ups on file.   Orders:  Orders Placed This Encounter  Procedures   XR Shoulder Left   CT SHOULDER LEFT WO CONTRAST   No orders of the defined types were placed in this encounter.     Procedures: No procedures performed   Clinical Data: No additional findings.  Objective: Vital Signs: There were no vitals taken for this visit.  Physical Exam:  Constitutional: Patient appears well-developed HEENT:  Head: Normocephalic Eyes:EOM are normal Neck: Normal range of motion Cardiovascular: Normal rate Pulmonary/chest: Effort normal Neurologic: Patient is alert Skin: Skin is warm Psychiatric: Patient has normal mood and affect  Ortho Exam: Ortho exam demonstrates left shoulder with 80 degrees external rotation, 90 degrees abduction, 100 degrees forward flexion.  Incision well-healed from prior surgery.  Axillary nerve is intact with deltoid firing.  Mildly tender over the site of the biceps tenodesis.  Moderately tender to palpation over  the acromion.  Increased pain in this location with lifting the arm.  Not really much pain with passively lifting the arm.  5/5 motor strength of EPL, FPL, finger abduction, grip strength, wrist extension, pronation/supination, bicep, tricep, deltoid.  Specialty Comments:  No specialty comments available.  Imaging: No results found.   PMFS History: Patient Active Problem List   Diagnosis Date Noted   Anemia 05/14/2018   Colon cancer (HCC) 05/14/2018    Class: History of   Rotator cuff arthropathy, left    Shoulder arthritis 12/18/2017   Pneumonia 01/14/2017   LLL pneumonia 01/14/2017    Acute respiratory distress 01/14/2017   Acute respiratory failure with hypoxia (HCC) 01/14/2017   Chronic back pain 01/14/2017   Opioid dependence (HCC) 01/14/2017   High cholesterol 01/14/2017   Current every day smoker 01/14/2017   Leukocytosis 01/14/2017   Constipation due to opioid therapy 01/14/2017   Hypertension 01/14/2017   Hyponatremia 01/14/2017   Dehydration 01/14/2017   Past Medical History:  Diagnosis Date   Arthritis    Cancer (HCC)    Prostate cancer   Chronic ankle pain    Chronic back pain    Chronic left shoulder pain    GERD (gastroesophageal reflux disease)    High cholesterol    History of alcohol abuse    Reportedly went through withdrawals after his 2017 back surgery.   Hypertension    Lumbar radiculopathy    Pain management    [facility]   Pneumonia     Family History  Problem Relation Age of Onset   Cancer Mother        breast   Cancer Sister        breast   Colon cancer Neg Hx     Past Surgical History:  Procedure Laterality Date   BACK SURGERY     COLONOSCOPY  2016   Dr. Patel: two 4-6 mm sigmoid colon polyps removed (adenomatous), mild left sided diverticulosis   COLONOSCOPY WITH PROPOFOL N/A 12/05/2018   Procedure: COLONOSCOPY WITH PROPOFOL;  Surgeon: Rourk, Robert M, MD;  Location: [facility];  Service: Endoscopy;  Laterality: N/A;  9:15am - pt knows to arrive at 8am for Covid test   EAR CYST EXCISION Left 12/18/2017   Procedure: EXCISION CYST LEFT SHOULDER;  Surgeon: Dean, Gregory Scott, MD;  Location: [facility] OR;  Service: Orthopedics;  Laterality: Left;   POLYPECTOMY  12/05/2018   Procedure: POLYPECTOMY;  Surgeon: Rourk, Robert M, MD;  Location: [facility];  Service: Endoscopy;;  colon   REVERSE SHOULDER ARTHROPLASTY Left 12/18/2017   Procedure: LEFT REVERSE SHOULDER ARTHROPLASTY;  Surgeon: Dean, Gregory Scott, MD;  Location: [facility] OR;  Service: Orthopedics;  Laterality: Left;   TONSILLECTOMY     Social History   Occupational History   Not on  file  Tobacco Use   Smoking status: Every Day    Packs/day: 0.15    Years: 40.00    Total pack years: 6.00    Types: Cigarettes   Smokeless tobacco: Never   Tobacco comments:    "I smoke 2 cigs a day"  Vaping Use   Vaping Use: Former  Substance and Sexual Activity   Alcohol use: Yes    Comment: *PT reports he quit drinking 08/19/19* weekly-  BEER (12 ounces every few days)   Drug use: No   Sexual activity: Not on file        

## 2021-10-13 ENCOUNTER — Institutional Professional Consult (permissible substitution): Admit: 2021-10-13 | Discharge: 2021-10-13 | Payer: MEDICARE

## 2021-10-13 ENCOUNTER — Ambulatory Visit: Admit: 2021-10-13 | Discharge: 2021-10-13 | Payer: MEDICARE | Attending: Medical Oncology | Primary: Medical Oncology

## 2021-10-13 ENCOUNTER — Other Ambulatory Visit: Admit: 2021-10-13 | Discharge: 2021-10-13 | Payer: MEDICARE

## 2021-10-13 DIAGNOSIS — C7951 Secondary malignant neoplasm of bone: Principal | ICD-10-CM

## 2021-10-13 DIAGNOSIS — C61 Malignant neoplasm of prostate: Principal | ICD-10-CM

## 2021-10-13 LAB — PSA: PROSTATE SPECIFIC ANTIGEN: <0.04 ng/mL (ref 0.00–4.00)

## 2021-10-13 MED ADMIN — leuprolide (LUPRON) injection 22.5 mg: 22.5 mg | INTRAMUSCULAR | @ 16:00:00 | Stop: 2021-10-13

## 2021-10-13 NOTE — Unmapped (Signed)
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  After adding enzalutamide, PSA dropped to non-detectable.    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  He is followed in a pain clinic and has seen Palliative Care.    He is feeling very well with good energy.    Based on PSA values and how he is feeling, we will continue current therapy with Lupron and enzalutamide.    Lab Results   Component Value Date    PSA <0.04 06/30/2021    PSA <0.04 03/17/2021    PSA <0.04 11/25/2020    PSA <0.04 08/26/2020    PSA <0.04 05/24/2020    PSA <0.04 12/25/2019       Plan:   - Continue 30-month Lupron 22.5mg  (06/30/21).  (I tried to change to 44-month injection but insurance refused this apparently.)  - Pain - has been managed by palliative care.  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  - He was previously referred to [facility] for ED assessment (and can also discuss urinary leakage).  - Follow up PSA.  - He will follow up with his pain clinic and with palliative Care here as warranted.  - He will meet about gas money today to help him out.  - Urinary leakage: He has seen Barkley Bruns, NP for evaluation. I recommended pads.  - BMD scan was done 11/25/20 with low bone density - started Fosamax.  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  - For ED, has seen Dr. Colon Branch in the past.  Viagra does not help.  He has spoken with Urology about options.    - Spine: He had prior decompression, now doing back exercises with substantial improvement of symptoms.  - Hot flashes - on Gabapentin 300 TID, Katina Degree PhD has counseled, pharmacy seeing today again. Stable  -   Code Status: Prior     I spent 41 minutes on records review, meeting with the patient, documentation and coordination on the day of service.    RTC in 3 months for next Lupron, to see if he received the 40mg  smaller enzalutamide tablets, and to check health status.      -------------------------------    Other Physicians:   Dr. Maryruth [facility])  Dr. Carnella [facility] PM&R)  Dr. Nolon Rod (referring; Radiation Oncology)   Dr. Baxter Flattery (Urology)   Dr. Nuala Alpha (Anesthesia pain)   Dr. Hoy Morn (Urology, RE: ED)   Dr. 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     Current therapy: Lupron    Oncology history:   - Diagnosed 2008 with prostate cancer, PSA 13.5.   - Prostate biopsy on 04/02/07 w Gleason 3+4=7   - 2009 Radiation therapy to w Dr. Nonie Hoyer 78 Gy.   - 2012-13: Rising PSA   - 11/13: Saw [facility], felt  not candidate for salvage RT   - 2/14: BS c/w 2009 with suspicious increased uptake at T5, L inferior iliac bone, subtle at L1.   - CT A/P shows sclerosis at L4-5 equivocal for DJD vs metastases, I discussed w radiology, felt most likely metastatic.   - 3/14: Lupron started   - 12/14: Lupron held for intermittent approach  - 11/13/13: Restarted Lupron due to PSA rise with worsening back pain  - 8/15: Palliative RT to spine in [place], [place].  - 05/25/14:  MRI spine: Multilevel degenerative changes, most prominent at L3-4 and L4-L5, where there is marked spinal canal stenosis and crowding with potential indentation of the nerve roots. Severe bilateral neural foraminal narrowing at L4-5 and moderate to marked neuroforaminal narrowing at L3-4 on the right. Additional multilevel degenerative changes as outlined above. - 08/15/15: Abnormal focal marrow signal and enhancement at the endplates surrounding the L4-5 disc space are favored to represent acutely inflamed Schmorl's nodes. Metastatic disease is felt less likely but is not entirely excluded, particularly given enlargement of the lesion at the L5 level. Continued follow-up recommended.  - 6/17: Bone mineral density: The femoral neck density is mildly low, but the spine and total femoral densities are normal.  - 10/17: L2-S1 decompression surgery w Dr. 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  - 12/17: L ankle pain, negative xray.  - 04/20/18: CT A/P: Stable heterogeneity of the left iliac wing representing known metastatic disease. No new evidence of metastatic disease within the abdomen/pelvis.  - 04/25/18: Bone scan: Increased radiotracer activity within the left iliac bone concerning for osseous metastases.  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  Ongoing back pain w L sciatica.  Hot flashes persist on Lupron, daily.  Nocturia/chronic dysuria: much better with Ditropan 0-1x/night.  No pain.  Persistent ED.  Remainder of 10 system ROS otherwise negative.    Physical exam:   BP 120/56  - Pulse 67  - Temp 36.3 ??C (97.3 ??F) (Temporal)  - Resp 16  - Ht 170.2 cm (5' 7)  - Wt 78.8 kg (173 lb 11.2 oz)  - SpO2 100%  - BMI 27.21 kg/m??   NAD  A+Ox3  No visible rash  No dyspnea or cough  No apparent neurological deficit    Current Outpatient Medications   Medication Sig Dispense Refill    alendronate (FOSAMAX) 70 MG tablet Take 1 tablet (70 mg total) by mouth every seven (7) days. 4 tablet 11    DULERA 200-5 mcg/actuation HFAA 20 mcg Two (2) times a day.      enzalutamide (XTANDI) 40 mg tablet Take 4 tablets (160 mg total) by mouth daily. Swallow tablets whole. Do not chew, crush or split tablets. 120 tablet 11 ezetimibe (ZETIA) 10 mg tablet Take 1 tablet (10 mg total) by mouth daily. Frequency:QD   Dosage:10   MG  Instructions:  Note:Dose: 10MG       gabapentin (NEURONTIN) 600 MG tablet Take 1 tablet (600 mg total) by mouth Two (2) times a day.      hydroCHLOROthiazide (HYDRODIURIL) 25 MG tablet Take 1 tablet (25 mg total) by mouth daily. 30 tablet 11    lisinopril (PRINIVIL,ZESTRIL) 40 MG tablet Take 1 tablet (40 mg total) by mouth daily.      MYRBETRIQ 25 mg Tb24 extended-release tablet TAKE 1 TABLET BY MOUTH  ONCE DAILY. 30 tablet 6    omeprazole (PRILOSEC) 20 MG capsule Take 1 capsule (20 mg total) by mouth daily.      oxybutynin (DITROPAN) 5 MG tablet Take by mouth. 5 mg qAM and 15 mg qPM  0    oxyCODONE (ROXICODONE) 5 MG immediate release tablet 1 tablet (5 mg total)  in the morning.      predniSONE (DELTASONE) 20 MG tablet 1 tablet (20 mg total)  in the morning.      pregabalin (LYRICA) 150 MG capsule Take 1 capsule (150 mg total) by mouth three (3) times a day (at 6am, noon and 6pm). 90 capsule 1    sildenafiL (VIAGRA) 100 MG tablet Take 1 tablet (100 mg total) by mouth nightly as needed for erectile dysfunction. 90 tablet 3    tadalafil 20 MG tablet Take 1 tablet (20 mg total) by mouth in the morning. 90 tablet 3    tamsulosin (FLOMAX) 0.4 mg capsule Take 1 capsule (0.4 mg total) by mouth daily. 30 capsule 11    traMADol (ULTRAM) 50 mg tablet Take 1 tablet (50 mg total) by mouth every six (6) hours as needed for pain.       No current facility-administered medications for this visit.       Allergies   Allergen Reactions    Poison Ivy Extract      Poison Ivy- Rash per patient       Laboratory:   06/27/12: PSA=3 (on Casodex), testosterone >800     Lab Results   Component Value Date    PSA Diagnostic 0.6 05/14/2014    PSA Diagnostic 0.5 02/05/2014    PSA Diagnostic 6.9 (H) 11/06/2013    PSA Diagnostic 2.4 07/24/2013    PSA Diagnostic 0.3 03/20/2013    PSA Diagnostic 0.2 12/26/2012       No visits with results within 2 Week(s) from this visit.   Latest known visit with results is:   Lab on 06/30/2021   Component Date Value Ref Range Status    PSA 06/30/2021 <0.04  0.00 - 4.00 ng/mL Final    Testosterone 06/30/2021 29 (L)  188 - 684 ng/dL Final    Sodium 16/01/9603 138  135 - 145 mmol/L Final    Potassium 06/30/2021    Final    Specimen Hemolyzed    Chloride 06/30/2021 106  98 - 107 mmol/L Final    CO2 06/30/2021 23.0  20.0 - 31.0 mmol/L Final    Anion Gap 06/30/2021 9  5 - 14 mmol/L Final    BUN 06/30/2021 18  9 - 23 mg/dL Final    Creatinine 54/12/8117 1.09  0.60 - 1.10 mg/dL Final    BUN/Creatinine Ratio 06/30/2021 17   Final    eGFR CKD-EPI (2021) Male 06/30/2021 72  >=60 mL/min/1.74m2 Final    eGFR calculated with CKD-EPI 2021 equation in accordance with [employer] and [employer] of Nephrology Task Force recommendations.    Glucose 06/30/2021 98  70 - 179 mg/dL Final    Calcium 14/78/2956 9.3  8.7 - 10.4 mg/dL Final    Albumin 21/30/8657 4.0  3.4 - 5.0 g/dL Final    Total Protein 06/30/2021 7.2  5.7 - 8.2 g/dL Final    Total Bilirubin 06/30/2021 0.7  0.3 - 1.2 mg/dL Final    AST 84/69/6295    Final    Specimen Hemolyzed    ALT 06/30/2021 9 (L)  10 - 49 U/L Final    Alkaline  Phosphatase 06/30/2021 77  46 - 116 U/L Final     Lab Results   Component Value Date    PSA <0.04 06/30/2021    PSA <0.04 03/17/2021    PSA <0.04 11/25/2020    PSA <0.04 08/26/2020    PSA <0.04 05/24/2020    PSA <0.04 12/25/2019     3/17: Thyroid function WNL.

## 2021-10-13 NOTE — Unmapped (Signed)
Hi,     Alvera Novel contacted the [employer] requesting to speak with the care team of Calvin Obrien to discuss:    -patient requested to speak with provider. He declined to give further details.    Please contact Geoffery  at [phone].      Thank you,   Durward Fortes  [facility]   [phone]

## 2021-10-13 NOTE — Unmapped (Signed)
Pt received lupron 22.5mg  injection in right dorsoGluteus. Tolerated it well. Applied guaze and band aid.

## 2021-10-13 NOTE — Unmapped (Signed)
Called patient with PSA results.  Patient appreciated call.  Informed patient to call for any needs.

## 2021-10-14 NOTE — Unmapped (Signed)
Called patient with PSA results.  Patient appreciative of call.

## 2021-10-14 NOTE — Unmapped (Signed)
Returned call to pt.States he has dry skin.Recommended he try a good moisturizing lotion ( Aveeno, Aquaphor). Also encouraged to drink plenty of fluids such as water.He verbalizes understanding and will call back with any further needs.

## 2021-10-17 ENCOUNTER — Other Ambulatory Visit: Payer: Medicare Other

## 2021-10-24 NOTE — Unmapped (Signed)
[facility] Refill Coordination Note    Specialty Medication(s) to be Shipped:   Hematology/Oncology: Calvin Obrien    Other medication(s) to be shipped: No additional medications requested for fill at this time     Calvin Obrien, DOB: Aug 12, 1950  Phone: There are no phone numbers on file.      All above HIPAA information was verified with patient.     Was a Nurse, learning disability used for this call? No    Completed refill call assessment today to schedule patient's medication shipment from the [facility] [phone]).  All relevant notes have been reviewed.     Specialty medication(s) and dose(s) confirmed: Regimen is correct and unchanged.   Changes to medications: Calvin Obrien reports no changes at this time.  Changes to insurance: No  New side effects reported not previously addressed with a pharmacist or physician: None reported  Questions for the pharmacist: No    Confirmed patient received a Conservation officer, historic buildings and a Surveyor, mining with first shipment. The patient will receive a drug information handout for each medication shipped and additional FDA Medication Guides as required.       DISEASE/MEDICATION-SPECIFIC INFORMATION        N/A    SPECIALTY MEDICATION ADHERENCE     Medication Adherence    Patient reported X missed doses in the last month: 0  Specialty Medication: Xtandi 40 mg  Patient is on additional specialty medications: No  Informant: patient              Were doses missed due to medication being on hold? No    Xtandi 40 mg: 1 days of medicine on hand       REFERRAL TO PHARMACIST     Referral to the pharmacist: Not needed      [facility]     Shipping address confirmed in Epic.     Delivery Scheduled: Yes, Expected medication delivery date: 10/26/21.     Medication will be delivered via UPS to the prescription address in Epic [place].    Calvin Obrien   [facility] Specialty Technician

## 2021-10-25 MED FILL — XTANDI 40 MG TABLET: ORAL | 30 days supply | Qty: 120 | Fill #3

## 2021-11-10 ENCOUNTER — Other Ambulatory Visit: Payer: Medicare Other

## 2021-11-23 NOTE — Unmapped (Signed)
[facility] Clinical Intervention    Type of intervention: Side effect management    Medication involved: Xtandi    Problem identified: dry flaky skin around the eyes    Intervention performed: Apply Cetaphil face cream/moisturizer around the eye area, be careful not to get it into the eyes.  Can apply 2-3 times daily as needed.  If doesn't improve or worsens then recommend seeing PCP or dermatologist.  Xeroderma is possible with xtandi. Per [employer]:  Dermatologic: Pruritus (4%), xeroderma (4%)     Follow-up needed: not at this time    Approximate time spent: 0-5 minutes    Clinical evidence used to support intervention: Professional judgement    Result of the intervention:  Side effect management    Allan Bacigalupi [facility] Specialty Pharmacist

## 2021-11-23 NOTE — Unmapped (Addendum)
[facility] Refill Coordination Note    Specialty Medication(s) to be Shipped:   Hematology/Oncology: [name]    Other medication(s) to be shipped: No additional medications requested for fill at this time     VOSS KESSELRING, DOB: 1950/12/24  Phone: There are no phone numbers on file.      All above HIPAA information was verified with patient.     Was a Nurse, learning disability used for this call? No    Completed refill call assessment today to schedule patient's medication shipment from the [facility] [phone]).  All relevant notes have been reviewed.     Specialty medication(s) and dose(s) confirmed: Regimen is correct and unchanged.   Changes to medications: Presley reports no changes at this time.  Changes to insurance: No  New side effects reported not previously addressed with a pharmacist or physician: Yes - Patient reports dry skin. Patient would like to speak to the pharmacist today. Their provider (unknown if) aware.  Questions for the pharmacist: No    Confirmed patient received a Conservation officer, historic buildings and a Surveyor, mining with first shipment. The patient will receive a drug information handout for each medication shipped and additional FDA Medication Guides as required.       DISEASE/MEDICATION-SPECIFIC INFORMATION        N/A    SPECIALTY MEDICATION ADHERENCE     Medication Adherence    Patient reported X missed doses in the last month: 0  Specialty Medication: Xtandi 40 mg  Patient is on additional specialty medications: No  Informant: patient                          Were doses missed due to medication being on hold? No    Xtandi 40mg   : 2 days of medicine on hand       REFERRAL TO PHARMACIST     Referral to the pharmacist: Yes - new side effects are reported: dry skin. Refills were scheduled and concern routed (high priority) to pharmacist for evaluation.      SHIPPING     Shipping address confirmed in Epic.     Delivery Scheduled: Yes, Expected medication delivery date: 8/25.     Medication will be delivered via UPS to the prescription address in Epic WAM.    Westley Gambles   [facility] Specialty Technician

## 2021-11-23 NOTE — Unmapped (Signed)
The [facility] has made a third and final attempt to reach this patient to refill the following medication:Xtandi.      We have been unable to leave messages on the following phone numbers: [phone] and have sent a text message to the following phone numbers: [phone] .    Dates contacted: 8/10,16,23  Last scheduled delivery: 10/25/21    The patient may be at risk of non-compliance with this medication. The patient should call the [facility] at [phone]  Option 4, then Option 1 (oncology) to refill medication.    [name] Hulda Humphrey   [facility] Specialty Technician

## 2021-11-24 MED FILL — XTANDI 40 MG TABLET: ORAL | 30 days supply | Qty: 120 | Fill #4

## 2021-12-13 ENCOUNTER — Other Ambulatory Visit: Payer: Medicare Other

## 2021-12-20 NOTE — Unmapped (Signed)
[facility] Refill Coordination Note    Specialty Medication(s) to be Shipped:   Hematology/Oncology: Calvin Obrien    Other medication(s) to be shipped: No additional medications requested for fill at this time     Calvin Obrien, DOB: 10-13-1950  Phone: There are no phone numbers on file.      All above HIPAA information was verified with patient.     Was a Nurse, learning disability used for this call? No    Completed refill call assessment today to schedule patient's medication shipment from the [facility] [phone]).  All relevant notes have been reviewed.     Specialty medication(s) and dose(s) confirmed: Regimen is correct and unchanged.   Changes to medications: Calvin Obrien reports no changes at this time.  Changes to insurance: No  New side effects reported not previously addressed with a pharmacist or physician: None reported  Questions for the pharmacist: No    Confirmed patient received a Conservation officer, historic buildings and a Surveyor, mining with first shipment. The patient will receive a drug information handout for each medication shipped and additional FDA Medication Guides as required.       DISEASE/MEDICATION-SPECIFIC INFORMATION        N/A    SPECIALTY MEDICATION ADHERENCE     Medication Adherence    Patient reported X missed doses in the last month: 0  Specialty Medication: Xtandi 40 mg  Patient is on additional specialty medications: No  Any gaps in refill history greater than 2 weeks in the last 3 months: no  Demonstrates understanding of importance of adherence: yes  Informant: patient  Reliability of informant: reliable              Confirmed plan for next specialty medication refill: delivery by pharmacy  Refills needed for supportive medications: not needed              Were doses missed due to medication being on hold? No    Xtandi 40mg   : 5 days of medicine on hand       REFERRAL TO PHARMACIST     Referral to the pharmacist: Not needed      [facility]     Shipping address confirmed in Epic.     Delivery Scheduled: Yes, Expected medication delivery date: 12/22/2021.     Medication will be delivered via UPS to the prescription address in Epic WAM.    Valere Dross   [facility] Specialty Technician

## 2021-12-21 MED FILL — XTANDI 40 MG TABLET: ORAL | 30 days supply | Qty: 120 | Fill #5

## 2022-01-19 NOTE — Unmapped (Signed)
[facility] Refill Coordination Note    Specialty Medication(s) to be Shipped:   Hematology/Oncology: Calvin Obrien    Other medication(s) to be shipped: No additional medications requested for fill at this time     Calvin Obrien, DOB: [birth year]  Phone: There are no phone numbers on file.      All above HIPAA information was verified with patient.     Was a Nurse, learning disability used for this call? No    Completed refill call assessment today to schedule patient's medication shipment from the [facility] [phone]).  All relevant notes have been reviewed.     Specialty medication(s) and dose(s) confirmed: Regimen is correct and unchanged.   Changes to medications: [name] reports no changes at this time.  Changes to insurance: No  New side effects reported not previously addressed with a pharmacist or physician: None reported  Questions for the pharmacist: No    Confirmed patient received a Conservation officer, historic buildings and a Surveyor, mining with first shipment. The patient will receive a drug information handout for each medication shipped and additional FDA Medication Guides as required.       DISEASE/MEDICATION-SPECIFIC INFORMATION        N/A    SPECIALTY MEDICATION ADHERENCE     Medication Adherence    Patient reported X missed doses in the last month: 0  Specialty Medication: XTANDI 40 mg tablet (enzalutamide)  Patient is on additional specialty medications: No                                Were doses missed due to medication being on hold? No    Xtandi 40mg   : 4 days of medicine on hand       REFERRAL TO PHARMACIST     Referral to the pharmacist: Not needed      [facility]     Shipping address confirmed in Epic.     Delivery Scheduled: Yes, Expected medication delivery date: 01/23/22.     Medication will be delivered via UPS to the prescription address in Epic WAM.    Calvin Obrien   [facility] Specialty Technician

## 2022-01-19 NOTE — Unmapped (Signed)
The [facility] has made a second and final attempt to reach this patient to refill the following medication:Xtandi.      We have been unable to leave messages on the following phone numbers: [phone] and have sent a text message to the following phone numbers: [phone] .    Dates contacted: 10/5,19  Last scheduled delivery: 12/21/21    The patient may be at risk of non-compliance with this medication. The patient should call the [facility] at [phone]  Option 4, then Option 1 (oncology) to refill medication.    Calvin Obrien Calvin Obrien   [facility] Specialty Technician

## 2022-01-20 MED FILL — XTANDI 40 MG TABLET: ORAL | 30 days supply | Qty: 120 | Fill #6

## 2022-02-02 ENCOUNTER — Institutional Professional Consult (permissible substitution): Admit: 2022-02-02 | Discharge: 2022-02-02 | Payer: MEDICARE

## 2022-02-02 ENCOUNTER — Other Ambulatory Visit: Admit: 2022-02-02 | Discharge: 2022-02-02 | Payer: MEDICARE

## 2022-02-02 ENCOUNTER — Ambulatory Visit: Admit: 2022-02-02 | Discharge: 2022-02-02 | Payer: MEDICARE | Attending: Medical Oncology | Primary: Medical Oncology

## 2022-02-02 DIAGNOSIS — C61 Malignant neoplasm of prostate: Principal | ICD-10-CM

## 2022-02-02 DIAGNOSIS — C7951 Secondary malignant neoplasm of bone: Principal | ICD-10-CM

## 2022-02-02 LAB — PSA: PROSTATE SPECIFIC ANTIGEN: <0.04 ng/mL (ref 0.00–4.00)

## 2022-02-02 MED ORDER — ALENDRONATE 70 MG TABLET
ORAL_TABLET | ORAL | 11 refills | 28 days | Status: CP
Start: 2022-02-02 — End: 2023-02-02

## 2022-02-02 MED ADMIN — leuprolide (LUPRON) injection 22.5 mg: 22.5 mg | INTRAMUSCULAR | @ 15:00:00 | Stop: 2022-02-02

## 2022-02-02 NOTE — Unmapped (Signed)
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  After adding enzalutamide, PSA dropped to non-detectable.    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  He is followed in a pain clinic and has seen Palliative Care.    He is feeling very well with good energy.    Based on PSA values and how he is feeling, we will continue current therapy with Lupron and enzalutamide.    Lab Results   Component Value Date    PSA <0.04 02/02/2022    PSA <0.04 10/13/2021    PSA <0.04 06/30/2021    PSA <0.04 03/17/2021    PSA <0.04 11/25/2020    PSA <0.04 08/26/2020       Plan:   - Continue 88-month Lupron 22.5mg  (02/02/22).  (I tried to change to 110-month injection but insurance refused this apparently.)  - Pain - has been managed by palliative care.  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  - Face rash, possibly related to enzalutamide?  Will refer to Dermatology.  - He was previously referred to [facility] for ED assessment (and can also discuss urinary leakage).  - Follow up PSA.  - He will follow up with his pain clinic and with palliative Care here as warranted.  - He will meet about gas money today to help him out.  - Urinary leakage: He has seen Barkley Bruns, NP for evaluation. I recommended pads.  - BMD scan was done 11/25/20 with low bone density - started Fosamax.  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  - For ED, has seen Dr. Colon Branch in the past.  Viagra does not help.  He has spoken with Urology about options.    - Spine: He had prior decompression, now doing back exercises with substantial improvement of symptoms.  - Hot flashes - on Gabapentin 300 TID, Katina Degree PhD has counseled, pharmacy seeing today again. Stable  -   Code Status: Prior     I spent 41 minutes on records review, meeting with the patient, documentation and coordination on the day of service.    RTC in 3 months for next Lupron, to see if he received the 40mg  smaller enzalutamide tablets, and to check health status.      -------------------------------    Other Physicians:   Dr. Maryruth Eve [facility])  Dr. Carnella Guadalajara [facility])  Dr. Nolon Rod (referring; Radiation Oncology)   Dr. Baxter Flattery (Urology)   Dr. Nuala Alpha (Anesthesia pain)   Dr. Hoy Morn (Urology, RE: ED)   Dr. 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     Current therapy: Lupron    Oncology history:   - Diagnosed 2008 with prostate cancer, PSA 13.5.   - Prostate biopsy on 04/02/07 w Gleason 3+4=7   - 2009 Radiation therapy to w Dr. Nonie Hoyer 78 Gy.   -  2012-13: Rising PSA   - 11/13: Saw [facility], felt not candidate for salvage RT   - 2/14: BS c/w 2009 with suspicious increased uptake at T5, L inferior iliac bone, subtle at L1.   - CT A/P shows sclerosis at L4-5 equivocal for DJD vs metastases, I discussed w radiology, felt most likely metastatic.   - 3/14: Lupron started   - 12/14: Lupron held for intermittent approach  - 11/13/13: Restarted Lupron due to PSA rise with worsening back pain  - 8/15: Palliative RT to spine in [place], [place].  - 05/25/14:  MRI spine: Multilevel degenerative changes, most prominent at L3-4 and L4-L5, where there is marked spinal canal stenosis and crowding with potential indentation of the nerve roots. Severe bilateral neural foraminal narrowing at L4-5 and moderate to marked neuroforaminal narrowing at L3-4 on the right. Additional multilevel degenerative changes as outlined above.    - 08/15/15: Abnormal focal marrow signal and enhancement at the endplates surrounding the L4-5 disc space are favored to represent acutely inflamed Schmorl's nodes. Metastatic disease is felt less likely but is not entirely excluded, particularly given enlargement of the lesion at the L5 level. Continued follow-up recommended.  - 6/17: Bone mineral density: The femoral neck density is mildly low, but the spine and total femoral densities are normal.  - 10/17: L2-S1 decompression surgery w Dr. 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  - 12/17: L ankle pain, negative xray.  - 04/20/18: CT A/P: Stable heterogeneity of the left iliac wing representing known metastatic disease. No new evidence of metastatic disease within the abdomen/pelvis.  - 04/25/18: Bone scan: Increased radiotracer activity within the left iliac bone concerning for osseous metastases.  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  Ongoing back pain w L sciatica.  Hot flashes persist on Lupron, daily.  Nocturia/chronic dysuria: much better with Ditropan 0-1x/night.  No pain.  Persistent ED.  Remainder of 10 system ROS otherwise negative.    Physical exam:   BP 143/67  - Pulse 60  - Temp 36.4 ??C (97.5 ??F) (Oral)  - Ht 170.2 cm (5' 7)  - Wt 81.1 kg (178 lb 11.2 oz)  - SpO2 100%  - BMI 27.99 kg/m??   NAD  A+Ox3  No visible rash  No dyspnea or cough  No apparent neurological deficit    Current Outpatient Medications   Medication Sig Dispense Refill    alendronate (FOSAMAX) 70 MG tablet Take 1 tablet (70 mg total) by mouth every seven (7) days. 4 tablet 11    DULERA 200-5 mcg/actuation HFAA 20 mcg Two (2) times a day.      enzalutamide (XTANDI) 40 mg tablet Take 4 tablets (160 mg total) by mouth daily. Swallow tablets whole. Do not chew, crush or split tablets. 120 tablet 11    ezetimibe (ZETIA) 10 mg tablet Take 1 tablet (10 mg total) by mouth daily. Frequency:QD   Dosage:10   MG  Instructions:  Note:Dose: 10MG       gabapentin (NEURONTIN) 600 MG tablet Take 1 tablet (600 mg total) by mouth two (2) times a day.      hydroCHLOROthiazide (HYDRODIURIL) 25 MG tablet Take 1 tablet (25 mg total) by mouth daily. 30 tablet 11    lisinopril (PRINIVIL,ZESTRIL) 40 MG tablet Take 1 tablet (40 mg total) by mouth daily.  MYRBETRIQ 25 mg Tb24 extended-release tablet TAKE 1 TABLET BY MOUTH ONCE DAILY. 30 tablet 6    omeprazole (PRILOSEC) 20 MG capsule Take 1 capsule (20 mg total) by mouth daily.      oxybutynin (DITROPAN) 5 MG tablet Take by mouth. 5 mg qAM and 15 mg qPM  0    oxyCODONE (ROXICODONE) 5 MG immediate release tablet 1 tablet (5 mg total)  in the morning.      predniSONE (DELTASONE) 20 MG tablet 1 tablet (20 mg total)  in the morning.      pregabalin (LYRICA) 150 MG capsule Take 1 capsule (150 mg total) by mouth three (3) times a day (at 6am, noon and 6pm). 90 capsule 1    sildenafiL (VIAGRA) 100 MG tablet Take 1 tablet (100 mg total) by mouth nightly as needed for erectile dysfunction. 90 tablet 3    tadalafil 20 MG tablet Take 1 tablet (20 mg total) by mouth in the morning. 90 tablet 3    tamsulosin (FLOMAX) 0.4 mg capsule Take 1 capsule (0.4 mg total) by mouth daily. 30 capsule 11    traMADol (ULTRAM) 50 mg tablet Take 1 tablet (50 mg total) by mouth every six (6) hours as needed for pain.       No current facility-administered medications for this visit.       Allergies   Allergen Reactions    Poison Ivy Extract      Poison Ivy- Rash per patient       Laboratory:   06/27/12: PSA=3 (on Casodex), testosterone >800     Lab Results   Component Value Date    PSA Diagnostic 0.6 05/14/2014    PSA Diagnostic 0.5 02/05/2014    PSA Diagnostic 6.9 (H) 11/06/2013    PSA Diagnostic 2.4 07/24/2013    PSA Diagnostic 0.3 03/20/2013    PSA Diagnostic 0.2 12/26/2012       Lab on 02/02/2022   Component Date Value Ref Range Status    PSA 02/02/2022 <0.04  0.00 - 4.00 ng/mL Final     Lab Results   Component Value Date    PSA <0.04 02/02/2022    PSA <0.04 10/13/2021    PSA <0.04 06/30/2021    PSA <0.04 03/17/2021    PSA <0.04 11/25/2020    PSA <0.04 08/26/2020     3/17: Thyroid function WNL.

## 2022-02-02 NOTE — Unmapped (Signed)
Pt tolerated Lupron injection to left dorsogluteal upper outer quadrant without difficulty. Band aid and gauze placed over injection sites. [facility] left [facility] ambulatory, steady gait, NAD, no questions, complaints, nor concerns voiced at d/c.

## 2022-02-02 NOTE — Unmapped (Signed)
Labs drawn via butterfly & sent for analysis.  To next appt.  Care provided by DM RN.

## 2022-02-08 DIAGNOSIS — C61 Malignant neoplasm of prostate: Principal | ICD-10-CM

## 2022-02-08 DIAGNOSIS — C7951 Secondary malignant neoplasm of bone: Principal | ICD-10-CM

## 2022-02-09 NOTE — Unmapped (Unsigned)
[facility] Clinical Assessment & Refill Coordination Note    Calvin Obrien, DOB: November 11, 1950  Phone: There are no phone numbers on file.    All above HIPAA information was verified with {Blank:19197::patient.,patient's caregiver, ***.,patient's family member, ***.}     Was a Nurse, learning disability used for this call? No    Specialty Medication(s):   Hematology/Oncology: [name]     Current Outpatient Medications   Medication Sig Dispense Refill    alendronate (FOSAMAX) 70 MG tablet Take 1 tablet (70 mg total) by mouth every seven (7) days. 4 tablet 11    DULERA 200-5 mcg/actuation HFAA 20 mcg Two (2) times a day.      enzalutamide (XTANDI) 40 mg tablet Take 4 tablets (160 mg total) by mouth daily. Swallow tablets whole. Do not chew, crush or split tablets. 120 tablet 11    ezetimibe (ZETIA) 10 mg tablet Take 1 tablet (10 mg total) by mouth daily. Frequency:QD   Dosage:10   MG  Instructions:  Note:Dose: 10MG       gabapentin (NEURONTIN) 600 MG tablet Take 1 tablet (600 mg total) by mouth two (2) times a day.      hydroCHLOROthiazide (HYDRODIURIL) 25 MG tablet Take 1 tablet (25 mg total) by mouth daily. 30 tablet 11    lisinopril (PRINIVIL,ZESTRIL) 40 MG tablet Take 1 tablet (40 mg total) by mouth daily.      MYRBETRIQ 25 mg Tb24 extended-release tablet TAKE 1 TABLET BY MOUTH ONCE DAILY. 30 tablet 6    omeprazole (PRILOSEC) 20 MG capsule Take 1 capsule (20 mg total) by mouth daily.      oxybutynin (DITROPAN) 5 MG tablet Take by mouth. 5 mg qAM and 15 mg qPM  0    oxyCODONE (ROXICODONE) 5 MG immediate release tablet 1 tablet (5 mg total)  in the morning.      predniSONE (DELTASONE) 20 MG tablet 1 tablet (20 mg total)  in the morning.      pregabalin (LYRICA) 150 MG capsule Take 1 capsule (150 mg total) by mouth three (3) times a day (at 6am, noon and 6pm). 90 capsule 1    sildenafiL (VIAGRA) 100 MG tablet Take 1 tablet (100 mg total) by mouth nightly as needed for erectile dysfunction. 90 tablet 3    tadalafil 20 MG tablet Take 1 tablet (20 mg total) by mouth in the morning. 90 tablet 3    tamsulosin (FLOMAX) 0.4 mg capsule Take 1 capsule (0.4 mg total) by mouth daily. 30 capsule 11    traMADol (ULTRAM) 50 mg tablet Take 1 tablet (50 mg total) by mouth every six (6) hours as needed for pain.       No current facility-administered medications for this visit.        Changes to medications: {Blank:19197::Myers reports starting the following medications: ***,Logun Reports stopping the following medications: ***,Lorrie reports no changes at this time.}    Allergies   Allergen Reactions    Poison Ivy Extract      Poison Ivy- Rash per patient       Changes to allergies: {Blank:19197::Yes: ***,No}    SPECIALTY MEDICATION ADHERENCE     Xtandi 40 mg: *** days of medicine on hand     Medication Adherence    Specialty Medication: Enzalutamide                            Specialty medication(s) dose(s) confirmed: {Blank:19197::Regimen is correct  and unchanged.,Patient reports changes to the regimen as follows: ***}     Are there any concerns with adherence? {Blank:19197::Yes: ***,No}    Adherence counseling provided? {Blank:19197::Yes: ***,Not needed}    CLINICAL MANAGEMENT AND INTERVENTION      Clinical Benefit Assessment:    Do you feel the medicine is effective or helping your condition? {Blank:19197::Yes,No,Patient declined to answer}    Clinical Benefit counseling provided? {Blank:19197::Not needed,Reasonable expectations discussed: ***,Labs from *** show evidence of clinical benefit,Progress note from *** shows evidence of clinical benefit,consulted provider regarding clinical benefit concerns,***}    Adverse Effects Assessment:    Are you experiencing any side effects? {Blank:19197::Yes, patient reports experiencing ***. Side effect counseling provided: ***,No}    Are you experiencing difficulty administering your medicine? {Blank:19197::Yes, patient reports ***. Medication administration counseling provided: ***,No}    Quality of Life Assessment:    Quality of Life    Rheumatology  Oncology  Dermatology  Cystic Fibrosis          {DiseaseSpecificQOL:73897}    Have you discussed this with your provider? {Blank:19197::Not needed,Yes,No - pharmacist will consult provider}    Acute Infection Status:    Acute infections noted within Epic:  No active infections  Patient reported infection: {Blank single:19197::None,***- patient reported to provider,***- pharmacy reported to provider}    Therapy Appropriateness:    Is therapy appropriate and patient progressing towards therapeutic goals? {Blank:19197::Yes, therapy is appropriate and should be continued,Pharmacist will consult provider}    DISEASE/MEDICATION-SPECIFIC INFORMATION      {clinicspecificinstructions:59274}    {DISEASESTATESPECIFICASSESSMENT:98894}    PATIENT SPECIFIC NEEDS     Does the patient have any physical, cognitive, or cultural barriers? {Blank single:19197::No,Yes - ***}    Is the patient high risk? {sschighriskpts:78327}    Did the patient require a clinical intervention? {Blank single:19197::No,Yes (If yes, document using the sscrphintervention smartphrase)}    Does the patient require physician intervention or other additional services (i.e., nutrition, smoking cessation, social work)? {Blank single:19197::No,Yes, ***}    SOCIAL DETERMINANTS OF HEALTH     At the [facility], we have learned that life circumstances - like trouble affording food, housing, utilities, or transportation can affect the health of many of our patients.   That is why we wanted to ask: are you currently experiencing any life circumstances that are negatively impacting your health and/or quality of life? {YES/NO/PATIENTDECLINED:93004}    Social Determinants of Health     Financial Resource Strain: Not on file   Internet Connectivity: Not on file   Food Insecurity: Not on file   Tobacco Use: High Risk (12/02/2020)    Patient History     Smoking Tobacco Use: Some Days     Smokeless Tobacco Use: Never     Passive Exposure: Not on file   Housing/Utilities: Not on file   Alcohol Use: Not on file   Transportation Needs: Not on file   Substance Use: Not on file   Health Literacy: Not on file   Physical Activity: Not on file   Interpersonal Safety: Not on file   Stress: Not on file   Intimate Partner Violence: Not on file   Depression: Not on file   Social Connections: Not on file       Would you be willing to receive help with any of the needs that you have identified today? {Yes/No/Not applicable:93005}       SHIPPING     Specialty Medication(s) to be Shipped:   {specpharm:59087}    Other medication(s)  to be shipped: {Blank:19197::***,No additional medications requested for fill at this time}     Changes to insurance: {Blank:19197::Yes: ***,No}    Delivery Scheduled: {Blank:19197::Yes, Expected medication delivery date: ***.,Yes, Expected medication delivery date: ***.  However, Rx request for refills was sent to the provider as there are none remaining.,Patient declined refill at this time due to ***.,No, cannot schedule delivery at this time as there are outstanding items that need addressed.  This note has been handed off to the provider for follow up.,Due to patient insurance changes, unable to fill at [facility], please route Rx to *** specialty pharmacy}     Medication will be delivered via {Blank:19197::UPS,Next Day Courier,Same Day Courier,Clinic Courier - *** clinic,***} to the confirmed {Blank:19197::prescription,temporary} address in [facility].    The patient will receive a drug information handout for each medication shipped and additional FDA Medication Guides as required.  Verified that patient has previously received a Conservation officer, historic buildings and a Surveyor, mining.    The patient or caregiver noted above participated in the development of this care plan and knows that they can request review of or adjustments to the care plan at any time.      All of the patient's questions and concerns have been addressed.    Calvin Obrien, PharmD   [facility] Specialty Pharmacist

## 2022-02-28 MED FILL — XTANDI 40 MG TABLET: ORAL | 30 days supply | Qty: 120 | Fill #7

## 2022-02-28 NOTE — Unmapped (Signed)
Calvin Obrien 's Xtandi shipment will be sent out  as a result of sufficient inventory of the drug.      I have spoke to the patient   and communicated the delivery change. We will reschedule the medication for the delivery date that the patient agreed upon.  We have confirmed the delivery date as 03/01/22

## 2022-03-07 ENCOUNTER — Ambulatory Visit: Admit: 2022-03-07 | Payer: MEDICARE

## 2022-03-07 NOTE — Unmapped (Deleted)
Dermatology Note     Assessment and Plan:      Benign Lesions/ Findings:   {derm skin check benign A&P:75424}  - Reassurance provided regarding the benign appearance of lesions noted on exam today; no treatment is indicated in the absence of symptoms/changes.  - Reinforced importance of photoprotective strategies including liberal and frequent sunscreen use of a broad-spectrum SPF 30 or greater, use of protective clothing, and sun avoidance for prevention of cutaneous malignancy and photoaging.  Counseled patient on the importance of regular self-skin monitoring as well as routine clinical skin examinations as scheduled.     Personal history of {derm skin check cancer list:75440} ***  - No evidence of recurrence at this time.  - Discussed maintaining vigilance and counseled on sun protection as above.    There are no diagnoses linked to this encounter.    The patient was advised to call for an appointment should any new, changing, or symptomatic lesions develop.     RTC: No follow-ups on file. or sooner as needed   _________________________________________________________________      Chief Complaint     Chief Complaint   Patient presents with    Skin Check     FBSE, spots on the face       HPI     Calvin Obrien is a 71 y.o. male who presents as a new patient to Dermatology for a full body skin exam. Patient reports a lesion of concern.     LOC 1:  - Location  - Duration:  - Bleeding:   - Scaling:   - Painful  - Pruritic:  - Evolution:     The patient denies any other new or changing lesions or areas of concern.     Pertinent Past Medical History     {derm PMH :75456}    Problem List    None    ***    Family History:   {derm skin check melanoma history:75452}    Past Medical History, Family History, Social History, Medication List, Allergies, and Problem List were reviewed in the rooming section of Epic.     ROS: Other than symptoms mentioned in the HPI, {derm skin check ros:75408::no fevers, chills, or other skin complaints}    Physical Examination     GENERAL: Well-appearing male in no acute distress, resting comfortably.  {PE systems:75418::NEURO: Alert and oriented, answers questions appropriately}  {PE extent:75514}  {PE list:75421}  ***    {PE limitations:75419::All areas not commented on are within normal limits or unremarkable}      (Approved Template 12/15/2019)

## 2022-03-16 NOTE — Unmapped (Signed)
This encounter was created in error - please disregard.

## 2022-03-30 NOTE — Unmapped (Signed)
[facility] Refill Coordination Note    Specialty Medication(s) to be Shipped:   Hematology/Oncology: [name]    Other medication(s) to be shipped: No additional medications requested for fill at this time     Calvin Obrien, DOB: 1950-04-12  Phone: There are no phone numbers on file.      All above HIPAA information was verified with patient.     Was a Nurse, learning disability used for this call? No    Completed refill call assessment today to schedule patient's medication shipment from the [facility] [phone]).  All relevant notes have been reviewed.     Specialty medication(s) and dose(s) confirmed: Regimen is correct and unchanged.   Changes to medications: Sami reports no changes at this time.  Changes to insurance: No  New side effects reported not previously addressed with a pharmacist or physician: Yes - Patient reports the skin around his eyes dry out constantly. Patient would like to speak to the pharmacist today. Their provider is not aware.  Questions for the pharmacist: No    Confirmed patient received a Conservation officer, historic buildings and a Surveyor, mining with first shipment. The patient will receive a drug information handout for each medication shipped and additional FDA Medication Guides as required.       DISEASE/MEDICATION-SPECIFIC INFORMATION        N/A    SPECIALTY MEDICATION ADHERENCE     Medication Adherence    Patient reported X missed doses in the last month: 0  Specialty Medication: XTANDI 40 mg tablet (enzalutamide)  Patient is on additional specialty medications: No  Patient is on more than two specialty medications: No  Any gaps in refill history greater than 2 weeks in the last 3 months: no  Demonstrates understanding of importance of adherence: yes  Informant: patient  Reliability of informant: reliable  Provider-estimated medication adherence level: good  Patient is at risk for Non-Adherence: No  Reasons for non-adherence: no problems identified Were doses missed due to medication being on hold? No    XTANDI 40 mg tablet (enzalutamide)  : 2 days of medicine on hand        REFERRAL TO PHARMACIST     Referral to the pharmacist: Not needed      [facility]     Shipping address confirmed in Epic.     Delivery Scheduled: Yes, Expected medication delivery date: 04/05/22.     Medication will be delivered via UPS to the prescription address in Epic WAM.    Cederick Broadnax' [facility] Specialty Technician

## 2022-03-30 NOTE — Unmapped (Signed)
[facility] has made a third and final attempt to reach this patient to refill the following medication:Xtandi.      We have been unable to leave messages on the following phone numbers: [phone] and have sent a text message to the following phone numbers: [phone] .    Dates contacted: 12/13,20,27  Last scheduled delivery: 02/28/22    The patient may be at risk of non-compliance with this medication. The patient should call the [facility] at [phone]  Option 4, then Option 1 (oncology) to refill medication.    [name] Hulda Humphrey   [facility] Specialty Technician

## 2022-04-04 MED FILL — XTANDI 40 MG TABLET: ORAL | 30 days supply | Qty: 120 | Fill #8

## 2022-04-18 ENCOUNTER — Ambulatory Visit
Admit: 2022-04-18 | Discharge: 2022-04-19 | Payer: MEDICARE | Attending: Nurse Practitioner | Primary: Nurse Practitioner

## 2022-04-18 DIAGNOSIS — M79675 Pain in left toe(s): Principal | ICD-10-CM

## 2022-04-18 DIAGNOSIS — C7951 Secondary malignant neoplasm of bone: Principal | ICD-10-CM

## 2022-04-18 DIAGNOSIS — G8929 Other chronic pain: Principal | ICD-10-CM

## 2022-04-18 DIAGNOSIS — C61 Malignant neoplasm of prostate: Principal | ICD-10-CM

## 2022-04-18 NOTE — Unmapped (Signed)
Rfv:  mCRPC and lUTS      A&P:    1. Lower urinary tract symptoms:      Bladder retraining: Timed voiding every 2 hours while awake. Hold the urge as long as he can  Decrease nighttime fluid intake and stop all fluids at least one hour before bed  Void right before lying down  Improve sleep hygiene for more restorative sleep. Tips given  He is not interested in PFPT at this time  Continue tamsulosin, oxybutynin, and add mirabegron     2. mCRPC          Follow up:            Calvin Parr, NP-C  Adult Nurse Practitioner  Urology & Medical Oncology      Oncology history:   - Diagnosed 2008 with prostate cancer, PSA 13.5.   - Prostate biopsy on 04/02/07 w Gleason 3+4=7   - 2009 Radiation therapy to w Dr. Nonie Obrien 78 Gy.   - 2012-13: Rising PSA   - 11/13: Saw [place] [facility], felt not candidate for salvage RT   - 2/14: BS c/w 2009 with suspicious increased uptake at T5, L inferior iliac bone, subtle at L1.   - CT A/P shows sclerosis at L4-5 equivocal for DJD vs metastases, I discussed w radiology, felt most likely metastatic.   - 3/14: Lupron started   - 12/14: Lupron held for intermittent approach  - 11/13/13: Restarted Lupron due to PSA rise with worsening back pain  - 8/15: Palliative RT to spine in [place], [place].  - 05/25/14:  MRI spine: Multilevel degenerative changes, most prominent at L3-4 and L4-L5, where there is marked spinal canal stenosis and crowding with potential indentation of the nerve roots. Severe bilateral neural foraminal narrowing at L4-5 and moderate to marked neuroforaminal narrowing at L3-4 on the right. Additional multilevel degenerative changes as outlined above.    - 08/15/15: Abnormal focal marrow signal and enhancement at the endplates surrounding the L4-5 disc space are favored to represent acutely inflamed Schmorl's nodes. Metastatic disease is felt less likely but is not entirely excluded, particularly given enlargement of the lesion at the L5 level. Continued follow-up recommended.  - 6/17: Bone mineral density: The femoral neck density is mildly low, but the spine and total femoral densities are normal.  - 10/17: L2-S1 decompression surgery w Dr. Katheran Obrien, postoperative alcohol withdrawal and patient subsequently states he quit drinking.  - 12/17: L ankle pain, negative xray.  - 04/20/18: CT A/P: Stable heterogeneity of the left iliac wing representing known metastatic disease. No new evidence of metastatic disease within the abdomen/pelvis.  - 04/25/18: Bone scan: Increased radiotracer activity within the left iliac bone concerning for osseous metastases.  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 man who is being treated for advanced prostate cancer by Dr Calvin Obrien. Current treatment is ADT+ enzalutamide    He notes some bothersome urinary symptoms.  For about 6 months, he says he has had increased urgency with UUI. Says he just cant hold it when he gets the urge  Wears 2 pads/day; not soaked  He usually sleeps until about 0300, then gets up 3 times to void before he's up for the day  He is taking 3 pills before bed to help with the nocturia, not sure the name of it. He is not sure if  he is taking tamsulosin  His med list includes tamsulosin and oxybutynin, both of which were written here in 2018.   He drinks 5-6 glasses of water every day, an occasional Sprite, and a can of beer a few days per week  He denies hematuria, dysuria, retention, or straining  Has a daily soft bowel movement    Interval 12/25/2019    Brought his pill bottles: Taking mirabegron, oxybutynin, flomax  nocturia x 3, falls back to sleep. This is the most bothersome symptoms for him. He doesn't have great sleep hygiene. Sometimes drinks beer. Watches TV for 2 hours in the bed.  No new symptoms  Bowel habits normal    Interval 05/24/2020    He has been doing well  Urinary symptoms are improved  Taking enza as prescribed  No bone pain, Sometimes drinks beer. Watches TV for 2 hours in the bed.  No new symptoms  Bowel habits normal    Interval 05/24/2020    He has been doing well  Urinary symptoms are improved  Taking enza as prescribed  No bone pain, hematuria, weight loss    Interval hx 04/18/2022    He is here today as a referral was put in for UI  Nocturia x sometimes 6 times; chronic. Stops fluids at 6 pm, goes to bed at 9 or 10. He does snore. Has not had a sleep study  Has some UUI; happens when he gets closer to a bathroom  Not wearing any pads/protective undergarments. Sometimes will get his underpants wet  Bowel habits: Daily, soft, normal  No caffeine  Beer 1-2 times per week  Denies hematuria    He has other non-urology things he wanted to discuss today like left foot pain and some c hronic shoulder pain    Allergies   Allergen Reactions    Poison Ivy Extract      Poison Ivy- Rash per patient       Prior to Admission medications    Medication Sig Start Date End Date Taking? Authorizing Provider   aspirin (ECOTRIN) 81 MG tablet Take 81 mg by mouth daily.    Historical Provider, MD   azithromycin (ZITHROMAX) 250 MG tablet Take 500 mg by mouth daily. For 4 days 04/22/18   Historical Provider, MD   benzonatate (TESSALON) 100 MG capsule Take 200 mg by mouth Three (3) times a day as needed. 04/22/18   Historical Provider, MD   enzalutamide Calvin Obrien) 40 mg capsule Take 4 capsules (160 mg) by mouth daily. 03/18/19   Calvin Netters, MD   ezetimibe (ZETIA) 10 mg tablet Take 10 mg by mouth daily. Frequency:QD   Dosage:10   MG  Instructions:  Note:Dose: 10MG  08/18/11   Historical Provider, MD   gabapentin (NEURONTIN) 600 MG tablet Take 600 mg by mouth Two (2) times a day. 03/20/18   Historical Provider, MD   hydroCHLOROthiazide (HYDRODIURIL) 25 MG tablet Take 1 tablet (25 mg total) by mouth daily. 08/22/18   Daylene Posey, CPP   lisinopril (PRINIVIL,ZESTRIL) 40 MG tablet Take 40 mg by mouth daily.  02/28/16   Historical Provider, MD methylphenidate HCl (RITALIN) 10 MG tablet Take 1 tablet (10 mg total) by mouth daily. 08/08/18   Calvin Netters, MD   mirtazapine (REMERON) 7.5 MG tablet Take 1 tablet (7.5 mg total) by mouth nightly. 09/09/18   Daylene Posey, CPP   omeprazole (PRILOSEC) 20 MG capsule Take 20 mg by mouth daily.    Historical Provider, MD  oxybutynin (DITROPAN) 5 MG tablet Take 1 tablet (5 mg total) by mouth Three (3) times a day. 03/08/17   Valora Corporal, MD   oxyCODONE (ROXICODONE) 5 MG immediate release tablet Take 1 tablet (5 mg total) by mouth every four (4) hours as needed for pain. for up to 10 doses 05/24/17   Turner Daniels, MD   pregabalin (LYRICA) 150 MG capsule Take 1 capsule (150 mg total) by mouth three (3) times a day (at 6am, noon and 6pm). 07/20/16   Lorelee Market, FNP   tamsulosin (FLOMAX) 0.4 mg capsule Take 1 capsule (0.4 mg total) by mouth daily. 03/08/17   Valora Corporal, MD   traMADol (ULTRAM) 50 mg tablet Take 50 mg by mouth every six (6) hours as needed for pain.    Historical Provider, MD   triamcinolone (KENALOG) 0.1 % cream daily. 04/15/18   Historical Provider, MD   VENTOLIN HFA 90 mcg/actuation inhaler Inhale 1 puff every four (4) hours as needed.  03/16/15   Historical Provider, MD     Past Medical History:   Diagnosis Date    Acid reflux     Hyperlipemia     Hypertension     Prostate cancer (CMS-HCC)     bone metastasis       GENERAL: Pleasant, NAD.   VS: Reviewed and unremarkable  Head: normocephalic  Eyes: sclera aniecteric  Pulm: Non labored WOB  Skin: Warm, dry  Msk: No Le edema  NEURO: A&O x 3      Data:    Lab Results   Component Value Date    PSA <0.04 02/02/2022    PSA <0.04 10/13/2021    PSA <0.04 06/30/2021    PSA <0.04 03/17/2021    PSA <0.04 11/25/2020    PSA <0.04 08/26/2020

## 2022-04-18 NOTE — Unmapped (Addendum)
Bladder retraining: Timed voiding every 2 hours while awake. Hold the urge as long as you can  Decrease nighttime fluid intake and stop all fluids at least one hour before bed  Void right before lying down  Consider doing pelvic floor physical therapy  Continue tamsulosin, oxybutynin, and mirabegron   Talk to your primary care about a sleep study since you snore, because you may have a condition called sleep apnea that wakes you up at night to pee  Okay to buy some pads to put in your underwear that are made for men  Websites for products below      [email or web address]  [email or web address]    [name], NP-C, OCN  Adult Nurse Practitioner  Urology and Medical Oncology    Notice: Starting July 14, 2019, many test results will be automatically released into MyChart. This may happen before your provider has a chance to review them. Your results will either be reviewed with you at your scheduled appointment or your provider or someone from their office will reach out to you to discuss the results. Thank you for your understanding during this transition.     If you have been prescribed a medication today, always read the package insert that comes with the medication.    In the event of an emergency, always call 911    Urology:  Main clinic & [place]: [phone]  Fax: [phone]    Cancer Hospital:  Phone: [phone]  Fax: [phone]    After hours/nights/weekends:  Hospital Operator: [phone]    [email or web address]  [email or web address]

## 2022-04-27 NOTE — Unmapped (Signed)
[facility] Refill Coordination Note    Specialty Medication(s) to be Shipped:   Hematology/Oncology: Calvin Obrien    Other medication(s) to be shipped: No additional medications requested for fill at this time     Calvin Obrien, DOB: 1950/07/06  Phone: [phone] (home)       All above HIPAA information was verified with patient.     Was a Nurse, learning disability used for this call? No    Completed refill call assessment today to schedule patient's medication shipment from the [facility] [phone]).  All relevant notes have been reviewed.     Specialty medication(s) and dose(s) confirmed: Regimen is correct and unchanged.   Changes to medications: [name] reports no changes at this time.  Changes to insurance: No  New side effects reported not previously addressed with a pharmacist or physician: None reported  Questions for the pharmacist: No    Confirmed patient received a Conservation officer, historic buildings and a Surveyor, mining with first shipment. The patient will receive a drug information handout for each medication shipped and additional FDA Medication Guides as required.       DISEASE/MEDICATION-SPECIFIC INFORMATION        N/A    SPECIALTY MEDICATION ADHERENCE     Medication Adherence    Patient reported X missed doses in the last month: 0  Specialty Medication: Xtandi 40mg   Patient is on additional specialty medications: No  Informant: patient                       Were doses missed due to medication being on hold? No    Xtandi 40mg   : 10 days of medicine on hand       REFERRAL TO PHARMACIST     Referral to the pharmacist: Not needed      [facility]     Shipping address confirmed in Epic.     Delivery Scheduled: Yes, Expected medication delivery date: 05/03/2022.     Medication will be delivered via UPS to the prescription address in Epic [place].    [name]   [facility] Specialty Technician

## 2022-05-02 MED FILL — XTANDI 40 MG TABLET: ORAL | 30 days supply | Qty: 120 | Fill #9

## 2022-05-04 ENCOUNTER — Ambulatory Visit: Admit: 2022-05-04 | Payer: MEDICARE | Attending: Medical Oncology | Primary: Medical Oncology

## 2022-05-04 ENCOUNTER — Ambulatory Visit: Admit: 2022-05-04 | Payer: MEDICARE

## 2022-05-24 NOTE — Unmapped (Signed)
[facility] Refill Coordination Note    Specialty Medication(s) to be Shipped:   Hematology/Oncology: Calvin Obrien    Other medication(s) to be shipped: No additional medications requested for fill at this time     Calvin Obrien, DOB: 1950/04/26  Phone: [phone] (home)       All above HIPAA information was verified with patient.     Was a Nurse, learning disability used for this call? No    Completed refill call assessment today to schedule patient's medication shipment from the [facility] [phone]).  All relevant notes have been reviewed.     Specialty medication(s) and dose(s) confirmed: Regimen is correct and unchanged.   Changes to medications: Calvin Obrien reports no changes at this time.  Changes to insurance: No  New side effects reported not previously addressed with a pharmacist or physician: Yes - Patient reports light spots on legs. Patient would like to speak to the pharmacist today. Their provider is not aware.  Questions for the pharmacist: No    Confirmed patient received a Conservation officer, historic buildings and a Surveyor, mining with first shipment. The patient will receive a drug information handout for each medication shipped and additional FDA Medication Guides as required.       DISEASE/MEDICATION-SPECIFIC INFORMATION        N/A    SPECIALTY MEDICATION ADHERENCE     Medication Adherence    Patient reported X missed doses in the last month: 0  Specialty Medication: Xtandi 40mg   Patient is on additional specialty medications: No  Informant: patient     Were doses missed due to medication being on hold? No    Xtandi 40mg : 10 days of medicine on hand       REFERRAL TO PHARMACIST     Referral to the pharmacist: Not needed      [facility]     Shipping address confirmed in Epic.     Delivery Scheduled: Yes, Expected medication delivery date: 06/02/2022.     Medication will be delivered via UPS to the prescription address in Epic [place].    [name]   [facility] Specialty Technician

## 2022-05-24 NOTE — Unmapped (Signed)
[facility] Clinical Intervention    Type of intervention: Side effect management    Medication involved: Xtandi    Problem identified: Patient reports areas of discoloration on legs, white patchy spots    Intervention performed: Not very likely but Xtandi can cause xeroderma.  Recommened taking warm (not hot) showers and applying Eucerin or Cetaphil at least twice daily.  If areas are spreading or not improving he can contact his provider for further guidance.  He has an appointment with Dr. Vernell Barrier on 05/25/22    Follow-up needed: no    Approximate time spent: 5-10 minutes    Clinical evidence used to support intervention: Professional judgement    Result of the intervention: Improved medication adherence    Kermit Balo, [facility]   [facility] Specialty Pharmacist

## 2022-05-25 ENCOUNTER — Institutional Professional Consult (permissible substitution): Admit: 2022-05-25 | Discharge: 2022-05-25 | Payer: MEDICARE

## 2022-05-25 ENCOUNTER — Other Ambulatory Visit: Admit: 2022-05-25 | Discharge: 2022-05-25 | Payer: MEDICARE

## 2022-05-25 ENCOUNTER — Ambulatory Visit: Admit: 2022-05-25 | Discharge: 2022-05-25 | Payer: MEDICARE | Attending: Medical Oncology | Primary: Medical Oncology

## 2022-05-25 DIAGNOSIS — C61 Malignant neoplasm of prostate: Principal | ICD-10-CM

## 2022-05-25 DIAGNOSIS — C7951 Secondary malignant neoplasm of bone: Principal | ICD-10-CM

## 2022-05-25 DIAGNOSIS — R351 Nocturia: Principal | ICD-10-CM

## 2022-05-25 LAB — PSA: PROSTATE SPECIFIC ANTIGEN: <0.04 ng/mL (ref 0.00–4.00)

## 2022-05-25 MED ORDER — MIRABEGRON ER 25 MG TABLET,EXTENDED RELEASE 24 HR
ORAL_TABLET | Freq: Every day | ORAL | 6 refills | 30 days | Status: CP
Start: 2022-05-25 — End: ?

## 2022-05-25 MED ADMIN — leuprolide (LUPRON) injection 22.5 mg: 22.5 mg | INTRAMUSCULAR | @ 16:00:00 | Stop: 2022-05-25

## 2022-05-25 NOTE — Unmapped (Signed)
Put in urgent referral to Derm for patient for skin changes possibly medication related.  Called Derm to make them aware that the appointment needs to be expedited.

## 2022-05-25 NOTE — Unmapped (Signed)
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  After adding enzalutamide, PSA dropped to non-detectable.    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  He is followed in a pain clinic and has seen Palliative Care.    He is feeling very well with good energy.    Based on PSA values and how he is feeling, we will continue current therapy with Lupron and enzalutamide.    Lab Results   Component Value Date    PSA <0.04 05/25/2022    PSA <0.04 02/02/2022    PSA <0.04 10/13/2021    PSA <0.04 06/30/2021    PSA <0.04 03/17/2021    PSA <0.04 11/25/2020       Plan:   - Continue 52-month Lupron 22.5mg  (02/02/22).  (I tried to change to 35-month injection but insurance refused this apparently.)  - Pain - has been managed by palliative care.  - Dose reduce enzalutamide to see if improves his skin reaction (dry irritated skin on face. Legs) - two 40mg  tablets in the morning only now (previously switched to 40 mg tablets because he was mechanically unable to swallow the larger tablets).  If skin issues persist and pending Dermatology assessment, could consider trying to change ARSI drugs.  - Face/leg rash, possibly related to enzalutamide?  Will refer to Dermatology.  - He was previously referred to [facility] for ED assessment (and can also discuss urinary leakage).  - Follow up PSA.  - He will follow up with his pain clinic and with palliative Care here as warranted.  - He will meet about gas money today to help him out.  - Urinary leakage: He has seen Barkley Bruns, NP for evaluation. I recommended pads.  - BMD scan was done 11/25/20 with low bone density - started Fosamax.  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  - For ED, has seen Dr. Colon Branch in the past.  Viagra does not help.  He has spoken with Urology about options.    - Spine: He had prior decompression, now doing back exercises with substantial improvement of symptoms.  - Hot flashes - on Gabapentin 300 TID, Katina Degree PhD has counseled, pharmacy seeing today again. Stable  -   Code Status: Prior     I spent 42 minutes on records review, meeting with the patient, documentation and coordination on the day of service.    RTC in 3 months for next Lupron, and to check health status.      -------------------------------    Other Physicians:   Dr. Maryruth Eve [facility] Pain Medicine)  Dr. Carnella Guadalajara [facility] PM&R)  Dr. Nolon Rod (referring; Radiation Oncology)   Dr. Baxter Flattery (Urology)   Dr. Nuala Alpha (Anesthesia pain)   Dr. Hoy Morn (Urology, RE: ED)   Dr. 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     Current therapy: Lupron    Oncology history:   - Diagnosed 2008 with prostate  cancer, PSA 13.5.   - Prostate biopsy on 04/02/07 w Gleason 3+4=7   - 2009 Radiation therapy to w Dr. Nonie Hoyer 78 Gy.   - 2012-13: Rising PSA   - 11/13: Saw [facility] rad onc, felt not candidate for salvage RT   - 2/14: BS c/w 2009 with suspicious increased uptake at T5, L inferior iliac bone, subtle at L1.   - CT A/P shows sclerosis at L4-5 equivocal for DJD vs metastases, I discussed w radiology, felt most likely metastatic.   - 3/14: Lupron started   - 12/14: Lupron held for intermittent approach  - 11/13/13: Restarted Lupron due to PSA rise with worsening back pain  - 8/15: Palliative RT to spine in [place], [place].  - 05/25/14:  MRI spine: Multilevel degenerative changes, most prominent at L3-4 and L4-L5, where there is marked spinal canal stenosis and crowding with potential indentation of the nerve roots. Severe bilateral neural foraminal narrowing at L4-5 and moderate to marked neuroforaminal narrowing at L3-4 on the right. Additional multilevel degenerative changes as outlined above.    - 08/15/15: Abnormal focal marrow signal and enhancement at the endplates surrounding the L4-5 disc space are favored to represent acutely inflamed Schmorl's nodes. Metastatic disease is felt less likely but is not entirely excluded, particularly given enlargement of the lesion at the L5 level. Continued follow-up recommended.  - 6/17: Bone mineral density: The femoral neck density is mildly low, but the spine and total femoral densities are normal.  - 10/17: L2-S1 decompression surgery w Dr. 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  - 12/17: L ankle pain, negative xray.  - 04/20/18: CT A/P: Stable heterogeneity of the left iliac wing representing known metastatic disease. No new evidence of metastatic disease within the abdomen/pelvis.  - 04/25/18: Bone scan: Increased radiotracer activity within the left iliac bone concerning for osseous metastases.  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  Ongoing back pain w L sciatica.  Hot flashes persist on Lupron, daily.  Nocturia/chronic dysuria: much better with Ditropan 0-1x/night.  No pain.  Persistent ED.  Remainder of 10 system ROS otherwise negative.    Physical exam:   BP 141/72  - Pulse 60  - Temp 36.4 ??C (97.5 ??F) (Oral)  - Ht 168.9 cm (5' 6.5)  - Wt 83.3 kg (183 lb 9.6 oz)  - SpO2 100%  - BMI 29.19 kg/m??   NAD  A+Ox3  No visible rash  No dyspnea or cough  No apparent neurological deficit    Current Outpatient Medications   Medication Sig Dispense Refill    alendronate (FOSAMAX) 70 MG tablet Take 1 tablet (70 mg total) by mouth every seven (7) days. 4 tablet 11    DULERA 200-5 mcg/actuation HFAA 20 mcg Two (2) times a day.      enzalutamide (XTANDI) 40 mg tablet Take 4 tablets (160 mg total) by mouth daily. Swallow tablets whole. Do not chew, crush or split tablets. 120 tablet 11    ezetimibe (ZETIA) 10 mg tablet Take 1 tablet (10 mg total) by mouth daily. Frequency:QD   Dosage:10   MG  Instructions:  Note:Dose: 10MG       gabapentin (NEURONTIN) 600 MG tablet Take 1 tablet (600 mg total) by mouth two (2) times a day.      hydroCHLOROthiazide (HYDRODIURIL) 25 MG tablet Take 1  tablet (25 mg total) by mouth daily. 30 tablet 11    lisinopril (PRINIVIL,ZESTRIL) 40 MG tablet Take 1 tablet (40 mg total) by mouth daily.      MYRBETRIQ 25 mg Tb24 extended-release tablet TAKE 1 TABLET BY MOUTH ONCE DAILY. 30 tablet 6    omeprazole (PRILOSEC) 20 MG capsule Take 1 capsule (20 mg total) by mouth daily.      oxybutynin (DITROPAN) 5 MG tablet Take by mouth. 5 mg qAM and 15 mg qPM  0    oxyCODONE (ROXICODONE) 5 MG immediate release tablet 1 tablet (5 mg total)  in the morning.      predniSONE (DELTASONE) 20 MG tablet 1 tablet (20 mg total)  in the morning.      pregabalin (LYRICA) 150 MG capsule Take 1 capsule (150 mg total) by mouth three (3) times a day (at 6am, noon and 6pm). 90 capsule 1    sildenafiL (VIAGRA) 100 MG tablet Take 1 tablet (100 mg total) by mouth nightly as needed for erectile dysfunction. 90 tablet 3    tadalafil 20 MG tablet Take 1 tablet (20 mg total) by mouth in the morning. 90 tablet 3    tamsulosin (FLOMAX) 0.4 mg capsule Take 1 capsule (0.4 mg total) by mouth daily. 30 capsule 11    traMADol (ULTRAM) 50 mg tablet Take 1 tablet (50 mg total) by mouth every six (6) hours as needed for pain.       No current facility-administered medications for this visit.       Allergies   Allergen Reactions    Poison Ivy Extract      Poison Ivy- Rash per patient       Laboratory:   06/27/12: PSA=3 (on Casodex), testosterone >800     Lab Results   Component Value Date    PSA Diagnostic 0.6 05/14/2014    PSA Diagnostic 0.5 02/05/2014    PSA Diagnostic 6.9 (H) 11/06/2013    PSA Diagnostic 2.4 07/24/2013    PSA Diagnostic 0.3 03/20/2013    PSA Diagnostic 0.2 12/26/2012       Appointment on 05/25/2022   Component Date Value Ref Range Status    PSA 05/25/2022 <0.04  0.00 - 4.00 ng/mL Final     Lab Results   Component Value Date    PSA <0.04 05/25/2022    PSA <0.04 02/02/2022    PSA <0.04 10/13/2021    PSA <0.04 06/30/2021    PSA <0.04 03/17/2021    PSA <0.04 11/25/2020     3/17: Thyroid function WNL.

## 2022-05-25 NOTE — Unmapped (Signed)
Talk with patient about urinary symptoms.  Gave him the recommendations of Barkley Bruns, NP at his visit with her last month.  Patient unsure if he is taking meds Barkley Bruns recommended.  I told him I would call him tomorrow to do a medication review.

## 2022-05-25 NOTE — Unmapped (Signed)
Pt tolerated Lupron injection to  right dorsogluteal upper outer quadrant without difficulty. Band aid and gauze placed over injection sites. [facility] left [facility] ambulatory, steady gait, NAD, no questions, complaints, nor concerns voiced at d/c.

## 2022-05-26 DIAGNOSIS — I10 Essential (primary) hypertension: Principal | ICD-10-CM

## 2022-05-26 NOTE — Unmapped (Signed)
Returned Call to Calvin Obrien after receiving call from the communication center in reference to a medication that he forgot to tell her about: Hydrochlorothiazide 25 mg  PO every day.     Triage Recommendations:   I advised that I would update [name] so that she is aware.     Caller's Response:   He was  appreciative of the call.       Outstanding tasks: Care team notified

## 2022-05-26 NOTE — Unmapped (Signed)
Hi,     Clancey contacted the [employer] regarding the following:    - States that he is calling to speak with NN Konrad Penta again, states that he forgot to tell her one other medication he is taking    Please contact Gaetano at [phone].    Thanks in advance,    Rosary Lively  [facility]   [phone]

## 2022-05-26 NOTE — Unmapped (Signed)
Called patient and did a medication review with him.  I wanted to make sure he was taking all of the urinary medications that Barkley Bruns, NP had prescribed to him.  He is indeed taking as prescribed: Tamsulosin, Oxybutynin, and Myrbetriq.  I encouraged patient to continue all of these medications until he returns to see [facility] in May.      He ask could he see Corrie Dandy on the same day as Vernell Barrier because of the distance.  I told him that I would make the request on his behalf.

## 2022-06-02 MED FILL — XTANDI 40 MG TABLET: ORAL | 30 days supply | Qty: 120 | Fill #10

## 2022-06-12 NOTE — Unmapped (Signed)
Called patient and informed him that dermatology has been trying to call him to schedule an appointment.  He states he still has facial race.  I gave patient the number to call dermatology to schedule.  Patient states he will call them.

## 2022-06-21 ENCOUNTER — Ambulatory Visit: Admit: 2022-06-21 | Payer: MEDICARE | Attending: Podiatrist | Primary: Podiatrist

## 2022-06-30 NOTE — Unmapped (Signed)
Calvin Obrien has been contacted in regards to their refill of Xtandi. At this time, they have declined refill due to  taking 2QD now . Refill assessment call date has been updated per the patient's request.

## 2022-07-27 DIAGNOSIS — C7951 Secondary malignant neoplasm of bone: Principal | ICD-10-CM

## 2022-07-27 DIAGNOSIS — C61 Malignant neoplasm of prostate: Principal | ICD-10-CM

## 2022-07-27 MED ORDER — XTANDI 40 MG TABLET
ORAL_TABLET | Freq: Every day | ORAL | 11 refills | 30 days
Start: 2022-07-27 — End: 2022-08-26

## 2022-07-28 MED ORDER — ENZALUTAMIDE 40 MG TABLET
ORAL_TABLET | Freq: Every day | ORAL | 0 refills | 30 days | Status: CP
Start: 2022-07-28 — End: 2022-08-27

## 2022-07-28 NOTE — Unmapped (Signed)
Please refill if appropriate.    Most recent clinic visit: 05/25/2022    Next clinic visit: 08/24/2022

## 2022-07-31 DIAGNOSIS — C7951 Secondary malignant neoplasm of bone: Principal | ICD-10-CM

## 2022-07-31 DIAGNOSIS — C61 Malignant neoplasm of prostate: Principal | ICD-10-CM

## 2022-07-31 MED ORDER — ENZALUTAMIDE 40 MG TABLET
ORAL_TABLET | Freq: Every day | ORAL | 5 refills | 30 days | Status: CP
Start: 2022-07-31 — End: ?
  Filled 2022-08-04: qty 60, 30d supply, fill #0

## 2022-08-01 NOTE — Unmapped (Signed)
Clinical Assessment Needed For: Dose Change  Medication: Xtandi  Last Fill Date/Day Supply: 3-1 / 30  Copay $0  Was previous dose already scheduled to fill: No    Notes to Pharmacist:

## 2022-08-04 NOTE — Unmapped (Signed)
[facility] Clinical Assessment & Refill Coordination Note    Calvin Obrien, DOB: 09/05/1950  Phone: [phone] (home)     All above HIPAA information was verified with patient.     Was a Nurse, learning disability used for this call? No    Specialty Medication(s):   Hematology/Oncology: Calvin Obrien     Current Outpatient Medications   Medication Sig Dispense Refill    alendronate (FOSAMAX) 70 MG tablet Take 1 tablet (70 mg total) by mouth every seven (7) days. 4 tablet 11    DULERA 200-5 mcg/actuation HFAA 20 mcg Two (2) times a day.      enzalutamide (XTANDI) 40 mg tablet Take 2 tablets (80 mg total) by mouth daily. Swallow tablets whole. Do not chew, crush or split tablets. 60 tablet 5    ezetimibe (ZETIA) 10 mg tablet Take 1 tablet (10 mg total) by mouth daily. Frequency:QD   Dosage:10   MG  Instructions:  Note:Dose: 10MG       gabapentin (NEURONTIN) 600 MG tablet Take 1 tablet (600 mg total) by mouth two (2) times a day.      hydroCHLOROthiazide (HYDRODIURIL) 25 MG tablet Take 1 tablet (25 mg total) by mouth daily.      lisinopril (PRINIVIL,ZESTRIL) 40 MG tablet Take 1 tablet (40 mg total) by mouth daily.      mirabegron (MYRBETRIQ) 25 mg Tb24 extended-release tablet Take 1 tablet (25 mg total) by mouth daily. 30 tablet 6    omeprazole (PRILOSEC) 20 MG capsule Take 1 capsule (20 mg total) by mouth daily.      oxybutynin (DITROPAN) 5 MG tablet Take by mouth. 5 mg qAM and 15 mg qPM  0    oxyCODONE (ROXICODONE) 5 MG immediate release tablet 1 tablet (5 mg total)  in the morning.      sildenafiL (VIAGRA) 100 MG tablet Take 1 tablet (100 mg total) by mouth nightly as needed for erectile dysfunction. 90 tablet 3    tadalafil 20 MG tablet Take 1 tablet (20 mg total) by mouth in the morning. 90 tablet 3    tamsulosin (FLOMAX) 0.4 mg capsule Take 1 capsule (0.4 mg total) by mouth daily. 30 capsule 11    traMADol (ULTRAM) 50 mg tablet Take 1 tablet (50 mg total) by mouth every six (6) hours as needed for pain.       No current facility-administered medications for this visit.        Changes to medications: Calvin Obrien reports no changes at this time.    Allergies   Allergen Reactions    Poison Ivy Extract      Poison Ivy- Rash per patient       Changes to allergies: No    SPECIALTY MEDICATION ADHERENCE     Xtandi 40 mg: 1 days of medicine on hand     Medication Adherence    Patient reported X missed doses in the last month: 0  Specialty Medication: Xtandi 40 mg tablets - 2 tabs (80 mg) once daily per patient  Patient is on additional specialty medications: No  Informant: patient  Confirmed plan for next specialty medication refill: delivery by pharmacy  Refills needed for supportive medications: not needed          Specialty medication(s) dose(s) confirmed:  Dose reduced to 80 mg once daily      Are there any concerns with adherence? No - dose reduced so had additional meds on hand    Adherence counseling provided? Not  needed    CLINICAL MANAGEMENT AND INTERVENTION      Clinical Benefit Assessment:    Do you feel the medicine is effective or helping your condition? Yes    Clinical Benefit counseling provided? Not needed    Adverse Effects Assessment:    Are you experiencing any side effects? Yes, patient reports experiencing discoloration on top of foot. Side effect counseling provided: already discussed with provider    Are you experiencing difficulty administering your medicine? No    Quality of Life Assessment:    Quality of Life    Rheumatology  Oncology  1. What impact has your specialty medication had on the reduction of your daily pain or discomfort level?: Some  2. On a scale of 1-10, how would you rate your ability to manage side effects associated with your specialty medication? (1=no issues, 10 = unable to take medication due to side effects): 2  Dermatology  Cystic Fibrosis          How many days over the past month did your prostate cancer  keep you from your normal activities? For example, brushing your teeth or getting up in the morning. 0    Have you discussed this with your provider? Not needed    Acute Infection Status:    Acute infections noted within Epic:  No active infections  Patient reported infection: None    Therapy Appropriateness:    Is therapy appropriate and patient progressing towards therapeutic goals? Yes, therapy is appropriate and should be continued    DISEASE/MEDICATION-SPECIFIC INFORMATION      N/A    Oncology: Is the patient receiving adequate infection prevention treatment? Not applicable  Does the patient have adequate nutritional support? Not applicable    PATIENT SPECIFIC NEEDS     Does the patient have any physical, cognitive, or cultural barriers? No    Is the patient high risk? No    Did the patient require a clinical intervention? No    Does the patient require physician intervention or other additional services (i.e., nutrition, smoking cessation, social work)? No    SOCIAL DETERMINANTS OF HEALTH     At the [facility], we have learned that life circumstances - like trouble affording food, housing, utilities, or transportation can affect the health of many of our patients.   That is why we wanted to ask: are you currently experiencing any life circumstances that are negatively impacting your health and/or quality of life? Patient declined to answer    Social Determinants of Health     Financial Resource Strain: Not on file   Internet Connectivity: Not on file   Food Insecurity: Not on file   Tobacco Use: High Risk (04/18/2022)    Patient History     Smoking Tobacco Use: Some Days     Smokeless Tobacco Use: Never     Passive Exposure: Not on file   Housing/Utilities: Not on file   Alcohol Use: Not on file   Transportation Needs: Not on file   Substance Use: Not on file   Health Literacy: Not on file   Physical Activity: Not on file   Interpersonal Safety: Not on file   Stress: Not on file   Intimate Partner Violence: Not on file   Depression: Not on file   Social Connections: Not on file     Would you be willing to receive help with any of the needs that you have identified today? Not applicable  SHIPPING     Specialty Medication(s) to be Shipped:   Hematology/Oncology: Calvin Obrien    Other medication(s) to be shipped: No additional medications requested for fill at this time     Changes to insurance: No    Delivery Scheduled: Yes, Expected medication delivery date: 08/07/22.     Medication will be delivered via UPS to the confirmed prescription address in [facility].    The patient will receive a drug information handout for each medication shipped and additional FDA Medication Guides as required.  Verified that patient has previously received a Conservation officer, historic buildings and a Surveyor, mining.    The patient or caregiver noted above participated in the development of this care plan and knows that they can request review of or adjustments to the care plan at any time.      All of the patient's questions and concerns have been addressed.    Kermit Balo, [facility]   [facility] Shared [facility] Specialty Pharmacist

## 2022-08-24 ENCOUNTER — Ambulatory Visit: Admit: 2022-08-24 | Payer: MEDICARE | Attending: Medical Oncology | Primary: Medical Oncology

## 2022-08-24 ENCOUNTER — Ambulatory Visit: Admit: 2022-08-24 | Payer: MEDICARE

## 2022-08-24 ENCOUNTER — Ambulatory Visit: Admit: 2022-08-24 | Payer: MEDICARE | Attending: Nurse Practitioner | Primary: Nurse Practitioner

## 2022-08-24 ENCOUNTER — Other Ambulatory Visit (INDEPENDENT_AMBULATORY_CARE_PROVIDER_SITE_OTHER): Payer: Self-pay | Admitting: Nurse Practitioner

## 2022-08-24 DIAGNOSIS — I739 Peripheral vascular disease, unspecified: Secondary | ICD-10-CM

## 2022-08-30 ENCOUNTER — Encounter (INDEPENDENT_AMBULATORY_CARE_PROVIDER_SITE_OTHER): Payer: 59 | Admitting: Nurse Practitioner

## 2022-08-30 ENCOUNTER — Encounter (INDEPENDENT_AMBULATORY_CARE_PROVIDER_SITE_OTHER): Payer: Medicare Other

## 2022-08-31 NOTE — Unmapped (Signed)
The [facility] has made a third and final attempt to reach this patient to refill the following medication:Xtandi.      We have left voicemails on the following phone numbers: [phone], have been unable to leave messages on the following phone numbers: [phone], and have sent a text message to the following phone numbers: [phone] .    Dates contacted: 5/15,21,30  Last scheduled delivery: 08/04/22    The patient may be at risk of non-compliance with this medication. The patient should call the [facility] at [phone]  Option 4, then Option 1: Oncology to refill medication.    [name] Hulda Humphrey   [facility] Specialty Technician

## 2022-09-05 NOTE — Unmapped (Signed)
[facility] Refill Coordination Note    Specialty Medication(s) to be Shipped:   Hematology/Oncology: Calvin Obrien    Other medication(s) to be shipped: No additional medications requested for fill at this time     Calvin Obrien, DOB: 01-08-1951  Phone: [phone] (home)       All above HIPAA information was verified with patient.     Was a Nurse, learning disability used for this call? No    Completed refill call assessment today to schedule patient's medication shipment from the [facility] [phone]).  All relevant notes have been reviewed.     Specialty medication(s) and dose(s) confirmed: Regimen is correct and unchanged.   Changes to medications: Deo reports no changes at this time.  Changes to insurance: No  New side effects reported not previously addressed with a pharmacist or physician: None reported  Questions for the pharmacist: No    Confirmed patient received a Conservation officer, historic buildings and a Surveyor, mining with first shipment. The patient will receive a drug information handout for each medication shipped and additional FDA Medication Guides as required.       DISEASE/MEDICATION-SPECIFIC INFORMATION        N/A    SPECIALTY MEDICATION ADHERENCE     Medication Adherence    Patient reported X missed doses in the last month: 0  Specialty Medication: XTANDI 40 mg tablet (enzalutamide)  Patient is on additional specialty medications: No  Patient is on more than two specialty medications: No  Any gaps in refill history greater than 2 weeks in the last 3 months: no  Demonstrates understanding of importance of adherence: yes              Were doses missed due to medication being on hold? No    XTANDI 40   mg: 2 days of medicine on hand        REFERRAL TO PHARMACIST     Referral to the pharmacist: Not needed      [facility]     Shipping address confirmed in Epic.       Delivery Scheduled: Yes, Expected medication delivery date: 09/07/22.     Medication will be delivered via UPS to the prescription address in Epic WAM.    Calvin Obrien   [facility] Specialty Technician

## 2022-09-06 MED FILL — XTANDI 40 MG TABLET: ORAL | 30 days supply | Qty: 60 | Fill #1

## 2022-09-25 ENCOUNTER — Telehealth: Payer: Self-pay | Admitting: Orthopedic Surgery

## 2022-10-09 NOTE — Unmapped (Signed)
[facility] Refill Coordination Note    Specialty Medication(s) to be Shipped:   Hematology/Oncology: Calvin Obrien    Other medication(s) to be shipped: No additional medications requested for fill at this time     Calvin Obrien, DOB: 29-Dec-1950  Phone: [phone] (home)       All above HIPAA information was verified with patient.     Was a Nurse, learning disability used for this call? No    Completed refill call assessment today to schedule patient's medication shipment from the [facility] [phone]).  All relevant notes have been reviewed.     Specialty medication(s) and dose(s) confirmed: Regimen is correct and unchanged.   Changes to medications: Calvin Obrien reports no changes at this time.  Changes to insurance: No  New side effects reported not previously addressed with a pharmacist or physician: None reported  Questions for the pharmacist: No    Confirmed patient received a Conservation officer, historic buildings and a Surveyor, mining with first shipment. The patient will receive a drug information handout for each medication shipped and additional FDA Medication Guides as required.       DISEASE/MEDICATION-SPECIFIC INFORMATION        N/A    SPECIALTY MEDICATION ADHERENCE     Medication Adherence    Patient reported X missed doses in the last month: 3  Specialty Medication: Xtandi 40 mg  Patient is on additional specialty medications: No  Patient is on more than two specialty medications: No  Any gaps in refill history greater than 2 weeks in the last 3 months: no  Demonstrates understanding of importance of adherence: yes  Informant: patient              Were doses missed due to medication being on hold? No    XTANDI 40  mg: 0 days of medicine on hand       REFERRAL TO PHARMACIST     Referral to the pharmacist: Not needed      [facility]     Shipping address confirmed in Epic.       Delivery Scheduled: Yes, Expected medication delivery date: 10/11/22.     Medication will be delivered via UPS to the prescription address in Epic WAM.    [name]   [facility] Specialty Technician

## 2022-10-10 DIAGNOSIS — C7951 Secondary malignant neoplasm of bone: Principal | ICD-10-CM

## 2022-10-10 DIAGNOSIS — C61 Malignant neoplasm of prostate: Principal | ICD-10-CM

## 2022-10-10 MED FILL — XTANDI 40 MG TABLET: ORAL | 30 days supply | Qty: 60 | Fill #2

## 2022-10-11 ENCOUNTER — Ambulatory Visit: Payer: 59 | Admitting: Orthopedic Surgery

## 2022-10-17 NOTE — Unmapped (Signed)
Spoke with patient. Sending Xtandi replacement package to prescription address via ups, signature required, for delivery on 7/19. No questions for the pharmacist.

## 2022-10-19 ENCOUNTER — Ambulatory Visit: Admit: 2022-10-19 | Discharge: 2022-10-19 | Payer: MEDICARE | Attending: Medical Oncology | Primary: Medical Oncology

## 2022-10-19 ENCOUNTER — Other Ambulatory Visit: Admit: 2022-10-19 | Discharge: 2022-10-19 | Payer: MEDICARE

## 2022-10-19 DIAGNOSIS — C7951 Secondary malignant neoplasm of bone: Principal | ICD-10-CM

## 2022-10-19 DIAGNOSIS — C61 Malignant neoplasm of prostate: Principal | ICD-10-CM

## 2022-10-19 LAB — CBC W/ AUTO DIFF
BASOPHILS ABSOLUTE COUNT: 0.1 10*9/L (ref 0.0–0.1)
BASOPHILS RELATIVE PERCENT: 0.9 %
EOSINOPHILS ABSOLUTE COUNT: 0.4 10*9/L (ref 0.0–0.5)
EOSINOPHILS RELATIVE PERCENT: 4.9 %
HEMATOCRIT: 36.6 % — ABNORMAL LOW (ref 39.0–48.0)
HEMOGLOBIN: 12.5 g/dL — ABNORMAL LOW (ref 12.9–16.5)
LYMPHOCYTES ABSOLUTE COUNT: 2 10*9/L (ref 1.1–3.6)
LYMPHOCYTES RELATIVE PERCENT: 25.5 %
MEAN CORPUSCULAR HEMOGLOBIN CONC: 34.2 g/dL (ref 32.0–36.0)
MEAN CORPUSCULAR HEMOGLOBIN: 31 pg (ref 25.9–32.4)
MEAN CORPUSCULAR VOLUME: 90.5 fL (ref 77.6–95.7)
MEAN PLATELET VOLUME: 8.6 fL (ref 6.8–10.7)
MONOCYTES ABSOLUTE COUNT: 0.7 10*9/L (ref 0.3–0.8)
MONOCYTES RELATIVE PERCENT: 8.2 %
NEUTROPHILS ABSOLUTE COUNT: 4.8 10*9/L (ref 1.8–7.8)
NEUTROPHILS RELATIVE PERCENT: 60.5 %
PLATELET COUNT: 234 10*9/L (ref 150–450)
RED BLOOD CELL COUNT: 4.04 10*12/L — ABNORMAL LOW (ref 4.26–5.60)
RED CELL DISTRIBUTION WIDTH: 13.1 % (ref 12.2–15.2)
WBC ADJUSTED: 7.9 10*9/L (ref 3.6–11.2)

## 2022-10-19 LAB — TESTOSTERONE: TESTOSTERONE TOTAL: 17 ng/dL — ABNORMAL LOW

## 2022-10-19 LAB — PSA: PROSTATE SPECIFIC ANTIGEN: <0.04 ng/mL (ref 0.00–4.00)

## 2022-10-19 MED ADMIN — leuprolide (LUPRON) injection 22.5 mg: 22.5 mg | INTRAMUSCULAR | @ 17:00:00 | Stop: 2022-10-19 | NDC 00300334601

## 2022-10-19 MED FILL — XTANDI 40 MG TABLET: ORAL | 30 days supply | Qty: 60 | Fill #3

## 2022-10-19 NOTE — Unmapped (Signed)
Pt tolerated Lupron injection to left dorsogluteal upper outer quadrant without difficulty. Band aid and gauze placed over injection site. [facility] left [facility] ambulatory, steady gait, NAD, no questions, complaints, nor concerns voiced at d/c.

## 2022-10-19 NOTE — Unmapped (Signed)
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  After adding enzalutamide, PSA dropped to non-detectable.    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  He is followed in a pain clinic and has seen Palliative Care.    He is feeling very well with good energy.    Based on prior PSA values and how he is feeling, we will continue current therapy with Lupron and enzalutamide.    Lab Results   Component Value Date    PSA <0.04 05/25/2022    PSA <0.04 02/02/2022    PSA <0.04 10/13/2021    PSA <0.04 06/30/2021    PSA <0.04 03/17/2021    PSA <0.04 11/25/2020       Plan:   - Follow up PSA.  - Continue 89-month Lupron 22.5mg  (02/02/22).  (I tried to change to 65-month injection but insurance refused this apparently.)  - Pain - has been managed by palliative care.  - Dose reduced enzalutamide to see if improves his skin reaction (dry irritated skin on face. Legs) - two 40mg  tablets in the morning only now (previously switched to 40 mg tablets because he was mechanically unable to swallow the larger tablets).  If skin issues persist and pending Dermatology assessment, could consider trying to change ARSI drugs.  - Face/leg rash, possibly related to enzalutamide?  Referred to Dermatology, still has not seen them, we will look into it.  - He was previously referred to [facility] for ED assessment (and can also discuss urinary leakage).  - Follow up PSA.  - He will follow up with his pain clinic and with palliative Care here as warranted.  - He will meet about gas money today to help him out.  - Urinary leakage: He has seen Barkley Bruns, NP for evaluation. I recommended pads.  - BMD scan was done 11/25/20 with low bone density - started Fosamax.  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  - For ED, has seen Dr. Colon Branch in the past.  Viagra does not help.  He has spoken with Urology about options.    - Spine: He had prior decompression, now doing back exercises with substantial improvement of symptoms.  - Hot flashes - on Gabapentin 300 TID, Katina Degree PhD has counseled, pharmacy seeing today again. Stable  -   Code Status: Prior     I spent 42 minutes on records review, meeting with the patient, documentation and coordination on the day of service.    RTC in 3 months for next Lupron, and to check health status.      -------------------------------    Other Physicians:   Dr. Maryruth Eve [facility])  Dr. Carnella Guadalajara [facility] PM&R)  Dr. Nolon Rod (referring; Radiation Oncology)   Dr. Baxter Flattery (Urology)   Dr. Nuala Alpha (Anesthesia pain)   Dr. Hoy Morn (Urology, RE: ED)   Dr. 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  Current therapy: Lupron    Oncology history:   - Diagnosed 2008 with prostate cancer, PSA 13.5.   - Prostate biopsy on 04/02/07 w Gleason 3+4=7   - 2009 Radiation therapy to w Dr. Nonie Hoyer 78 Gy.   - 2012-13: Rising PSA   - 11/13: Saw  rad onc, felt not candidate for salvage RT   - 2/14: BS c/w 2009 with suspicious increased uptake at T5, L inferior iliac bone, subtle at L1.   - CT A/P shows sclerosis at L4-5 equivocal for DJD vs metastases, I discussed w radiology, felt most likely metastatic.   - 3/14: Lupron started   - 12/14: Lupron held for intermittent approach  - 11/13/13: Restarted Lupron due to PSA rise with worsening back pain  - 8/15: Palliative RT to spine in [place], [place].  - 05/25/14:  MRI spine: Multilevel degenerative changes, most prominent at L3-4 and L4-L5, where there is marked spinal canal stenosis and crowding with potential indentation of the nerve roots. Severe bilateral neural foraminal narrowing at L4-5 and moderate to marked neuroforaminal narrowing at L3-4 on the right. Additional multilevel degenerative changes as outlined above.    - 08/15/15: Abnormal focal marrow signal and enhancement at the endplates surrounding the L4-5 disc space are favored to represent acutely inflamed Schmorl's nodes. Metastatic disease is felt less likely but is not entirely excluded, particularly given enlargement of the lesion at the L5 level. Continued follow-up recommended.  - 6/17: Bone mineral density: The femoral neck density is mildly low, but the spine and total femoral densities are normal.  - 10/17: L2-S1 decompression surgery w Dr. 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  - 12/17: L ankle pain, negative xray.  - 04/20/18: CT A/P: Stable heterogeneity of the left iliac wing representing known metastatic disease. No new evidence of metastatic disease within the abdomen/pelvis.  - 04/25/18: Bone scan: Increased radiotracer activity within the left iliac bone concerning for osseous metastases.  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  Ongoing back pain w L sciatica.  Hot flashes persist on Lupron, daily.  Nocturia/chronic dysuria: much better with Ditropan 0-1x/night.  No pain.  Persistent ED.  Remainder of 10 system ROS otherwise negative.    Physical exam:   There were no vitals taken for this visit.  NAD  A+Ox3  No visible rash  No dyspnea or cough  No apparent neurological deficit    Current Outpatient Medications   Medication Sig Dispense Refill    alendronate (FOSAMAX) 70 MG tablet Take 1 tablet (70 mg total) by mouth every seven (7) days. 4 tablet 11    DULERA 200-5 mcg/actuation HFAA 20 mcg Two (2) times a day.      enzalutamide (XTANDI) 40 mg tablet Take 2 tablets (80 mg total) by mouth daily. Swallow tablets whole. Do not chew, crush or split tablets. 60 tablet 5    ezetimibe (ZETIA) 10 mg tablet Take 1 tablet (10 mg total) by mouth daily. Frequency:QD   Dosage:10   MG  Instructions:  Note:Dose: 10MG       gabapentin (NEURONTIN) 600 MG tablet Take 1 tablet (600 mg total) by mouth two (2) times a day.      hydroCHLOROthiazide (HYDRODIURIL) 25 MG tablet Take 1 tablet (25 mg total) by mouth daily.      lisinopril (PRINIVIL,ZESTRIL) 40 MG tablet  Take 1 tablet (40 mg total) by mouth daily.      mirabegron (MYRBETRIQ) 25 mg Tb24 extended-release tablet Take 1 tablet (25 mg total) by mouth daily. 30 tablet 6    omeprazole (PRILOSEC) 20 MG capsule Take 1 capsule (20 mg total) by mouth daily.      oxybutynin (DITROPAN) 5 MG tablet Take by mouth. 5 mg qAM and 15 mg qPM  0    oxyCODONE (ROXICODONE) 5 MG immediate release tablet 1 tablet (5 mg total)  in the morning.      sildenafiL (VIAGRA) 100 MG tablet Take 1 tablet (100 mg total) by mouth nightly as needed for erectile dysfunction. 90 tablet 3    tadalafil 20 MG tablet Take 1 tablet (20 mg total) by mouth in the morning. 90 tablet 3    tamsulosin (FLOMAX) 0.4 mg capsule Take 1 capsule (0.4 mg total) by mouth daily. 30 capsule 11    traMADol (ULTRAM) 50 mg tablet Take 1 tablet (50 mg total) by mouth every six (6) hours as needed for pain.       No current facility-administered medications for this visit.       Allergies   Allergen Reactions    Poison Ivy Extract      Poison Ivy- Rash per patient       Laboratory:   06/27/12: PSA=3 (on Casodex), testosterone >800     Lab Results   Component Value Date    PSA Diagnostic 0.6 05/14/2014    PSA Diagnostic 0.5 02/05/2014    PSA Diagnostic 6.9 (H) 11/06/2013    PSA Diagnostic 2.4 07/24/2013    PSA Diagnostic 0.3 03/20/2013    PSA Diagnostic 0.2 12/26/2012       No visits with results within 2 Week(s) from this visit.   Latest known visit with results is:   Lab on 05/25/2022   Component Date Value Ref Range Status    PSA 05/25/2022 <0.04  0.00 - 4.00 ng/mL Final     Lab Results   Component Value Date    PSA <0.04 05/25/2022    PSA <0.04 02/02/2022    PSA <0.04 10/13/2021    PSA <0.04 06/30/2021    PSA <0.04 03/17/2021    PSA <0.04 11/25/2020     3/17: Thyroid function WNL.

## 2022-10-19 NOTE — Unmapped (Signed)
Addended by: Tawanna Sat on: 10/19/2022 12:36 PM     Modules accepted: Orders

## 2022-10-20 NOTE — Unmapped (Signed)
Called patient to give PSA results.  Patient appreciative of call.

## 2022-11-01 ENCOUNTER — Ambulatory Visit (INDEPENDENT_AMBULATORY_CARE_PROVIDER_SITE_OTHER): Payer: 59 | Admitting: Orthopedic Surgery

## 2022-11-01 ENCOUNTER — Other Ambulatory Visit (INDEPENDENT_AMBULATORY_CARE_PROVIDER_SITE_OTHER): Payer: 59

## 2022-11-01 DIAGNOSIS — Z96612 Presence of left artificial shoulder joint: Secondary | ICD-10-CM

## 2022-11-02 ENCOUNTER — Encounter: Payer: Self-pay | Admitting: Orthopedic Surgery

## 2022-11-02 NOTE — Progress Notes (Signed)
Office Visit Note   Patient: Willie Fowler           Date of Birth: [birth year]           MRN: [id number] Visit Date: 11/01/2022 Requested by: Alvina Filbert, MD [address] [place],  [place] [address] PCP: Alvina Filbert, MD  Subjective: Chief Complaint  Patient presents with   Left Shoulder - Follow-up    HPI: HASON OFARRELL is a 72 y.o. male who presents to the office reporting left shoulder pain.  Patient reports having a knot on the anterolateral aspect of the shoulder.  Underwent reverse shoulder replacement 5 years ago.  Does report some pain most days.  Denies any fevers and chills.  The pain wakes him from sleep at times..                ROS: All systems reviewed are negative as they relate to the chief complaint within the history of present illness.  Patient denies fevers or chills.  Assessment & Plan: Visit Diagnoses:  1. Status post reverse arthroplasty of shoulder, left     Plan: Impression is discrete mass anterolateral aspect of the shoulder below the acromion.  Does not look like the fluid collection on ultrasound.  Hard to say exactly what this is.  And is bothering him.  Radiographs unremarkable.  Shoulder motion otherwise reasonable.  Need metal suppressed MRI scan left shoulder to evaluate mass anterolateral to the acromion with a history of RSA.  Follow-up after that study.  Follow-Up Instructions: No follow-ups on file.   Orders:  Orders Placed This Encounter  Procedures   XR Shoulder Left   MR Shoulder Left w/o contrast   No orders of the defined types were placed in this encounter.     Procedures: No procedures performed   Clinical Data: No additional findings.  Objective: Vital Signs: There were no vitals taken for this visit.  Physical Exam:  Constitutional: Patient appears well-developed HEENT:  Head: Normocephalic Eyes:EOM are normal Neck: Normal range of motion Cardiovascular: Normal rate Pulmonary/chest: Effort  normal Neurologic: Patient is alert Skin: Skin is warm Psychiatric: Patient has normal mood and affect  Ortho Exam: Ortho examination demonstrates on the left range of motion of 80/90/100.  No warmth to the incision.  No lymphadenopathy around the left shoulder.  Deltoid is functional.  About a 3 x 3 cm mass is present around the anterolateral aspect of the acromion which is about 2 fingerbreadths lateral to the incision.  Not fluctuant but mildly tender.  Specialty Comments:  No specialty comments available.  Imaging: No results found.   PMFS History: Patient Active Problem List   Diagnosis Date Noted   Anemia 05/14/2018   Colon cancer (HCC) 05/14/2018    Class: History of   Rotator cuff arthropathy, left    Shoulder arthritis 12/18/2017   Pneumonia 01/14/2017   LLL pneumonia 01/14/2017   Acute respiratory distress 01/14/2017   Acute respiratory failure with hypoxia (HCC) 01/14/2017   Chronic back pain 01/14/2017   Opioid dependence (HCC) 01/14/2017   High cholesterol 01/14/2017   Current every day smoker 01/14/2017   Leukocytosis 01/14/2017   Constipation due to opioid therapy 01/14/2017   Hypertension 01/14/2017   Hyponatremia 01/14/2017   Dehydration 01/14/2017   Past Medical History:  Diagnosis Date   Arthritis    Cancer [facility])    Prostate cancer   Chronic ankle pain    Chronic back pain    Chronic left  shoulder pain    GERD (gastroesophageal reflux disease)    High cholesterol    History of alcohol abuse    Reportedly went through withdrawals after his 2017 back surgery.   Hypertension    Lumbar radiculopathy    Pain management    [facility]   Pneumonia     Family History  Problem Relation Age of Onset   Cancer Mother        breast   Cancer Sister        breast   Colon cancer Neg Hx     Past Surgical History:  Procedure Laterality Date   BACK SURGERY     COLONOSCOPY  2016   Dr. Allena Katz: two 4-6 mm sigmoid colon polyps removed (adenomatous), mild left  sided diverticulosis   COLONOSCOPY WITH PROPOFOL N/A 12/05/2018   Procedure: COLONOSCOPY WITH PROPOFOL;  Surgeon: Corbin Ade, MD;  [facility];  Service: Endoscopy;  Laterality: N/A;  9:15am - pt knows to arrive at 8am for Covid test   EAR CYST EXCISION Left 12/18/2017   Procedure: EXCISION CYST LEFT SHOULDER;  Surgeon: Cammy Copa, MD;  Location: [facility] OR;  Service: Orthopedics;  Laterality: Left;   POLYPECTOMY  12/05/2018   Procedure: POLYPECTOMY;  Surgeon: Corbin Ade, MD;  Location: [facility];  Service: Endoscopy;;  colon   REVERSE SHOULDER ARTHROPLASTY Left 12/18/2017   Procedure: LEFT REVERSE SHOULDER ARTHROPLASTY;  Surgeon: Cammy Copa, MD;  Location: [facility] OR;  Service: Orthopedics;  Laterality: Left;   TONSILLECTOMY     Social History   Occupational History   Not on file  Tobacco Use   Smoking status: Every Day    Current packs/day: 0.15    Average packs/day: 0.2 packs/day for 40.0 years (6.0 ttl pk-yrs)    Types: Cigarettes   Smokeless tobacco: Never   Tobacco comments:    "I smoke 2 cigs a day"  Vaping Use   Vaping status: Former  Substance and Sexual Activity   Alcohol use: Yes    Comment: *PT reports he quit drinking 08/19/19* weekly-  BEER (12 ounces every few days)   Drug use: No   Sexual activity: Not on file

## 2022-11-14 ENCOUNTER — Encounter (INDEPENDENT_AMBULATORY_CARE_PROVIDER_SITE_OTHER): Payer: 59 | Admitting: Vascular Surgery

## 2022-11-17 ENCOUNTER — Other Ambulatory Visit: Payer: 59

## 2022-11-23 NOTE — Unmapped (Addendum)
The [facility] has made a third and final attempt to reach this patient to refill the following medication:Xtandi.      We have left voicemails on the following phone numbers: [phone], have been unable to leave messages on the following phone numbers: [phone], and have sent a text message to the following phone numbers: [phone] .    Dates contacted: 8/9,15,22, 12/08/22  Last scheduled delivery: 10/19/22    The patient may be at risk of non-compliance with this medication. The patient should call the [facility] at [phone]  Option 4, then Option 1: Oncology to refill medication.    Calvin Obrien   [facility] Specialty Technician

## 2022-11-26 ENCOUNTER — Ambulatory Visit
Admission: RE | Admit: 2022-11-26 | Discharge: 2022-11-26 | Disposition: A | Discharge status: Home / Self Care | Payer: 59 | Source: Ambulatory Visit | Attending: Orthopedic Surgery | Admitting: Orthopedic Surgery

## 2022-11-26 DIAGNOSIS — Z96612 Presence of left artificial shoulder joint: Secondary | ICD-10-CM

## 2022-12-10 DIAGNOSIS — R351 Nocturia: Principal | ICD-10-CM

## 2022-12-10 MED ORDER — MYRBETRIQ 25 MG TABLET,EXTENDED RELEASE
ORAL_TABLET | Freq: Every day | ORAL | 6 refills | 0 days
Start: 2022-12-10 — End: ?

## 2022-12-11 MED ORDER — MYRBETRIQ 25 MG TABLET,EXTENDED RELEASE
ORAL_TABLET | Freq: Every day | ORAL | 6 refills | 30 days | Status: CP
Start: 2022-12-11 — End: ?

## 2022-12-11 NOTE — Unmapped (Signed)
Pt is requesting refill    Most recent clinic visit: 10/19/2022  Next clinic visit:  01/25/2023

## 2022-12-18 DIAGNOSIS — C7951 Secondary malignant neoplasm of bone: Principal | ICD-10-CM

## 2022-12-18 DIAGNOSIS — C61 Malignant neoplasm of prostate: Principal | ICD-10-CM

## 2022-12-18 NOTE — Unmapped (Signed)
Hi,    Patient Calvin Obrien called requesting a medication refill for the following:    Medication: Xtandi  Dosage: 40mg   Days left of medication: 4 pills left  Pharmacy: [facility]      The expected turnaround time is 3-4 business days     Thank you,  Durward Fortes  [facility]  [phone]

## 2022-12-19 DIAGNOSIS — I739 Peripheral vascular disease, unspecified: Principal | ICD-10-CM

## 2022-12-19 DIAGNOSIS — R0989 Other specified symptoms and signs involving the circulatory and respiratory systems: Principal | ICD-10-CM

## 2022-12-19 DIAGNOSIS — R52 Pain, unspecified: Principal | ICD-10-CM

## 2022-12-19 NOTE — Unmapped (Signed)
Referral received for painful veins, clinical notes suggest PAD. Arterial duplex ordered, ready to schedule.

## 2022-12-22 NOTE — Unmapped (Signed)
Returned call to pt.Let him know he needs to contact [facility] pharmacy and arrange delivery.Gave him the number to call.Asked that he cal back with any further questions/concerns.

## 2022-12-22 NOTE — Unmapped (Signed)
Hi,    Patient Calvin Obrien called requesting a medication refill for the following:    Medication: XTANDI  Dosage: 40mg   Days left of medication: 0  Pharmacy: [facility] in [place]                                 [phone]    Notnamed is calling in requesting a medication refill on his Calvin Obrien.    The expected turnaround time is 3-4 business days     Thank you,  Noland Fordyce  [facility] Communication [facility]  [phone]

## 2022-12-25 NOTE — Unmapped (Signed)
[facility] Refill Coordination Note    Specialty Medication(s) to be Shipped:   Hematology/Oncology: Calvin Obrien    Other medication(s) to be shipped: No additional medications requested for fill at this time     Calvin Obrien, DOB: [birth year]  Phone: [phone] (home)       All above HIPAA information was verified with patient.     Was a Nurse, learning disability used for this call? No    Completed refill call assessment today to schedule patient's medication shipment from the [facility]  [phone]).  All relevant notes have been reviewed.     Specialty medication(s) and dose(s) confirmed: Regimen is correct and unchanged.   Changes to medications: Neiko reports no changes at this time.  Changes to insurance: No  New side effects reported not previously addressed with a pharmacist or physician: None reported  Questions for the pharmacist: No    Confirmed patient received a Conservation officer, historic buildings and a Surveyor, mining with first shipment. The patient will receive a drug information handout for each medication shipped and additional FDA Medication Guides as required.       DISEASE/MEDICATION-SPECIFIC INFORMATION        N/A    SPECIALTY MEDICATION ADHERENCE     Medication Adherence    Patient reported X missed doses in the last month: 0  Specialty Medication: XTANDI 40 mg tablet (enzalutamide)  Patient is on additional specialty medications: No              Were doses missed due to medication being on hold? No    Xtandi 40 mg: 0 days of medicine on hand     REFERRAL TO PHARMACIST     Referral to the pharmacist: Not needed      [facility]     Shipping address confirmed in Epic.       Delivery Scheduled: Yes, Expected medication delivery date: 01/03/23.     Medication will be delivered via UPS to the prescription address in Epic WAM.    Calvin Obrien   [facility]  Specialty Technician

## 2022-12-30 MED ORDER — ALENDRONATE 70 MG TABLET
ORAL_TABLET | 11 refills | 0 days
Start: 2022-12-30 — End: ?

## 2023-01-01 MED ORDER — ALENDRONATE 70 MG TABLET
ORAL_TABLET | 11 refills | 0 days | Status: CP
Start: 2023-01-01 — End: ?

## 2023-01-01 NOTE — Unmapped (Signed)
Pt is requesting refill    Most recent clinic visit: 10/19/2022  Next clinic visit:  01/25/2023

## 2023-01-02 MED FILL — XTANDI 40 MG TABLET: ORAL | 30 days supply | Qty: 60 | Fill #4

## 2023-01-05 ENCOUNTER — Ambulatory Visit (INDEPENDENT_AMBULATORY_CARE_PROVIDER_SITE_OTHER): Payer: 59 | Admitting: Orthopedic Surgery

## 2023-01-05 ENCOUNTER — Encounter: Payer: Self-pay | Admitting: Orthopedic Surgery

## 2023-01-05 DIAGNOSIS — D1722 Benign lipomatous neoplasm of skin and subcutaneous tissue of left arm: Secondary | ICD-10-CM

## 2023-01-05 NOTE — Progress Notes (Signed)
Office Visit Note   Patient: Willie Fowler           Date of Birth: 04/08/1950           MRN: [id number] Visit Date: 01/05/2023 Requested by: Alvina Filbert, MD [address] [place],  [place] [address] PCP: Alvina Filbert, MD  Subjective: Chief Complaint  Patient presents with   Other     Scan review    HPI: Willie Fowler is a 72 y.o. male who presents to the office reporting left shoulder pain.  Since he was last seen has had an MRI scan which shows intact prosthesis and no change in size of lipoma off the anterolateral aspect of the acromion.  This measures about 4 cm.  This is where he is localizing his tenderness.  He would like to have this removed.  We considered it at the time of his reverse shoulder replacement but the incisions would be able to close together.  I think it safe for now to consider removing that lipoma as it is symptomatic for him..                ROS: All systems reviewed are negative as they relate to the chief complaint within the history of present illness.  Patient denies fevers or chills.  Assessment & Plan: Visit Diagnoses:  1. Lipoma of left shoulder     Plan: Impression is symptomatic left shoulder lipoma.  Plan is left shoulder lipoma excision.  The risk and benefits are discussed with the patient including not limited to infection or vessel damage incomplete pain relief as well as the remote possibility of infection seeding his total joint prosthesis.  Patient understands risk and benefits and wishes to proceed.  All questions answered  Follow-Up Instructions: No follow-ups on file.   Orders:  No orders of the defined types were placed in this encounter.  No orders of the defined types were placed in this encounter.     Procedures: No procedures performed   Clinical Data: No additional findings.  Objective: Vital Signs: There were no vitals taken for this visit.  Physical Exam:  Constitutional: Patient appears  well-developed HEENT:  Head: Normocephalic Eyes:EOM are normal Neck: Normal range of motion Cardiovascular: Normal rate Pulmonary/chest: Effort normal Neurologic: Patient is alert Skin: Skin is warm Psychiatric: Patient has normal mood and affect  Ortho Exam: Ortho exam demonstrates pretty reasonable forward flexion and abduction at left shoulder but he is tender over the anterolateral portion of the acromion where there is a 3 x 4 cm lipoma.  Does feel like it is within the muscle itself and not below the muscle.  No surrounding lymphadenopathy is present.  Motor or sensory function of the hand is intact.  Deltoid fires nicely.  Specialty Comments:  No specialty comments available.  Imaging: No results found.   PMFS History: Patient Active Problem List   Diagnosis Date Noted   Anemia 05/14/2018   Colon cancer (HCC) 05/14/2018    Class: History of   Rotator cuff arthropathy, left    Shoulder arthritis 12/18/2017   Pneumonia 01/14/2017   LLL pneumonia 01/14/2017   Acute respiratory distress 01/14/2017   Acute respiratory failure with hypoxia (HCC) 01/14/2017   Chronic back pain 01/14/2017   Opioid dependence (HCC) 01/14/2017   High cholesterol 01/14/2017   Current every day smoker 01/14/2017   Leukocytosis 01/14/2017   Constipation due to opioid therapy 01/14/2017   Hypertension 01/14/2017   Hyponatremia 01/14/2017  Dehydration 01/14/2017   Past Medical History:  Diagnosis Date   Arthritis    Cancer [facility])    Prostate cancer   Chronic ankle pain    Chronic back pain    Chronic left shoulder pain    GERD (gastroesophageal reflux disease)    High cholesterol    History of alcohol abuse    Reportedly went through withdrawals after his 2017 back surgery.   Hypertension    Lumbar radiculopathy    Pain management    [facility]   Pneumonia     Family History  Problem Relation Age of Onset   Cancer Mother        breast   Cancer Sister        breast   Colon cancer Neg  Hx     Past Surgical History:  Procedure Laterality Date   BACK SURGERY     COLONOSCOPY  2016   Dr. Allena Katz: two 4-6 mm sigmoid colon polyps removed (adenomatous), mild left sided diverticulosis   COLONOSCOPY WITH PROPOFOL N/A 12/05/2018   Procedure: COLONOSCOPY WITH PROPOFOL;  Surgeon: Corbin Ade, MD;  [facility];  Service: Endoscopy;  Laterality: N/A;  9:15am - pt knows to arrive at 8am for Covid test   EAR CYST EXCISION Left 12/18/2017   Procedure: EXCISION CYST LEFT SHOULDER;  Surgeon: Cammy Copa, MD;  Location: [facility] OR;  Service: Orthopedics;  Laterality: Left;   POLYPECTOMY  12/05/2018   Procedure: POLYPECTOMY;  Surgeon: Corbin Ade, MD;  Location: [facility];  Service: Endoscopy;;  colon   REVERSE SHOULDER ARTHROPLASTY Left 12/18/2017   Procedure: LEFT REVERSE SHOULDER ARTHROPLASTY;  Surgeon: Cammy Copa, MD;  Location: [facility] OR;  Service: Orthopedics;  Laterality: Left;   TONSILLECTOMY     Social History   Occupational History   Not on file  Tobacco Use   Smoking status: Every Day    Current packs/day: 0.15    Average packs/day: 0.2 packs/day for 40.0 years (6.0 ttl pk-yrs)    Types: Cigarettes   Smokeless tobacco: Never   Tobacco comments:    "I smoke 2 cigs a day"  Vaping Use   Vaping status: Former  Substance and Sexual Activity   Alcohol use: Yes    Comment: *PT reports he quit drinking 08/19/19* weekly-  BEER (12 ounces every few days)   Drug use: No   Sexual activity: Not on file

## 2023-01-23 NOTE — Unmapped (Signed)
Called patient and left message with appointment reminder.

## 2023-01-24 NOTE — Unmapped (Signed)
Attempted to call patient to verify he is aware of his appointment for tomorrow with Dr. Vernell Barrier. Patient's phone not in service.

## 2023-01-25 ENCOUNTER — Ambulatory Visit: Admit: 2023-01-25 | Payer: MEDICARE

## 2023-01-25 ENCOUNTER — Ambulatory Visit: Admit: 2023-01-25 | Payer: MEDICARE | Attending: Medical Oncology | Primary: Medical Oncology

## 2023-01-27 NOTE — Unmapped (Unsigned)
[facility] VASCULAR SURGERY      Patient Name: Calvin Obrien  Encounter Date: 01/30/2023  Referring provider: Nedra Hai  Surgeon:  Sherrie Sport, MD    ASSESSMENT     72 y.o. male with hx of {LOWEREXTREMITY:77824} PAD; {rutherford scale:70331}     Imaging findings are consistent with a greater than 70% stenosis *** of the *** Given that patient has been medically optimized for *** and continues to have lifestyle limiting claudication, will proceed with ***.       Angiography with intervention in addition to continued aggressive risk factor modification. Procedure, as well as risks and benefits were reviewed in detail with patient. Risks include bleeding from access site, distal embolization, failure to open artery, dissection and limb loss.     PLAN     Follow up in *** with ***. Return precautions reviewed.  Aggressive risk factor modification as below    Aggressive Risk Factor Modification     Medication management:  1.  Statin: Recommend high-dose statin therapy, regardless of baseline LDL, if no contraindication. If patient unable to tolerate statins or LDL persistently elevated despite statin therapy consider referral to lipidologist.  2.  Antiplatelet therapy: Recommend {AM PAD Antiplatelets:104664}  3.  Hypertension: Recommend patient work with PCP to meet goal BP <140/90  4.  Diabetes: A1C goal <7%. Consider referral to endocrinology and diabetes education to meet goals    Lifestyle modification:  1.  Diet: Recommend heart healthy mediterranean type diet  2. Smoking cessation: Discussed the importance of smoking cessation.  Smoking cessation program was offered to patient at today's office visit.  3. Exercise: Recommend minimum 150 minutes of brisk physical activity weekly.      HISTORY OF PRESENT ILLNESS     Calvin Obrien is a 72 y.o. 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 Patient was seen in consultation at the request of Dawna Part, *. Patient describes pain as ***, localized to ***. Patient can walk *** before having to stop due to pain, and pain resolves after *** minutes of rest. He denies rest pain or wounds on lower extremities that do not heal. Current smoker (0.2 ppd).    Interventions to date:  None vascular     ROS: The balance of 10 systems reviewed is negative except as noted in the HPI     RX/ ALLERGIES/ MED HX/SURG HX/ SOC HX/FAM HX: Reviewed & updated in Epic    Current Outpatient Medications   Medication Instructions    alendronate (FOSAMAX) 70 MG tablet TAKE ONE TABLET BY MOUTH WEEKLY    DULERA 200-5 mcg/actuation HFAA 20 mcg, 2 times a day (standard)    ezetimibe (ZETIA) 10 mg, Oral, Daily (standard), Frequency:QD   Dosage:10   MG  Instructions:  Note:Dose: 10MG     gabapentin (NEURONTIN) 600 mg, Oral, 2 times a day (standard)    hydroCHLOROthiazide (HYDRODIURIL) 25 mg, Oral, Daily (standard)    lisinopril (PRINIVIL,ZESTRIL) 40 mg, Oral, Daily (standard)    MYRBETRIQ 25 mg, Oral, Daily (standard)    omeprazole (PRILOSEC) 20 mg, Oral, Daily (standard)    oxybutynin (DITROPAN) 5 MG tablet Oral, 5 mg qAM and 15 mg qPM    oxyCODONE (ROXICODONE) 5 mg, Daily    sildenafil (VIAGRA) 100 mg, Oral, Nightly PRN    tadalafil 20 mg, Oral, Daily    tamsulosin (FLOMAX) 0.4 mg, Oral, Daily (standard)  traMADol (ULTRAM) 50 mg, Oral, Every 6 hours PRN    XTANDI 80 mg, Oral, Daily (standard), Swallow tablets whole. Do not chew, crush or split tablets.        PHYSICAL EXAM     There were no vitals filed for this visit.  General: well appearing, no acute distress   Head: normocephalic, atraumatic  Neck: Supple, carotid bruit(s)  {DESC; PRESENT/ABSENT:17923} {Right/left:16020}  Cardiac: RRR   Lungs: Easy work of breathing, CTA Bilaterally  Abdomen: Soft, non-distended, non tender, without mass  MSK: negative joint tenderness, normal gait and ROM  Skin: intact, good turgor, ***sparse hair growth pattern, onychomycosis   Neuro: AAO x 3, appropriate to questions  Extremities: Upper extremities warm and perfused. Bilateral lower extremities negative for edema, ulceration or digital gangrene.   Pulses: Bilateral radial pulses palpable. ***DP/PT signals    DIAGNOSTICS     Images and reports reviewed independently by Dr. Karie Mainland.     BLE Arterial Duplex (01/30/2023):     Labs:  Lab Results   Component Value Date    CREATININE 1.09 06/30/2021    A1C 5.5 03/08/2017

## 2023-01-30 ENCOUNTER — Ambulatory Visit: Admit: 2023-01-30 | Payer: MEDICARE | Attending: Vascular Surgery | Primary: Vascular Surgery

## 2023-01-30 ENCOUNTER — Ambulatory Visit: Admit: 2023-01-30 | Payer: MEDICARE

## 2023-01-31 NOTE — Unmapped (Signed)
[facility] Clinical Assessment & Refill Coordination Note    Calvin Obrien, DOB: Jan 29, 1951  Phone: [phone] (home)     All above HIPAA information was verified with patient.     Was a Nurse, learning disability used for this call? No    Specialty Medication(s):   Hematology/Oncology: Calvin Obrien     Current Outpatient Medications   Medication Sig Dispense Refill    alendronate (FOSAMAX) 70 MG tablet TAKE ONE TABLET BY MOUTH WEEKLY 4 tablet 11    DULERA 200-5 mcg/actuation HFAA 20 mcg Two (2) times a day.      enzalutamide (XTANDI) 40 mg tablet Take 2 tablets (80 mg total) by mouth daily. Swallow tablets whole. Do not chew, crush or split tablets. 60 tablet 5    ezetimibe (ZETIA) 10 mg tablet Take 1 tablet (10 mg total) by mouth daily. Frequency:QD   Dosage:10   MG  Instructions:  Note:Dose: 10MG       gabapentin (NEURONTIN) 600 MG tablet Take 1 tablet (600 mg total) by mouth two (2) times a day.      hydroCHLOROthiazide (HYDRODIURIL) 25 MG tablet Take 1 tablet (25 mg total) by mouth daily.      lisinopril (PRINIVIL,ZESTRIL) 40 MG tablet Take 1 tablet (40 mg total) by mouth daily.      MYRBETRIQ 25 mg Tb24 extended-release tablet TAKE ONE TABLET BY MOUTH ONCE DAILY 30 tablet 6    omeprazole (PRILOSEC) 20 MG capsule Take 1 capsule (20 mg total) by mouth daily.      oxybutynin (DITROPAN) 5 MG tablet Take by mouth. 5 mg qAM and 15 mg qPM  0    oxyCODONE (ROXICODONE) 5 MG immediate release tablet 1 tablet (5 mg total)  in the morning.      sildenafiL (VIAGRA) 100 MG tablet Take 1 tablet (100 mg total) by mouth nightly as needed for erectile dysfunction. 90 tablet 3    tadalafil 20 MG tablet Take 1 tablet (20 mg total) by mouth in the morning. 90 tablet 3    tamsulosin (FLOMAX) 0.4 mg capsule Take 1 capsule (0.4 mg total) by mouth daily. 30 capsule 11    traMADol (ULTRAM) 50 mg tablet Take 1 tablet (50 mg total) by mouth every six (6) hours as needed for pain.       No current facility-administered medications for this visit.        Changes to medications: Calvin Obrien reports no changes at this time.    Allergies   Allergen Reactions    Poison Ivy Extract      Poison Ivy- Rash per patient       Changes to allergies: No    SPECIALTY MEDICATION ADHERENCE     Xtandi 40 mg: 0 days of medicine on hand       Medication Adherence    Patient reported X missed doses in the last month: 7  Specialty Medication: Xtandi 40mg   Patient is on additional specialty medications: No  Informant: patient          Specialty medication(s) dose(s) confirmed: Regimen is correct and unchanged.     Are there any concerns with adherence? Yes: Patient moved and lost his bottle of medicine.    Adherence counseling provided? Yes: reminded patient to call us if he realizes he has no medicine to take so we can try to refill his prescription sooner.    CLINICAL MANAGEMENT AND INTERVENTION      Clinical Benefit Assessment:    Do  you feel the medicine is effective or helping your condition? Yes    Clinical Benefit counseling provided? Not needed    Adverse Effects Assessment:    Are you experiencing any side effects? No    Are you experiencing difficulty administering your medicine? No    Quality of Life Assessment:    Quality of Life    Rheumatology  Oncology  1. What impact has your specialty medication had on the reduction of your daily pain or discomfort level?: None  2. On a scale of 1-10, how would you rate your ability to manage side effects associated with your specialty medication? (1=no issues, 10 = unable to take medication due to side effects): 1  Dermatology  Cystic Fibrosis               Have you discussed this with your provider? Not needed    Acute Infection Status:    Acute infections noted within Epic:  No active infections  Patient reported infection: None    Therapy Appropriateness:    Is therapy appropriate based on current medication list, adverse reactions, adherence, clinical benefit and progress toward achieving therapeutic goals? Yes, therapy is appropriate and should be continued     DISEASE/MEDICATION-SPECIFIC INFORMATION      N/A    Oncology: Is the patient receiving adequate infection prevention treatment? Not applicable  Does the patient have adequate nutritional support? Not applicable    PATIENT SPECIFIC NEEDS     Does the patient have any physical, cognitive, or cultural barriers? No    Is the patient high risk? Yes, patient is taking oral chemotherapy. Appropriateness of therapy as been assessed    Did the patient require a clinical intervention? No    Does the patient require physician intervention or other additional services (i.e., nutrition, smoking cessation, social work)? No    SOCIAL DETERMINANTS OF HEALTH     At the [facility], we have learned that life circumstances - like trouble affording food, housing, utilities, or transportation can affect the health of many of our patients.   That is why we wanted to ask: are you currently experiencing any life circumstances that are negatively impacting your health and/or quality of life? Patient declined to answer    Social Determinants of Health     Food Insecurity: Not on file   Internet Connectivity: Not on file   Housing/Utilities: Not on file   Tobacco Use: High Risk (01/05/2023)    Received from [facility]    Patient History     Smoking Tobacco Use: Every Day     Smokeless Tobacco Use: Never     Passive Exposure: Not on file   Transportation Needs: Not on file   Alcohol Use: Not on file   Interpersonal Safety: Unknown (01/31/2023)    Interpersonal Safety     Unsafe Where You Currently Live: Not on file     Physically Hurt by Anyone: Not on file     Abused by Anyone: Not on file   Physical Activity: Not on file   Intimate Partner Violence: Not on file   Stress: Not on file   Substance Use: Not on file   Social Connections: Not on file   Financial Resource Strain: Not on file   Depression: Not on file   Health Literacy: Not on file       Would you be willing to receive help with any of the needs that you have identified today? Not applicable  SHIPPING     Specialty Medication(s) to be Shipped:   Hematology/Oncology: Calvin Obrien    Other medication(s) to be shipped: No additional medications requested for fill at this time     Changes to insurance: No    Delivery Scheduled: Yes, Expected medication delivery date: 11/1.     Medication will be delivered via UPS to the confirmed prescription address in [facility].    The patient will receive a drug information handout for each medication shipped and additional FDA Medication Guides as required.  Verified that patient has previously received a Conservation officer, historic buildings and a Surveyor, mining.    The patient or caregiver noted above participated in the development of this care plan and knows that they can request review of or adjustments to the care plan at any time.      All of the patient's questions and concerns have been addressed.    Clydell Hakim, PharmD   [facility] Specialty Pharmacist

## 2023-02-01 DIAGNOSIS — R6889 Other general symptoms and signs: Principal | ICD-10-CM

## 2023-02-01 DIAGNOSIS — M79671 Pain in right foot: Principal | ICD-10-CM

## 2023-02-01 DIAGNOSIS — M79672 Pain in left foot: Principal | ICD-10-CM

## 2023-02-01 MED FILL — XTANDI 40 MG TABLET: ORAL | 30 days supply | Qty: 60 | Fill #5

## 2023-02-12 ENCOUNTER — Other Ambulatory Visit: Payer: Self-pay | Admitting: Surgical

## 2023-02-12 ENCOUNTER — Other Ambulatory Visit: Payer: Self-pay

## 2023-02-12 DIAGNOSIS — D1722 Benign lipomatous neoplasm of skin and subcutaneous tissue of left arm: Secondary | ICD-10-CM

## 2023-02-12 MED ORDER — HYDROCODONE-ACETAMINOPHEN 5-325 MG PO TABS: 1.0000 | ORAL_TABLET | Freq: Four times a day (QID) | ORAL | 0 refills | Status: AC | PRN

## 2023-02-13 LAB — SURGICAL PATHOLOGY

## 2023-02-22 DIAGNOSIS — C61 Malignant neoplasm of prostate: Principal | ICD-10-CM

## 2023-02-22 DIAGNOSIS — C7951 Secondary malignant neoplasm of bone: Principal | ICD-10-CM

## 2023-02-22 MED ORDER — XTANDI 40 MG TABLET
ORAL_TABLET | Freq: Every day | ORAL | 5 refills | 30 days
Start: 2023-02-22 — End: ?

## 2023-02-24 ENCOUNTER — Emergency Department (HOSPITAL_COMMUNITY)
Admission: EM | Admit: 2023-02-24 | Discharge: 2023-02-24 | Disposition: A | Discharge status: Home / Self Care | Payer: 59 | Attending: Emergency Medicine | Admitting: Emergency Medicine

## 2023-02-24 ENCOUNTER — Other Ambulatory Visit: Payer: Self-pay

## 2023-02-24 ENCOUNTER — Encounter (HOSPITAL_COMMUNITY): Payer: Self-pay

## 2023-02-24 DIAGNOSIS — G629 Polyneuropathy, unspecified: Secondary | ICD-10-CM

## 2023-02-24 DIAGNOSIS — G609 Hereditary and idiopathic neuropathy, unspecified: Secondary | ICD-10-CM | POA: Diagnosis not present

## 2023-02-24 DIAGNOSIS — Z79899 Other long term (current) drug therapy: Secondary | ICD-10-CM | POA: Diagnosis not present

## 2023-02-24 DIAGNOSIS — M79671 Pain in right foot: Secondary | ICD-10-CM | POA: Insufficient documentation

## 2023-02-24 DIAGNOSIS — Z8546 Personal history of malignant neoplasm of prostate: Secondary | ICD-10-CM | POA: Diagnosis not present

## 2023-02-24 DIAGNOSIS — I10 Essential (primary) hypertension: Secondary | ICD-10-CM | POA: Insufficient documentation

## 2023-02-24 MED ORDER — HYDROCODONE-ACETAMINOPHEN 5-325 MG PO TABS
1.0000 | ORAL_TABLET | Freq: Once | ORAL | Status: AC
  Administered 2023-02-24: 1 via ORAL
  Filled 2023-02-24: qty 1

## 2023-02-24 NOTE — ED Provider Notes (Signed)
[place] EMERGENCY DEPARTMENT AT [facility] Provider Note   CSN: [id number] Arrival date & time: 02/24/23  1308     History  Chief Complaint  Patient presents with   Foot Pain    Willie Fowler is a 72 y.o. male with past medical history of chronic lower back pain, metastatic prostate cancer, tobacco abuse, hypertension, HLD, chronic neuropathy presents emerged department for evaluation of decreased sensation of the plantar aspect of his foot and anterior portion of his big toe.  He reports that this has been hurting intermittently for the past few months but started hurting worse today describing as a 7/10 "tingling pain".  He denies fevers, known injury, swelling, weakness.   Foot Pain Pertinent negatives include no chest pain, no abdominal pain, no headaches and no shortness of breath.      Home Medications Prior to Admission medications   Medication Sig Start Date End Date Taking? Authorizing Provider  enzalutamide (XTANDI) 40 MG capsule Take 160 mg by mouth daily.    [provider]  ezetimibe (ZETIA) 10 MG tablet Take 10 mg by mouth daily.  08/18/11   [provider]  gabapentin (NEURONTIN) 600 MG tablet Take 600 mg by mouth 3 (three) times daily.  12/19/16   [provider]  hydrochlorothiazide (HYDRODIURIL) 25 MG tablet Take 25 mg by mouth daily.    [provider]  HYDROcodone-acetaminophen (NORCO/VICODIN) 5-325 MG tablet Take 1 tablet by mouth every 6 (six) hours as needed for moderate pain (pain score 4-6). 02/12/23 02/12/24  Magnant, Charles L, PA-C  lisinopril (PRINIVIL,ZESTRIL) 40 MG tablet Take 40 mg by mouth daily. 12/25/14   [provider]  omeprazole (PRILOSEC) 20 MG capsule Take 20 mg by mouth daily.    [provider]  oxybutynin (DITROPAN) 5 MG tablet Take 5-15 mg by mouth See admin instructions. Taking 1 tablet (5mg ) in the morning and 3 tablets (15mg ) at bedtime.    [provider]   pregabalin (LYRICA) 150 MG capsule Take 150 mg by mouth 2 (two) times daily.     [provider]  tamsulosin (FLOMAX) 0.4 MG CAPS capsule Take 0.4 mg by mouth daily. 04/19/12   [provider]  VENTOLIN HFA 108 (90 BASE) MCG/ACT inhaler Inhale 2 puffs into the lungs every 4 (four) hours as needed for wheezing.  03/16/15   [provider]      Allergies    Patient has no known allergies.    Review of Systems   Review of Systems  Constitutional:  Negative for chills, fatigue and fever.  Respiratory:  Negative for cough, chest tightness, shortness of breath and wheezing.   Cardiovascular:  Negative for chest pain and palpitations.  Gastrointestinal:  Negative for abdominal pain, constipation, diarrhea, nausea and vomiting.  Musculoskeletal:  Positive for arthralgias.  Neurological:  Negative for dizziness, seizures, weakness, light-headedness, numbness and headaches.    Physical Exam Updated Vital Signs BP (!) 119/56   Pulse (!) 58   Temp 98.4 F (36.9 C) (Oral)   Resp 16   Ht 5\' 7"  (1.702 m)   Wt 79.8 kg   SpO2 92%   BMI 27.57 kg/m  Physical Exam Vitals and nursing note reviewed.  Constitutional:      General: He is not in acute distress.    Appearance: Normal appearance.  HENT:     Head: Normocephalic and atraumatic.  Eyes:     Conjunctiva/sclera: Conjunctivae normal.  Cardiovascular:     Rate  and Rhythm: Normal rate.  Pulmonary:     Effort: Pulmonary effort is normal. No respiratory distress.  Musculoskeletal:     Comments: No swelling of knees, calves, ankles, MCPs, PIPs, DIPs of BLE  Skin:    Capillary Refill: Capillary refill takes less than 2 seconds.     Coloration: Skin is not jaundiced or pale.     Comments: No warmth, erythema of knees, calves, MTPs, PIPs, DIPs of BLE  Neurological:     Mental Status: He is alert and oriented to person, place, and time. Mental status is at baseline.     Comments: Mildly decreased sensation at L4  and L5 dermatome from right ankle distally Sensation intact and equal BLE otherwise 5/5 ankle dorsi and plantar flexion     ED Results / Procedures / Treatments   Labs (all labs ordered are listed, but only abnormal results are displayed) Labs Reviewed - No data to display  EKG None  Radiology No results found.  Procedures Procedures    Medications Ordered in ED Medications  HYDROcodone-acetaminophen (NORCO/VICODIN) 5-325 MG per tablet 1 tablet (1 tablet Oral Given 02/24/23 1213)    ED Course/ Medical Decision Making/ A&P                                 Medical Decision Making Risk Prescription drug management.   Patient presents to the ED for concern of decreased sensation of the left foot over the past few months, this involves an extensive number of treatment options, and is a complaint that carries with it a high risk of complications and morbidity.  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   Medicines ordered and prescription drug management:  I ordered medication including Norco  for pain  Reevaluation of the patient after these medicines showed that the patient improved I have reviewed the patients home medicines and have made adjustments as needed   Test Considered:  Imaging, labs however this is a chronic issue that is being followed up by neurology    Problem List / ED  Course:  Neuropathy   Reevaluation:  After the interventions noted above, I reevaluated the patient and found that they have :improved   Social Determinants of Health:  Has PCP, neurology F/U   Dispostion:  Patient is resting comfortably in bed.  Vital signs within normal limits.  See HPI.  Physical exam is reassuring with strong pedal pulses and motor equal/intact.  I have low suspicion for ischemic limb or emergent causes of neuropathy.  He denies recent injury so do not feel that imaging is required.  Will provide pain management in ED as he is not taking his medication today.  After consideration of the diagnostic results and the patients response to treatment, I feel that the patent would benefit from outpatient management and neurology follow-up as needed.  Discussed findings, disposition, return to emerged department precautions with patient expresses understanding agrees with plan.  All questions answered to his satisfaction.  Case discussed with Dr. Particia Nearing who agrees with plan       Final Clinical Impression(s) / ED Diagnoses Final diagnoses:  Neuropathy  Foot pain, right    Rx / DC Orders ED Discharge Orders     None         Judithann Sheen, PA 02/25/23 1142    Jacalyn Lefevre, MD 02/27/23 [phone]

## 2023-02-24 NOTE — ED Notes (Signed)
Changed dressing on LEFT shoulder surgical incision site Pt had surgery 02/12/23 Never changed dressing, followed up with surgeon, or cleaned site Incision intact Wife to call surgeons office

## 2023-02-24 NOTE — ED Notes (Signed)
Pt attached to partial monitor

## 2023-02-24 NOTE — ED Triage Notes (Signed)
Pt complains of RIGHT foot pain and feeling numb  No swelling noted Strong pedal pulse Pt complains of decreased sensation to touch  Stated it has been ongoing for months Denies injury

## 2023-02-24 NOTE — Discharge Instructions (Addendum)
Thank you for letting us evaluate you today.  We have given you Norco which is a narcotic medication in the emergency department for your pain and numbness/tingling.  Please follow-up with your primary care provider for further management of this and continue to take your medications as prescribed.  You may take your Norco at home as prescribed (every 6 hours as needed for severe pain)  Please return to the emergency department if you are unable to walk, have loss of sensation of 1 side of your body, slurred speech or pale, tender calves

## 2023-03-01 NOTE — Unmapped (Signed)
[facility] Refill Coordination Note    Specialty Medication(s) to be Shipped:   Hematology/Oncology: Calvin Obrien    Other medication(s) to be shipped: No additional medications requested for fill at this time     Calvin Obrien, DOB: [birth year]  Phone: [phone] (home)       All above HIPAA information was verified with patient.     Was a Nurse, learning disability used for this call? No    Completed refill call assessment today to schedule patient's medication shipment from the [facility]  [phone]).  All relevant notes have been reviewed.     Specialty medication(s) and dose(s) confirmed: Regimen is correct and unchanged.   Changes to medications: Tyrique reports no changes at this time.  Changes to insurance: No  New side effects reported not previously addressed with a pharmacist or physician: None reported  Questions for the pharmacist: No    Confirmed patient received a Conservation officer, historic buildings and a Surveyor, mining with first shipment. The patient will receive a drug information handout for each medication shipped and additional FDA Medication Guides as required.       DISEASE/MEDICATION-SPECIFIC INFORMATION        N/A    SPECIALTY MEDICATION ADHERENCE     Medication Adherence    Patient reported X missed doses in the last month: 0  Specialty Medication: Xtandi 40 mg  Patient is on additional specialty medications: No  Patient is on more than two specialty medications: No  Any gaps in refill history greater than 2 weeks in the last 3 months: no  Demonstrates understanding of importance of adherence: yes  Informant: patient              Were doses missed due to medication being on hold? No    XTANDI 40 mg: 7 days of medicine on hand       REFERRAL TO PHARMACIST     Referral to the pharmacist: Not needed      [facility]     Shipping address confirmed in Epic.       Delivery Scheduled: Yes, Expected medication delivery date: 03/07/23.  However, Rx request for refills was sent to the provider as there are none remaining.     Medication will be delivered via UPS to the prescription address in Epic WAM.    Calvin Obrien   [facility]  Specialty Technician

## 2023-03-08 MED ORDER — ENZALUTAMIDE 40 MG TABLET
ORAL_TABLET | Freq: Every day | ORAL | 5 refills | 30 days | Status: CP
Start: 2023-03-08 — End: ?
  Filled 2023-03-09: qty 60, 30d supply, fill #0

## 2023-03-09 NOTE — Unmapped (Signed)
Calvin Obrien 's XTANDI 40 mg tablet (enzalutamide) shipment will be sent out as a result of a new prescription for the medication has been received.      I have spoken with the patient at [phone] and communicated the delivery change. We will reschedule the medication for the delivery date that the patient agreed upon.  We have confirmed the delivery date as 03/10/23 via UPS

## 2023-03-12 ENCOUNTER — Other Ambulatory Visit: Admit: 2023-03-12 | Discharge: 2023-03-13 | Payer: MEDICARE

## 2023-03-12 DIAGNOSIS — C61 Malignant neoplasm of prostate: Principal | ICD-10-CM

## 2023-03-12 DIAGNOSIS — C7951 Secondary malignant neoplasm of bone: Principal | ICD-10-CM

## 2023-03-12 LAB — TESTOSTERONE: TESTOSTERONE TOTAL: 18 ng/dL — ABNORMAL LOW (ref 188–684)

## 2023-03-12 LAB — PSA: PROSTATE SPECIFIC ANTIGEN: 0.05 ng/mL (ref 0.00–4.00)

## 2023-03-15 ENCOUNTER — Institutional Professional Consult (permissible substitution): Admit: 2023-03-15 | Discharge: 2023-03-15 | Payer: MEDICARE

## 2023-03-15 ENCOUNTER — Ambulatory Visit: Admit: 2023-03-15 | Discharge: 2023-03-15 | Payer: MEDICARE | Attending: Medical Oncology | Primary: Medical Oncology

## 2023-03-15 DIAGNOSIS — C7951 Secondary malignant neoplasm of bone: Principal | ICD-10-CM

## 2023-03-15 DIAGNOSIS — C61 Malignant neoplasm of prostate: Principal | ICD-10-CM

## 2023-03-15 DIAGNOSIS — F1121 Opioid dependence, in remission: Principal | ICD-10-CM

## 2023-03-15 MED ADMIN — leuprolide (LUPRON) injection 22.5 mg: 22.5 mg | INTRAMUSCULAR | @ 16:00:00 | Stop: 2023-03-15

## 2023-03-15 NOTE — Unmapped (Signed)
MEDICAL ONCOLOGY FOLLOW UP VISIT      Impression:     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  After adding enzalutamide, PSA dropped to non-detectable.    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  He is followed in a pain clinic and has seen Palliative Care.    He is feeling very well with good energy.    Based on prior PSA values and how he is feeling, we will continue current therapy with Lupron and enzalutamide.    Lab Results   Component Value Date    PSA 0.05 03/12/2023    PSA <0.04 10/19/2022    PSA <0.04 05/25/2022    PSA <0.04 02/02/2022    PSA <0.04 10/13/2021    PSA <0.04 06/30/2021       Plan:   - Follow up PSA.  - Continue 41-month Lupron 22.5mg  (02/02/22).  (I tried to change to 50-month injection but insurance refused this apparently.)  - Pain - has been managed by palliative care.  - Dose reduced enzalutamide to see if improves his skin reaction (dry irritated skin on face. Legs) - two 40mg  tablets in the morning only now (previously switched to 40 mg tablets because he was mechanically unable to swallow the larger tablets).  If skin issues persist and pending Dermatology assessment, could consider trying to change ARSI drugs.  - Face/leg rash, possibly related to enzalutamide?  Referred to Dermatology, still has not seen them, we will look into it.  - He was previously referred to [facility] for ED assessment (and can also discuss urinary leakage).  - Follow up PSA.  - He will follow up with his pain clinic and with palliative Care here as warranted.  - He will meet about gas money today to help him out.  - Urinary leakage: He has seen Barkley Bruns, NP for evaluation. I recommended pads.  - BMD scan was done 11/25/20 with low bone density - started Fosamax.  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  - For ED, has seen Dr. Colon Branch in the past.  Viagra does not help.  He has spoken with Urology about options.    - Spine: He had prior decompression, now doing back exercises with substantial improvement of symptoms.  - Hot flashes - on Gabapentin 300 TID, Katina Degree PhD has counseled, pharmacy seeing today again. Stable  -   Code Status: Prior     I spent 41 minutes on records review, meeting with the patient, documentation and coordination on the day of service.    RTC in 3 months for next Lupron, and to check health status.      -------------------------------    Other Physicians:   Dr. Maryruth Eve [facility])  Dr. Carnella [facility])  Dr. Nolon Rod (referring; Radiation Oncology)   Dr. Baxter Flattery (Urology)   Dr. Nuala Alpha (Anesthesia pain)   Dr. Hoy Morn (Urology, RE: ED)   Dr. 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  Current therapy: Lupron    Oncology history:   - Diagnosed 2008 with prostate cancer, PSA 13.5.   - Prostate biopsy on 04/02/07 w Gleason 3+4=7   - 2009 Radiation therapy to w Dr. Nonie Hoyer 78 Gy.   - 2012-13: Rising PSA   - 11/13: Saw [place] rad onc, felt not candidate for salvage RT   - 2/14: BS c/w 2009 with suspicious increased uptake at T5, L inferior iliac bone, subtle at L1.   - CT A/P shows sclerosis at L4-5 equivocal for DJD vs metastases, I discussed w radiology, felt most likely metastatic.   - 3/14: Lupron started   - 12/14: Lupron held for intermittent approach  - 11/13/13: Restarted Lupron due to PSA rise with worsening back pain  - 8/15: Palliative RT to spine in [place], [place].  - 05/25/14:  MRI spine: Multilevel degenerative changes, most prominent at L3-4 and L4-L5, where there is marked spinal canal stenosis and crowding with potential indentation of the nerve roots. Severe bilateral neural foraminal narrowing at L4-5 and moderate to marked neuroforaminal narrowing at L3-4 on the right. Additional multilevel degenerative changes as outlined above.    - 08/15/15: Abnormal focal marrow signal and enhancement at the endplates surrounding the L4-5 disc space are favored to represent acutely inflamed Schmorl's nodes. Metastatic disease is felt less likely but is not entirely excluded, particularly given enlargement of the lesion at the L5 level. Continued follow-up recommended.  - 6/17: Bone mineral density: The femoral neck density is mildly low, but the spine and total femoral densities are normal.  - 10/17: L2-S1 decompression surgery w Dr. 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  - 12/17: L ankle pain, negative xray.  - 04/20/18: CT A/P: Stable heterogeneity of the left iliac wing representing known metastatic disease. No new evidence of metastatic disease within the abdomen/pelvis.  - 04/25/18: Bone scan: Increased radiotracer activity within the left iliac bone concerning for osseous metastases.  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  Ongoing back pain w L sciatica.  Hot flashes persist on Lupron, daily.  Nocturia/chronic dysuria: much better with Ditropan 0-1x/night.  No pain.  Persistent ED.  Remainder of 10 system ROS otherwise negative.    Physical exam:   BP 105/51  - Pulse 80  - Temp 36.6 ??C (97.8 ??F) (Temporal)  - Resp 18  - Ht 167.6 cm (5' 6)  - Wt 81.6 kg (180 lb)  - SpO2 97%  - BMI 29.05 kg/m??   NAD  A+Ox3  No visible rash  No dyspnea or cough  No apparent neurological deficit    Current Outpatient Medications   Medication Sig Dispense Refill HYDROcodone-acetaminophen (NORCO) 5-325 mg per tablet Take 1 tablet by mouth.      alendronate (FOSAMAX) 70 MG tablet TAKE ONE TABLET BY MOUTH WEEKLY 4 tablet 11    DULERA 200-5 mcg/actuation HFAA 20 mcg Two (2) times a day.      enzalutamide (XTANDI) 40 mg tablet Take 2 tablets (80 mg total) by mouth daily. Swallow tablets whole. Do not chew, crush or split tablets. 60 tablet 5    ezetimibe (ZETIA) 10 mg tablet Take 1 tablet (10 mg total) by mouth daily. Frequency:QD   Dosage:10   MG  Instructions:  Note:Dose: 10MG       gabapentin (NEURONTIN) 600 MG tablet Take  1 tablet (600 mg total) by mouth two (2) times a day.      hydroCHLOROthiazide (HYDRODIURIL) 25 MG tablet Take 1 tablet (25 mg total) by mouth daily.      lisinopril (PRINIVIL,ZESTRIL) 40 MG tablet Take 1 tablet (40 mg total) by mouth daily.      MYRBETRIQ 25 mg Tb24 extended-release tablet TAKE ONE TABLET BY MOUTH ONCE DAILY 30 tablet 6    omeprazole (PRILOSEC) 20 MG capsule Take 1 capsule (20 mg total) by mouth daily.      oxybutynin (DITROPAN) 5 MG tablet Take by mouth. 5 mg qAM and 15 mg qPM  0    oxyCODONE (ROXICODONE) 5 MG immediate release tablet 1 tablet (5 mg total)  in the morning.      sildenafiL (VIAGRA) 100 MG tablet Take 1 tablet (100 mg total) by mouth nightly as needed for erectile dysfunction. 90 tablet 3    tadalafil 20 MG tablet Take 1 tablet (20 mg total) by mouth in the morning. 90 tablet 3    tamsulosin (FLOMAX) 0.4 mg capsule Take 1 capsule (0.4 mg total) by mouth daily. 30 capsule 11    traMADol (ULTRAM) 50 mg tablet Take 1 tablet (50 mg total) by mouth every six (6) hours as needed for pain.       No current facility-administered medications for this visit.       Allergies   Allergen Reactions    Poison Ivy Extract      Poison Ivy- Rash per patient       Laboratory:   06/27/12: PSA=3 (on Casodex), testosterone >800     Lab Results   Component Value Date    PSA Diagnostic 0.6 05/14/2014    PSA Diagnostic 0.5 02/05/2014    PSA Diagnostic 6.9 (H) 11/06/2013    PSA Diagnostic 2.4 07/24/2013    PSA Diagnostic 0.3 03/20/2013    PSA Diagnostic 0.2 12/26/2012       Lab on 03/12/2023   Component Date Value Ref Range Status    PSA 03/12/2023 0.05  0.00 - 4.00 ng/mL Final    Testosterone 03/12/2023 18 (L)  188 - 684 ng/dL Final     Lab Results   Component Value Date    PSA 0.05 03/12/2023    PSA <0.04 10/19/2022    PSA <0.04 05/25/2022    PSA <0.04 02/02/2022    PSA <0.04 10/13/2021    PSA <0.04 06/30/2021     3/17: Thyroid function WNL.

## 2023-03-19 ENCOUNTER — Encounter (HOSPITAL_COMMUNITY): Payer: Self-pay

## 2023-03-19 ENCOUNTER — Emergency Department (HOSPITAL_COMMUNITY): Payer: 59

## 2023-03-19 ENCOUNTER — Emergency Department (HOSPITAL_COMMUNITY)
Admission: EM | Admit: 2023-03-19 | Discharge: 2023-03-19 | Disposition: A | Discharge status: Home / Self Care | Payer: 59 | Attending: Emergency Medicine | Admitting: Emergency Medicine

## 2023-03-19 DIAGNOSIS — Z20822 Contact with and (suspected) exposure to covid-19: Secondary | ICD-10-CM | POA: Insufficient documentation

## 2023-03-19 DIAGNOSIS — R051 Acute cough: Secondary | ICD-10-CM | POA: Diagnosis not present

## 2023-03-19 DIAGNOSIS — Z8546 Personal history of malignant neoplasm of prostate: Secondary | ICD-10-CM | POA: Diagnosis not present

## 2023-03-19 DIAGNOSIS — I10 Essential (primary) hypertension: Secondary | ICD-10-CM | POA: Diagnosis not present

## 2023-03-19 DIAGNOSIS — R059 Cough, unspecified: Secondary | ICD-10-CM | POA: Diagnosis present

## 2023-03-19 DIAGNOSIS — Z720 Tobacco use: Secondary | ICD-10-CM | POA: Insufficient documentation

## 2023-03-19 DIAGNOSIS — Z79899 Other long term (current) drug therapy: Secondary | ICD-10-CM | POA: Diagnosis not present

## 2023-03-19 LAB — CBC WITH DIFFERENTIAL/PLATELET
Abs Immature Granulocytes: 0.04 10*3/uL (ref 0.00–0.07)
Basophils Absolute: 0 10*3/uL (ref 0.0–0.1)
Basophils Relative: 0 %
Eosinophils Absolute: 0.2 10*3/uL (ref 0.0–0.5)
Eosinophils Relative: 2 %
HCT: 36.1 % — ABNORMAL LOW (ref 39.0–52.0)
Hemoglobin: 12.3 g/dL — ABNORMAL LOW (ref 13.0–17.0)
Immature Granulocytes: 1 %
Lymphocytes Relative: 18 %
Lymphs Abs: 1.5 10*3/uL (ref 0.7–4.0)
MCH: 31.5 pg (ref 26.0–34.0)
MCHC: 34.1 g/dL (ref 30.0–36.0)
MCV: 92.3 fL (ref 80.0–100.0)
Monocytes Absolute: 0.7 10*3/uL (ref 0.1–1.0)
Monocytes Relative: 9 %
Neutro Abs: 6.1 10*3/uL (ref 1.7–7.7)
Neutrophils Relative %: 70 %
Platelets: 245 10*3/uL (ref 150–400)
RBC: 3.91 MIL/uL — ABNORMAL LOW (ref 4.22–5.81)
RDW: 12.9 % (ref 11.5–15.5)
WBC: 8.5 10*3/uL (ref 4.0–10.5)
nRBC: 0 % (ref 0.0–0.2)

## 2023-03-19 LAB — COMPREHENSIVE METABOLIC PANEL
ALT: 10 U/L (ref 0–44)
AST: 17 U/L (ref 15–41)
Albumin: 3.7 g/dL (ref 3.5–5.0)
Alkaline Phosphatase: 60 U/L (ref 38–126)
Anion gap: 11 (ref 5–15)
BUN: 15 mg/dL (ref 8–23)
CO2: 23 mmol/L (ref 22–32)
Calcium: 8.8 mg/dL — ABNORMAL LOW (ref 8.9–10.3)
Chloride: 102 mmol/L (ref 98–111)
Creatinine, Ser: 1.49 mg/dL — ABNORMAL HIGH (ref 0.61–1.24)
GFR, Estimated: 49 mL/min — ABNORMAL LOW (ref >60)
Glucose, Bld: 99 mg/dL (ref 70–99)
Potassium: 3.9 mmol/L (ref 3.5–5.1)
Sodium: 136 mmol/L (ref 135–145)
Total Bilirubin: 0.6 mg/dL (ref <1.2)
Total Protein: 7.3 g/dL (ref 6.5–8.1)

## 2023-03-19 LAB — RESP PANEL BY RT-PCR (RSV, FLU A&B, COVID)  RVPGX2
Influenza A by PCR: NEGATIVE
Influenza B by PCR: NEGATIVE
Resp Syncytial Virus by PCR: NEGATIVE
SARS Coronavirus 2 by RT PCR: NEGATIVE

## 2023-03-19 LAB — MAGNESIUM: Magnesium: 2.3 mg/dL (ref 1.7–2.4)

## 2023-03-19 MED ORDER — ALBUTEROL SULFATE HFA 108 (90 BASE) MCG/ACT IN AERS
2.0000 | INHALATION_SPRAY | Freq: Once | RESPIRATORY_TRACT | Status: AC
  Administered 2023-03-19: 2 via RESPIRATORY_TRACT
  Filled 2023-03-19: qty 6.7

## 2023-03-19 MED ORDER — AZITHROMYCIN 250 MG PO TABS: 250.0000 mg | ORAL_TABLET | Freq: Every day | ORAL | 0 refills | Status: AC

## 2023-03-19 MED ORDER — IPRATROPIUM-ALBUTEROL 0.5-2.5 (3) MG/3ML IN SOLN
3.0000 mL | Freq: Once | RESPIRATORY_TRACT | Status: AC
  Administered 2023-03-19: 3 mL via RESPIRATORY_TRACT
  Filled 2023-03-19: qty 3

## 2023-03-19 NOTE — ED Triage Notes (Signed)
Pt coming from home has had a productive cough with yellow discharge for the past two days. Denies nausea, vomiting, or fever.

## 2023-03-19 NOTE — ED Provider Notes (Signed)
[place] EMERGENCY DEPARTMENT AT [facility] Provider Note   CSN: [id number] Arrival date & time: 03/19/23  1914     History  Chief Complaint  Patient presents with   Cough    CHARLE Fowler is a 72 y.o. male.   Cough Patient presents for cough.  Medical history includes HTN, anemia, arthritis, chronic pain, tobacco use, metastatic prostate cancer.  For the past 2 days, he has had cough productive of yellow sputum.  He denies any recent fevers or chills.  His breathing has felt normal to him.  He denies any areas of pain or discomfort.     Home Medications Prior to Admission medications   Medication Sig Start Date End Date Taking? Authorizing Provider  azithromycin (ZITHROMAX) 250 MG tablet Take 1 tablet (250 mg total) by mouth daily. Take first 2 tablets together, then 1 every day until finished. 03/19/23  Yes Gloris Manchester, MD  enzalutamide [name]) 40 MG capsule Take 160 mg by mouth daily.    [provider]  ezetimibe (ZETIA) 10 MG tablet Take 10 mg by mouth daily.  08/18/11   [provider]  gabapentin (NEURONTIN) 600 MG tablet Take 600 mg by mouth 3 (three) times daily.  12/19/16   [provider]  hydrochlorothiazide (HYDRODIURIL) 25 MG tablet Take 25 mg by mouth daily.    [provider]  HYDROcodone-acetaminophen (NORCO/VICODIN) 5-325 MG tablet Take 1 tablet by mouth every 6 (six) hours as needed for moderate pain (pain score 4-6). 02/12/23 02/12/24  Magnant, Charles L, PA-C  lisinopril (PRINIVIL,ZESTRIL) 40 MG tablet Take 40 mg by mouth daily. 12/25/14   [provider]  omeprazole (PRILOSEC) 20 MG capsule Take 20 mg by mouth daily.    [provider]  oxybutynin (DITROPAN) 5 MG tablet Take 5-15 mg by mouth See admin instructions. Taking 1 tablet (5mg ) in the morning and 3 tablets (15mg ) at bedtime.    [provider]  pregabalin (LYRICA) 150 MG capsule Take 150 mg by mouth 2 (two) times daily.      [provider]  tamsulosin (FLOMAX) 0.4 MG CAPS capsule Take 0.4 mg by mouth daily. 04/19/12   [provider]  VENTOLIN HFA 108 (90 BASE) MCG/ACT inhaler Inhale 2 puffs into the lungs every 4 (four) hours as needed for wheezing.  03/16/15   [provider]      Allergies    Patient has no known allergies.    Review of Systems   Review of Systems  Respiratory:  Positive for cough.   All other systems reviewed and are negative.   Physical Exam Updated Vital Signs BP (!) 140/64 (BP Location: Right Arm)   Pulse 81   Temp 98.7 F (37.1 C) (Oral)   Resp 17   Ht 5\' 7"  (1.702 m)   Wt 81.6 kg   SpO2 95%   BMI 28.19 kg/m  Physical Exam Vitals and nursing note reviewed.  Constitutional:      General: He is not in acute distress.    Appearance: Normal appearance. He is well-developed. He is not ill-appearing, toxic-appearing or diaphoretic.  HENT:     Head: Normocephalic and atraumatic.     Right Ear: External ear normal.     Left Ear: External ear normal.     Nose: Nose normal.     Mouth/Throat:     Mouth: Mucous membranes are moist.  Eyes:     Extraocular Movements: Extraocular movements intact.  Conjunctiva/sclera: Conjunctivae normal.  Cardiovascular:     Rate and Rhythm: Normal rate and regular rhythm.     Heart sounds: No murmur heard. Pulmonary:     Effort: Pulmonary effort is normal. No respiratory distress.     Breath sounds: Wheezing and rhonchi present. No rales.  Abdominal:     General: There is no distension.     Palpations: Abdomen is soft.     Tenderness: There is no abdominal tenderness.  Musculoskeletal:        General: No swelling.     Cervical back: Normal range of motion and neck supple.     Right lower leg: No edema.     Left lower leg: No edema.  Skin:    General: Skin is warm and dry.     Coloration: Skin is not jaundiced or pale.  Neurological:     General: No focal deficit present.     Mental Status: He is alert  and oriented to person, place, and time.  Psychiatric:        Mood and Affect: Mood normal.        Behavior: Behavior normal.     ED Results / Procedures / Treatments   Labs (all labs ordered are listed, but only abnormal results are displayed) Labs Reviewed  COMPREHENSIVE METABOLIC PANEL - Abnormal; Notable for the following components:      Result Value   Creatinine, Ser 1.49 (*)    Calcium 8.8 (*)    GFR, Estimated 49 (*)    All other components within normal limits  CBC WITH DIFFERENTIAL/PLATELET - Abnormal; Notable for the following components:   RBC 3.91 (*)    Hemoglobin 12.3 (*)    HCT 36.1 (*)    All other components within normal limits  RESP PANEL BY RT-PCR (RSV, FLU A&B, COVID)  RVPGX2  MAGNESIUM    EKG EKG Interpretation Date/Time:  Monday March 19 2023 09:59:56 EST Ventricular Rate:  59 PR Interval:  156 QRS Duration:  84 QT Interval:  444 QTC Calculation: 440 R Axis:   31  Text Interpretation: Sinus rhythm Low voltage, precordial leads Confirmed by Gloris Manchester ([id number]) on 03/19/2023 10:03:05 AM  Radiology DG Chest Portable 1 View Result Date: 03/19/2023 CLINICAL DATA:  Productive cough with yellow discharge for past 2 days EXAM: PORTABLE CHEST - 1 VIEW COMPARISON:  04/22/2018 FINDINGS: Cardiomediastinal silhouette and pulmonary vasculature are within normal limits. Moderate elevation of the right hemidiaphragm.  Lungs are clear. IMPRESSION: No acute cardiopulmonary process. Electronically Signed   By: Acquanetta Belling M.D.   On: 03/19/2023 08:21    Procedures Procedures    Medications Ordered in ED Medications  albuterol (VENTOLIN HFA) 108 (90 Base) MCG/ACT inhaler 2 puff (has no administration in time range)  ipratropium-albuterol (DUONEB) 0.5-2.5 (3) MG/3ML nebulizer solution 3 mL (3 mLs Nebulization Given 03/19/23 0955)    ED Course/ Medical Decision Making/ A&P                                 Medical Decision Making Amount and/or Complexity of  Data Reviewed Labs: ordered. Radiology: ordered.  Risk Prescription drug management.   This patient presents to the ED for concern of cough, this involves an extensive number of treatment options, and is a complaint that carries with it a high risk of complications and morbidity.  The differential diagnosis includes URI, pneumonia, bronchitis, environmental allergen   Co morbidities  that complicate the patient evaluation  HTN, anemia, arthritis, chronic pain, tobacco use, metastatic prostate cancer   Additional history obtained:  Additional history obtained from N/A External records from outside source obtained and reviewed including EMR   Lab Tests:  I Ordered, and personally interpreted labs.  The pertinent results include: Baseline anemia, no leukocytosis, creatinine increased from lab work from 2 years ago, normal electrolytes   Imaging Studies ordered:  I ordered imaging studies including chest x-ray I independently visualized and interpreted imaging which showed no acute findings I agree with the radiologist interpretation   Cardiac Monitoring: / EKG:  The patient was maintained on a cardiac monitor.  I personally viewed and interpreted the cardiac monitored which showed an underlying rhythm of: Sinus rhythm   Problem List / ED Course / Critical interventions / Medication management  Patient presents for productive cough for the past 2 days.  On arrival, vital signs are normal.  SpO2 is normal on room air.  His breathing is unlabored.  On lung auscultation, he does have scattered areas of wheezing and rhonchi.  He does not have a known diagnosis of reactive airway disease.  He does have a long smoking history.  DuoNeb was ordered.  Workup was initiated.  Patient's lab work is reassuring.  No leukocytosis is present.  His creatinine is increased when compared to lab work from March of last year.  Chronicity of this increase is unclear.  Patient was advised to stay  hydrated at home and follow-up with PCP for further lab work.  Although there is no evidence of focal opacity on x-ray, patient to be treated for atypical pneumonia.  Following breathing treatment he did report improved symptoms.  He was ordered albuterol inhaler to go.  Azithromycin was prescribed.  Patient was discharged in stable condition. I ordered medication including DuoNeb for cough Reevaluation of the patient after these medicines showed that the patient improved I have reviewed the patients home medicines and have made adjustments as needed   Social Determinants of Health:  Has PCP        Final Clinical Impression(s) / ED Diagnoses Final diagnoses:  Acute cough    Rx / DC Orders ED Discharge Orders          Ordered    azithromycin (ZITHROMAX) 250 MG tablet  Daily        03/19/23 1035              Gloris Manchester, MD 03/19/23 1035

## 2023-03-19 NOTE — Discharge Instructions (Addendum)
Use inhaler as needed for cough.  A prescription for an antibiotic was sent to your pharmacy.  Take as prescribed for the next 5 days.  Follow-up with your PCP.  Return to the emergency department for any new or worsening symptoms of concern.

## 2023-03-27 NOTE — Unmapped (Signed)
[facility] Specialty and Home Delivery Pharmacy Refill Coordination Note    Specialty Medication(s) to be Shipped:   Hematology/Oncology: [name]    Other medication(s) to be shipped: No additional medications requested for fill at this time     Calvin Obrien, DOB: [birth year]  Phone: [phone] (home)       All above HIPAA information was verified with patient.     Was a Nurse, learning disability used for this call? No    Completed refill call assessment today to schedule patient's medication shipment from the [facility]  [phone]).  All relevant notes have been reviewed.     Specialty medication(s) and dose(s) confirmed: Regimen is correct and unchanged.   Changes to medications: [place] reports no changes at this time.  Changes to insurance: No  New side effects reported not previously addressed with a pharmacist or physician: None reported  Questions for the pharmacist: No    Confirmed patient received a Conservation officer, historic buildings and a Surveyor, mining with first shipment. The patient will receive a drug information handout for each medication shipped and additional FDA Medication Guides as required.       DISEASE/MEDICATION-SPECIFIC INFORMATION        N/A    SPECIALTY MEDICATION ADHERENCE     Medication Adherence    Patient reported X missed doses in the last month: 0  Specialty Medication: Xtandi 40 mg  Patient is on additional specialty medications: No  Informant: patient     Were doses missed due to medication being on hold? No    Xtandi 40 mg: 14 days of medicine on hand       REFERRAL TO PHARMACIST     Referral to the pharmacist: Not needed      [facility]     Shipping address confirmed in Epic.       Delivery Scheduled: Yes, Expected medication delivery date: 04/03/23.     Medication will be delivered via UPS to the prescription address in Epic [place].    Calvin Obrien M Calvin Obrien   [facility] Special[facility] Home Delivery Pharm[facility]  Specialty Technician

## 2023-04-02 MED FILL — XTANDI 40 MG TABLET: ORAL | 30 days supply | Qty: 60 | Fill #1

## 2023-04-18 ENCOUNTER — Encounter (INDEPENDENT_AMBULATORY_CARE_PROVIDER_SITE_OTHER): Payer: Self-pay | Admitting: Vascular Surgery

## 2023-04-23 NOTE — Unmapped (Signed)
[facility] VASCULAR SURGERY      Patient Name: Calvin Obrien  Encounter Date: 04/24/2023  Referring provider: Durene Cal  Surgeon:  Sherrie Sport, MD    ASSESSMENT     73 y.o. male with hx of BLE PAD with bilateral R>L foot numbness and pain and severe calf claudication that occurs < 200 feet.    Imaging demonstrates right multisegmental SFA, pop, and tibial >50% stenosis and possible distal SFA occlusion. On the left there is CFA occlusion distally (should still be able to stick above for a RLE procedure) and then also scattered multisegmental >50% stenosis of the SFA/pop/tibials, although exact degree cannot be assessed due to severity of inflow disease.     Patient has persistent lifestyle limiting claudication despite optimal medical risk factor management. He is most concerned about his right foot pain and numbness, which is consistent with rest pain although it does not improve with foot dangling. Therefore patient is recommended for right endovascular intervention.     The left foot is less bothersome and he can manage the claudication for now, although a walking program is unlikely to help as he has CFA occlusion. After the right leg is treated we will revisit the risks/benefits/alternatives to left common femoral endartectomy and distal endovascular intervention, but will also need to work on smoking cessation if it is just claudication on the left.     Procedure for the right, as well as risks and benefits and alternatives to this procedure were discussed with the patient. The material risks, as discussed, include but are not limited to MI, bleeding including need for access site intervention, death, over-sedation with cardiopulmonary compromise, leg ischemia, need for re-intervention, distal embolization, failure to open artery, dissection and limb loss. Alternative procedures including non-operative management as well as open revascularization were discussed. The patient???s questions were answered to their satisfaction and they demonstrated a general understanding of the above. We recommend patient undergo RLE angiogram with possible angioplasty, stenting or atherectomy.    PLAN     Will be scheduled into VIR for intervention for rest pain on the right  Aggressive risk factor modification as below    Aggressive Risk Factor Modification     Medication management:  1.  Statin: Zetia 10 mg. Recommend high-dose statin therapy, regardless of baseline LDL, if no contraindication for pleomorphic effects. Will discus with PCP.  2.  Antiplatelet therapy: Recommend resuming ASA 81 . After intervention will also require ULD xarelto.  3.  Hypertension: Controlled.     Lifestyle modification:  1.  Diet: Recommend heart healthy mediterranean type diet  2. Smoking cessation: Discussed the importance of smoking cessation.  Smoking cessation program was offered to patient at today's office visit.  3. Exercise: Recommend minimum 150 minutes of brisk physical activity weekly.      HISTORY OF PRESENT ILLNESS     Calvin Obrien is a 73 y.o. male with history of metastatic prostate cancer, colon cancer, Luetscher's syndrome, HLD, and HTN, who presents with complaints of bilateral extremity pain. Patient was seen in consultation at the request of Rockie Neighbours, MD. Patient describes pain as numbness that is constant in the right foot and pain every time he bears weight on it. Leg dangling does not change the symptoms. He states he left foot used to feel like this, but he had some kind of prior vascular intervention on the left and that resolved his foot numbness/pain. The pain is localized to the right foot. When he walks to  the mailbox, he must stop at least once. He has bilateral calf claudication and says that the leg fatigue goes all the way up to his thighs. He denies hip pain. He does have low back pain. The leg pain resolves after a few minutes of rest, but the right foot pain does not resolve until he is able to get off his foot.   He denies rest pain or wounds on lower extremities that do not heal. Current smoker.    Interventions to date:  None Vascular? Patient reports something vascular done on the left.    ROS: The balance of 10 systems reviewed is negative except as noted in the HPI     RX/ ALLERGIES/ MED HX/SURG HX/ SOC HX/FAM HX: Reviewed & updated in Epic    Current Outpatient Medications   Medication Instructions    alendronate (FOSAMAX) 70 MG tablet TAKE ONE TABLET BY MOUTH WEEKLY    DULERA 200-5 mcg/actuation HFAA 20 mcg, 2 times a day (standard)    ezetimibe (ZETIA) 10 mg, Daily (standard)    gabapentin (NEURONTIN) 600 mg, 2 times a day (standard)    hydroCHLOROthiazide (HYDRODIURIL) 25 mg, Daily (standard)    HYDROcodone-acetaminophen (NORCO) 5-325 mg per tablet 1 tablet    lisinopril (PRINIVIL,ZESTRIL) 40 mg, Daily (standard)    MYRBETRIQ 25 mg, Oral, Daily (standard)    omeprazole (PRILOSEC) 20 mg, Daily (standard)    oxybutynin (DITROPAN) 5 MG tablet Take by mouth. 5 mg qAM and 15 mg qPM    oxyCODONE (ROXICODONE) 5 mg, Daily    sildenafil (VIAGRA) 100 mg, Oral, Nightly PRN    tadalafil 20 mg, Oral, Daily    tamsulosin (FLOMAX) 0.4 mg, Oral, Daily (standard)    traMADol (ULTRAM) 50 mg, Every 6 hours PRN    XTANDI 80 mg, Oral, Daily (standard), Swallow tablets whole. Do not chew, crush or split tablets.        PHYSICAL EXAM     Vitals:    04/24/23 1350   BP: 133/58   Pulse: 65   Temp: 36.5 ??C (97.7 ??F)     General: well appearing, no acute distress   Head: normocephalic, atraumatic  Cardiac: RRR   Lungs: Easy work of breathing, CTA Bilaterally  Abdomen: Soft, non-distended, non tender, without mass  MSK: negative joint tenderness, normal gait and ROM  Skin: intact, good turgor, sparse hair growth pattern  Neuro: AAO x 3, appropriate to questions  Extremities: Upper extremities warm and perfused. Bilateral lower extremities negative for edema, ulceration or digital gangrene.   Pulses: Bilateral radial pulses palpable. Monophasic DP/PT signals    DIAGNOSTICS     Images and reports reviewed independently by Dr. Karie Mainland.     BLE Arterial Duplex (04/24/2023):     EMG (07/19/2022): Abnormal study. 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     Labs:  Lab Results   Component Value Date    CREATININE 1.09 06/30/2021    A1C 5.5 03/08/2017       I personally spent 40 minutes of face-to-face and non-face-to-face in the care of this patient, which includes all pre, intra, and post visit time on the date of service.  All documented time was specific to the E/M visit and does not include any procedures that may have been performed.  ______________________________________________________________________    Documentation assistance was provided by Johney Maine, Scribe, on April 24, 2023 at 1:48 PM. for Galen Daft, [place], and [employer],  MD    ---  April 24, 2023 3:50 PM. Documentation assistance provided by the Scribe. I was present during the time the encounter was recorded. The information recorded by the Scribe was done at my direction and has been reviewed and validated by me.  ----------------------------------------------------------------------------------------------------------------------

## 2023-04-24 ENCOUNTER — Inpatient Hospital Stay: Admit: 2023-04-24 | Discharge: 2023-04-24 | Payer: MEDICARE

## 2023-04-24 ENCOUNTER — Ambulatory Visit: Admit: 2023-04-24 | Discharge: 2023-04-24 | Payer: MEDICARE | Attending: Vascular Surgery | Primary: Vascular Surgery

## 2023-04-24 DIAGNOSIS — M79671 Pain in right foot: Principal | ICD-10-CM

## 2023-04-24 DIAGNOSIS — I739 Peripheral vascular disease, unspecified: Principal | ICD-10-CM

## 2023-04-24 DIAGNOSIS — M79672 Pain in left foot: Principal | ICD-10-CM

## 2023-04-24 DIAGNOSIS — R6889 Other general symptoms and signs: Principal | ICD-10-CM

## 2023-04-24 NOTE — Unmapped (Signed)
Pre-Procedure Instructions    Date of Appointment: _____________________    Arrival Time: _____________________________    At your appointed time, please report to:    Registration on the ground floor of the [facility].  From there, you will receive directions up to the check-in desk of the Vascular and Interventional Radiology Department on the 2nd floor of the [facility].    Before your procedure, please follow these instructions:  No solid food 8 hours before your procedure.    You may drink clear liquids such as water, apple juice, soda and black coffee until 2 hours before your arrival time.    Tell your nurse or doctor if you have any allergies to X-ray dye or contrast.    Bring someone who can drive you home after the procedure.  If you use a van service or take a cab home, a responsible person will need to be with you (not the Cla[place] or cab driver).    If you take Coumadin/Warfarin, Pradaxa, Xarelto, or Eliquis follow the doctor's instructions on when you need to stop taking this medicine before the procedure.  Take your last dose of     _____________________________on: _____________________________.      If you take long acting insulin at night, please take 1/2 of this dose the night before your procedure to prevent hypoglycemia (a drop in you blood sugar) the day of your procedure     If you take metformin or any medicine with metformin, stop this medicine the day of your procedure and do not take it for 48 hours after the procedure because it can cause kidney problems.      Other oral diabetes medications should not be taken the morning of your procedure because you will not be eating.    Take your normal heart and blood pressure medicines and seizure medicines the morning of your procedure (or the night before, if this applies to you).    If you need to reschedule your procedure or have any questions, please contact the Vascular and Interventional Radiology Department at [phone].    Above listed instructions reviewed w/ patient; copy provided.

## 2023-04-26 NOTE — Unmapped (Signed)
[facility] Specialty and Home Delivery Pharmacy Refill Coordination Note    Specialty Medication(s) to be Shipped:   Hematology/Oncology: Calvin Obrien    Other medication(s) to be shipped: No additional medications requested for fill at this time     Calvin Obrien, DOB: 06-May-1950  Phone: [phone] (home)       All above HIPAA information was verified with patient.     Was a Nurse, learning disability used for this call? No    Completed refill call assessment today to schedule patient's medication shipment from the [facility] and Home Delivery Pharmacy  [phone]).  All relevant notes have been reviewed.     Specialty medication(s) and dose(s) confirmed: Regimen is correct and unchanged.   Changes to medications: Calvin Obrien reports no changes at this time.  Changes to insurance: No  New side effects reported not previously addressed with a pharmacist or physician: Yes - Patient reports back pain. Patient would like to speak to the pharmacist today. Their provider is not aware.  Questions for the pharmacist: No    Confirmed patient received a Conservation officer, historic buildings and a Surveyor, mining with first shipment. The patient will receive a drug information handout for each medication shipped and additional FDA Medication Guides as required.       DISEASE/MEDICATION-SPECIFIC INFORMATION        N/A    SPECIALTY MEDICATION ADHERENCE     Medication Adherence    Patient reported X missed doses in the last month: 0  Specialty Medication: Xtandi 40 mg  Patient is on additional specialty medications: No  Informant: patient     Were doses missed due to medication being on hold? No    Xtandi 40 mg: 14 days of medicine on hand       REFERRAL TO PHARMACIST     Referral to the pharmacist: Yes - patient request      SHIPPING     Shipping address confirmed in Epic.       Delivery Scheduled: Yes, Expected medication delivery date: 05/04/23.     Medication will be delivered via UPS to the temporary address in Epic [place].    Calvin Obrien   [facility] Specialty and Home Delivery Pharmacy  Specialty Technician

## 2023-04-27 NOTE — Unmapped (Signed)
I was asked to call Mr. Pehl to check to see if he is taking his Aspirin.  I spoke with Mr. Daisey, he stated that he will pick some up today and start taking it today. He is having his procedure on 05/03/23 and knows not to hold the Aspirin.

## 2023-05-01 NOTE — Unmapped (Addendum)
VIR pre procedure prep call completed. Reviewed to register at [phone] on ground floor [facility] then proceed to [facility].  Informed of no show/late cancellation policy. NPO guidelines reviewed. Pt OK to take sips of clear liquids with all AM meds.  Pt aware of need for driver >75 years of age able to stay throughout procedure and recovery. Made aware of visitation policy and temporary visitor age restrictions.  Pt verbalized understanding. All questions answered.     Using transportation arranged by social services.  Reenforced need to have someone to accompany him, verbalized understanding.  He states he is being admitted.  Upon chart review, no notes re: admit noted.    Called social services per his request to notify them of transportation needs for his procedure follow up on 2/25.  They also wanted to confirm that he would be admitted after because he only had them set up one way transportation.  Secure chat sent to J. Curcio.  Addendum:  no plan for admission.  LM with social services so return transportation can be arranged.    Addendum:  attempted to reach patient to notify of no planned admission but NA x 2 and unable to LM due to full MB.

## 2023-05-03 ENCOUNTER — Inpatient Hospital Stay: Admit: 2023-05-03 | Discharge: 2023-05-03 | Payer: MEDICARE

## 2023-05-03 DIAGNOSIS — M79672 Pain in left foot: Principal | ICD-10-CM

## 2023-05-03 DIAGNOSIS — R6889 Other general symptoms and signs: Principal | ICD-10-CM

## 2023-05-03 DIAGNOSIS — M79671 Pain in right foot: Principal | ICD-10-CM

## 2023-05-03 MED ORDER — RIVAROXABAN 2.5 MG TABLET
ORAL_TABLET | Freq: Two times a day (BID) | ORAL | 3 refills | 30.00 days | Status: CP
Start: 2023-05-03 — End: 2023-08-31

## 2023-05-03 MED ADMIN — heparin (porcine) 1000 unit/mL injection: INTRAVENOUS | @ 14:00:00 | Stop: 2023-05-03

## 2023-05-03 MED ADMIN — iodixanol (VISIPAQUE) 270 mg iodine/mL injection 50 mL: 50 mL | INTRA_ARTERIAL | @ 15:00:00 | Stop: 2023-05-03

## 2023-05-03 MED ADMIN — fentaNYL (PF) (SUBLIMAZE) injection: INTRAVENOUS | @ 14:00:00 | Stop: 2023-05-03

## 2023-05-03 MED ADMIN — sodium chloride (NS) 0.9 % infusion: INTRAVENOUS | @ 14:00:00 | Stop: 2023-05-03

## 2023-05-03 MED ADMIN — midazolam (VERSED) injection: INTRAVENOUS | @ 14:00:00 | Stop: 2023-05-03

## 2023-05-03 MED FILL — XTANDI 40 MG TABLET: ORAL | 30 days supply | Qty: 60 | Fill #2

## 2023-05-03 NOTE — Unmapped (Signed)
Discharge instructions reviewed and given to pt and family. Understanding verbalized. PIV removed with tip intact. Home supplies given for dressing changes. Pt discharged from PRU in stable condition via wheelchair.

## 2023-05-03 NOTE — Unmapped (Signed)
[facility] VASCULAR SURGERY  H&P NOTE       Assessment/Plan:    [name] is a 73 y.o. male who will undergo RLE angiogram with possible intervention, moderate sedation today.    --This procedure has been fully reviewed with the patient/patient???s authorized representative. The risks, benefits and alternatives have been explained, and the patient/patient???s authorized representative has consented to the procedure.  --The patient will accept blood products in an emergent situation.  --The patient does not have a Do Not Resuscitate order in effect.      CC: No chief complaint on file.      HPI: [name] is a 73 y.o. male with RLE PAD who will undergo RLE angiogram with possible intervention, moderate sedation.    Denies changes in medical history since seen in clinic. Denies history of contrast allergy. Denies history of anesthetic or sedation complications. Denies blood thinners.    Allergies:   Allergies   Allergen Reactions    Poison Ivy Extract      Poison Ivy- Rash per patient       Medications:   No current facility-administered medications for this encounter.       PMH:   Past Medical History:   Diagnosis Date    Acid reflux     Hyperlipemia     Hypertension     Prostate cancer (CMS-HCC)     bone metastasis       ASA Grade: ASA 3 - Patient with moderate systemic disease with functional limitations    ROS:  General: Denies fever or chills.  Cardiovascular: Denies chest pain.   Pulmonary: Denies shortness of breath, snoring, sleep apnea, or respiratory infection.  Allergies: Please see allergy section.       PE:    Vitals:    05/03/23 0642   BP: 137/54   Pulse: 69   Resp: 18   Temp: 37 ??C (98.6 ??F)   SpO2: 99%       General: WD, WN male in NAD.  Airway assessment: Class 3 - Can visualize soft palate  Lungs: Respirations nonlabored  Extremities:Warm and well perfused      Gerrie Nordmann, MD  Vascular Surgery Fellow

## 2023-05-04 NOTE — Unmapped (Signed)
 Post procedure phone call completed. Patient states he is doing well. Advised to call with any questions or concerns.

## 2023-05-10 NOTE — Unmapped (Signed)
Calvin Obrien called and left a message on the nurse line. Calvin Obrien stated that he is having sharp pain in his knee.  I called and spoke with him.  He states that the pain is a 6 or 7 out of a 10, that it is sharp and shooting fro his right knee to his right foot.  The pain comes and goes and is not constant.  The swelling, numbness in the foot, color and temperature of the leg is the same as it was before the procedure. He states that he is not sure what the incision site looks like because he has not changed the dressing.  He does have a friend that is coming over this evening to remove it.  I will notify Galen Daft PA and Dr Karie Mainland.

## 2023-05-17 ENCOUNTER — Inpatient Hospital Stay: Admit: 2023-05-17 | Discharge: 2023-05-18 | Payer: MEDICARE

## 2023-05-19 ENCOUNTER — Emergency Department (HOSPITAL_COMMUNITY): Payer: 59

## 2023-05-19 ENCOUNTER — Encounter (HOSPITAL_COMMUNITY): Payer: Self-pay

## 2023-05-19 ENCOUNTER — Other Ambulatory Visit: Payer: Self-pay

## 2023-05-19 ENCOUNTER — Emergency Department (HOSPITAL_COMMUNITY)
Admission: EM | Admit: 2023-05-19 | Discharge: 2023-05-19 | Disposition: A | Discharge status: Home / Self Care | Payer: 59 | Attending: Emergency Medicine | Admitting: Emergency Medicine

## 2023-05-19 DIAGNOSIS — R202 Paresthesia of skin: Secondary | ICD-10-CM | POA: Diagnosis not present

## 2023-05-19 DIAGNOSIS — M7989 Other specified soft tissue disorders: Secondary | ICD-10-CM | POA: Insufficient documentation

## 2023-05-19 DIAGNOSIS — R600 Localized edema: Secondary | ICD-10-CM | POA: Diagnosis not present

## 2023-05-19 DIAGNOSIS — R1032 Left lower quadrant pain: Secondary | ICD-10-CM | POA: Insufficient documentation

## 2023-05-19 LAB — CBC WITH DIFFERENTIAL/PLATELET
Abs Immature Granulocytes: 0.06 10*3/uL (ref 0.00–0.07)
Basophils Absolute: 0.1 10*3/uL (ref 0.0–0.1)
Basophils Relative: 1 %
Eosinophils Absolute: 0.4 10*3/uL (ref 0.0–0.5)
Eosinophils Relative: 5 %
HCT: 32.8 % — ABNORMAL LOW (ref 39.0–52.0)
Hemoglobin: 11 g/dL — ABNORMAL LOW (ref 13.0–17.0)
Immature Granulocytes: 1 %
Lymphocytes Relative: 18 %
Lymphs Abs: 1.5 10*3/uL (ref 0.7–4.0)
MCH: 31.7 pg (ref 26.0–34.0)
MCHC: 33.5 g/dL (ref 30.0–36.0)
MCV: 94.5 fL (ref 80.0–100.0)
Monocytes Absolute: 0.6 10*3/uL (ref 0.1–1.0)
Monocytes Relative: 7 %
Neutro Abs: 6 10*3/uL (ref 1.7–7.7)
Neutrophils Relative %: 68 %
Platelets: 318 10*3/uL (ref 150–400)
RBC: 3.47 MIL/uL — ABNORMAL LOW (ref 4.22–5.81)
RDW: 13.1 % (ref 11.5–15.5)
WBC: 8.7 10*3/uL (ref 4.0–10.5)
nRBC: 0 % (ref 0.0–0.2)

## 2023-05-19 LAB — COMPREHENSIVE METABOLIC PANEL
ALT: 10 U/L (ref 0–44)
AST: 18 U/L (ref 15–41)
Albumin: 3.5 g/dL (ref 3.5–5.0)
Alkaline Phosphatase: 59 U/L (ref 38–126)
Anion gap: 10 (ref 5–15)
BUN: 27 mg/dL — ABNORMAL HIGH (ref 8–23)
CO2: 23 mmol/L (ref 22–32)
Calcium: 8.7 mg/dL — ABNORMAL LOW (ref 8.9–10.3)
Chloride: 103 mmol/L (ref 98–111)
Creatinine, Ser: 1.24 mg/dL (ref 0.61–1.24)
GFR, Estimated: >60 mL/min (ref >60)
Glucose, Bld: 90 mg/dL (ref 70–99)
Potassium: 4 mmol/L (ref 3.5–5.1)
Sodium: 136 mmol/L (ref 135–145)
Total Bilirubin: 0.6 mg/dL (ref 0.0–1.2)
Total Protein: 6.9 g/dL (ref 6.5–8.1)

## 2023-05-19 LAB — URINALYSIS, ROUTINE W REFLEX MICROSCOPIC
Bilirubin Urine: NEGATIVE
Glucose, UA: NEGATIVE mg/dL
Hgb urine dipstick: NEGATIVE
Ketones, ur: NEGATIVE mg/dL
Leukocytes,Ua: NEGATIVE
Nitrite: NEGATIVE
Protein, ur: NEGATIVE mg/dL
Specific Gravity, Urine: 1.014 (ref 1.005–1.030)
pH: 5 (ref 5.0–8.0)

## 2023-05-19 LAB — BRAIN NATRIURETIC PEPTIDE: B Natriuretic Peptide: 116 pg/mL — ABNORMAL HIGH (ref 0.0–100.0)

## 2023-05-19 LAB — LIPASE, BLOOD: Lipase: 27 U/L (ref 11–51)

## 2023-05-19 MED ORDER — FENTANYL CITRATE PF 50 MCG/ML IJ SOSY
25.0000 ug | PREFILLED_SYRINGE | Freq: Once | INTRAMUSCULAR | Status: AC
  Administered 2023-05-19: 25 ug via INTRAVENOUS
  Filled 2023-05-19: qty 1

## 2023-05-19 MED ORDER — SODIUM CHLORIDE 0.9 % IV BOLUS
500.0000 mL | Freq: Once | INTRAVENOUS | Status: AC
  Administered 2023-05-19: 500 mL via INTRAVENOUS

## 2023-05-19 MED ORDER — NAPROXEN 375 MG PO TABS: 375.0000 mg | ORAL_TABLET | Freq: Two times a day (BID) | ORAL | 0 refills | Status: AC

## 2023-05-19 MED ORDER — IOHEXOL 300 MG/ML  SOLN
100.0000 mL | Freq: Once | INTRAMUSCULAR | Status: AC | PRN
  Administered 2023-05-19: 100 mL via INTRAVENOUS

## 2023-05-19 NOTE — Discharge Instructions (Addendum)
You are seen in the emergency department today for abdominal pain and right leg numbness.  Your CT scan did not show a cause of your abdominal pain.  Your blood work was reassuring.  There is no sign of UTI, no kidney stone or diverticulitis or other cause.  Follow-up close with your PCP.  You prescribed medication to help with the pain that you can take if it recurs.  Come back to the ER if you have new or worsening symptoms.  Please follow-up with Dr. Hetty Blend for the poor circulation in your legs.  If you have color change to your legs or severe pain or other worsening symptoms come back to the ER right away.

## 2023-05-19 NOTE — ED Triage Notes (Signed)
Pt stated that he has had swelling in his feet for a few months now and has never been seen for it. Pt also complaining of pain to his LLQ.

## 2023-05-19 NOTE — ED Notes (Signed)
Urinal Given to patient and informed of need of urine sample.

## 2023-05-19 NOTE — ED Provider Notes (Signed)
[facility] EMERGENCY DEPARTMENT AT [facility] Provider Note   CSN: [id number] Arrival date & time: 05/19/23  1106     History {Add pertinent medical, surgical, social history, OB history to HPI:1} Chief Complaint  Patient presents with   Abdominal Pain   Foot Swelling    Willie Fowler is a 73 y.o. male.  He has PMH of chronic back pain, chronic opiate use, peripheral arterial disease comes the ER with left lower quadrant abdominal pain that started yesterday.  Denies nausea or vomiting, no diarrhea or constipation, no blood in the stool.  Denies urinary complaints.  She is the pain is constant and worse with palpation.  Denies alleviating factors.   He also reports chronic right foot swelling and numbness.  He had this for a long time, has known peripheral arterial disease, had a femoropopliteal stent on this side last month and states symptoms have continued.  Recent follow-up showed improved ABIs per record review.  He denies worsening pain, worsening numbness increased swelling, calf pain or swelling or other symptoms.  States it just never fully resolved after the surgery.     Abdominal Pain      Home Medications Prior to Admission medications   Medication Sig Start Date End Date Taking? Authorizing Provider  azithromycin (ZITHROMAX) 250 MG tablet Take 1 tablet (250 mg total) by mouth daily. Take first 2 tablets together, then 1 every day until finished. 03/19/23   Gloris Manchester, MD  enzalutamide [name]) 40 MG capsule Take 160 mg by mouth daily.    [provider]  ezetimibe (ZETIA) 10 MG tablet Take 10 mg by mouth daily.  08/18/11   [provider]  gabapentin (NEURONTIN) 600 MG tablet Take 600 mg by mouth 3 (three) times daily.  12/19/16   [provider]  hydrochlorothiazide (HYDRODIURIL) 25 MG tablet Take 25 mg by mouth daily.    [provider]  HYDROcodone-acetaminophen (NORCO/VICODIN) 5-325 MG tablet Take 1 tablet by mouth  every 6 (six) hours as needed for moderate pain (pain score 4-6). 02/12/23 02/12/24  Magnant, Charles L, PA-C  lisinopril (PRINIVIL,ZESTRIL) 40 MG tablet Take 40 mg by mouth daily. 12/25/14   [provider]  omeprazole (PRILOSEC) 20 MG capsule Take 20 mg by mouth daily.    [provider]  oxybutynin (DITROPAN) 5 MG tablet Take 5-15 mg by mouth See admin instructions. Taking 1 tablet (5mg ) in the morning and 3 tablets (15mg ) at bedtime.    [provider]  pregabalin (LYRICA) 150 MG capsule Take 150 mg by mouth 2 (two) times daily.     [provider]  tamsulosin (FLOMAX) 0.4 MG CAPS capsule Take 0.4 mg by mouth daily. 04/19/12   [provider]  VENTOLIN HFA 108 (90 BASE) MCG/ACT inhaler Inhale 2 puffs into the lungs every 4 (four) hours as needed for wheezing.  03/16/15   [provider]      Allergies    Patient has no known allergies.    Review of Systems   Review of Systems  Gastrointestinal:  Positive for abdominal pain.    Physical Exam Updated Vital Signs BP 124/69   Pulse 62   Temp 97.8 F (36.6 C) (Oral)   Resp 18   Ht 5\' 7"  (1.702 m)   Wt 79.4 kg   SpO2 99%   BMI 27.41 kg/m  Physical Exam Vitals and nursing note reviewed.  Constitutional:      General: He is not in acute  distress.    Appearance: He is well-developed.  HENT:     Head: Normocephalic and atraumatic.  Eyes:     Conjunctiva/sclera: Conjunctivae normal.  Cardiovascular:     Rate and Rhythm: Normal rate and regular rhythm.     Heart sounds: No murmur heard. Pulmonary:     Effort: Pulmonary effort is normal. No respiratory distress.     Breath sounds: Normal breath sounds.  Abdominal:     Palpations: Abdomen is soft.     Tenderness: There is abdominal tenderness in the left lower quadrant. There is no guarding or rebound.  Musculoskeletal:        General: No swelling.     Cervical back: Neck supple.  Skin:    General: Skin is warm and dry.      Capillary Refill: Capillary refill takes less than 2 seconds.  Neurological:     Mental Status: He is alert.  Psychiatric:        Mood and Affect: Mood normal.     ED Results / Procedures / Treatments   Labs (all labs ordered are listed, but only abnormal results are displayed) Labs Reviewed  CBC WITH DIFFERENTIAL/PLATELET - Abnormal; Notable for the following components:      Result Value   RBC 3.47 (*)    Hemoglobin 11.0 (*)    HCT 32.8 (*)    All other components within normal limits  COMPREHENSIVE METABOLIC PANEL - Abnormal; Notable for the following components:   BUN 27 (*)    Calcium 8.7 (*)    All other components within normal limits  BRAIN NATRIURETIC PEPTIDE - Abnormal; Notable for the following components:   B Natriuretic Peptide 116.0 (*)    All other components within normal limits  LIPASE, BLOOD  URINALYSIS, ROUTINE W REFLEX MICROSCOPIC    EKG None  Radiology No results found.  Procedures Procedures  {Document cardiac monitor, telemetry assessment procedure when appropriate:1}  Medications Ordered in ED Medications - No data to display  ED Course/ Medical Decision Making/ A&P   {   Click here for ABCD2, HEART and other calculatorsREFRESH Note before signing :1}                              Medical Decision Making Amount and/or Complexity of Data Reviewed Labs: ordered. Radiology: ordered.  Risk Prescription drug management.   ***  {Document critical care time when appropriate:1} {Document review of labs and clinical decision tools ie heart score, Chads2Vasc2 etc:1}  {Document your independent review of radiology images, and any outside records:1} {Document your discussion with family members, caretakers, and with consultants:1} {Document social determinants of health affecting pt's care:1} {Document your decision making why or why not admission, treatments were needed:1} Final Clinical Impression(s) / ED Diagnoses Final diagnoses:  None     Rx / DC Orders ED Discharge Orders     None

## 2023-05-19 NOTE — ED Notes (Signed)
 Patient transported to CT

## 2023-05-20 NOTE — Unmapped (Signed)
 [facility] VASCULAR SURGERY      Patient Name: Calvin Obrien  Encounter Date: 05/22/2023  Referring provider: Janis Cuffe  Surgeon:  Sherrie Sport, MD    ASSESSMENT     73 y.o. male with hx of BLE PAD with bilateral R>L foot numbness and pain and severe calf claudication that occurs < 200 feet, s/p prior LLE intervention at OSH per pt report and most recent s/p RLE diagnostic arteriogram with shockwave lithotripsy of right CFA and SFA, drug coated balloon angioplasty of R SFA, drug coated balloon angioplasty of R CFA on 05/03/2023.     Patient reports improvement in pain and swelling following his procedure. Access site is healed with no hematoma upon examination. RLE duplex demonstrates ABIs of 1.02 R and 0.63 L, and great toe pressure of 67 mmHg R and 35 mmHg L, with right ABI and TBI have greatly improved s/p intervention and compared to previous exam 04/24/2023. Follow up in 3 months with BLE arterial duplex.     PLAN     Follow up in 3 months with BLE arterial duplex. Return precautions reviewed.   Refill of Xarelto provided today for a year.   Recommend patient follow up with PCP regarding statin therapy.   Aggressive risk factor modification as below    Aggressive Risk Factor Modification     Medication management:  1.  Statin: Zetia 10 mg. Recommend high-dose statin therapy, regardless of baseline LDL, if no contraindication. If patient unable to tolerate statins or LDL persistently elevated despite statin therapy consider referral to lipidologist. Will discus with PCP.  2.  Antiplatelet therapy: ASA 81  and xarelto 2.5 BID   3.  Hypertension: Controlled. Recommend patient work with PCP to meet goal BP <140/90     Lifestyle modification:  1.  Diet: Recommend heart healthy mediterranean type diet  2. Smoking cessation: Discussed the importance of smoking cessation. Smoking cessation program was offered to patient at today's office visit.   3. Exercise: Recommend minimum 150 minutes of brisk physical activity weekly.     Imaging, history, physical exam findings reviewed with attending who is in agreement with the above plan.    HISTORY OF PRESENT ILLNESS     Calvin Obrien is a 73 y.o. male with history of metastatic prostate cancer, colon cancer, Luetscher's syndrome, HLD, and HTN, who presents with complaints of bilateral extremity pain. Patient is s/p prior LLE vascular intervention at OSH per pt report and most recent s/p RLE diagnostic arteriogram with shockwave lithotripsy of right CFA and SFA, drug coated balloon angioplasty of R SFA, drug coated balloon angioplasty of R CFA on 05/03/2023. Of note, patient contacted clinic on 05/10/2023 stating that he had a sharp, shooting pain from his right knee to right foot, 6-7/10 pain.     Today, patient reports he is doing well. He presents today with his friend. He is a very limited historian regarding his medications. Patient denies issues with access sits. Patient states that his post operative pain is improving. Patient states his right foot continues to swell on the top and on the bottom and he received a pain shot yesterday. He states that this shot improved his right foot swelling. Patient states that he takes a cholesterol medication but is unsure what it is. His PCP is [facility]. Patient states that he walks every day. He denies rest pain or wounds on lower extremities that do not heal. Patient denies pain on his LLE> Current smoker (1 cpd).  Interventions to date:  - prior LLE vascular intervention per patient report at OSH   - 04/23/2023: RLE diagnostic arteriogram with shockwave lithotripsy of right CFA and SFA, drug coated balloon angioplasty of R SFA, drug coated balloon angioplasty of R CFA    ROS: The balance of 10 systems reviewed is negative except as noted in the HPI     RX/ ALLERGIES/ MED HX/SURG HX/ SOC HX/FAM HX: Reviewed & updated in Epic    Current Outpatient Medications   Medication Instructions    alendronate (FOSAMAX) 70 MG tablet TAKE ONE TABLET BY MOUTH WEEKLY    aspirin (ECOTRIN) 81 mg, Daily (standard)    DULERA 200-5 mcg/actuation HFAA 20 mcg, 2 times a day (standard)    ezetimibe (ZETIA) 10 mg, Daily (standard)    gabapentin (NEURONTIN) 600 mg, 2 times a day (standard)    hydroCHLOROthiazide (HYDRODIURIL) 25 mg, Daily (standard)    HYDROcodone-acetaminophen (NORCO) 5-325 mg per tablet 1 tablet    lisinopril (PRINIVIL,ZESTRIL) 40 mg, Daily (standard)    MYRBETRIQ 25 mg, Oral, Daily (standard)    omeprazole (PRILOSEC) 20 mg, Daily (standard)    oxybutynin (DITROPAN) 5 MG tablet Take by mouth. 5 mg qAM and 15 mg qPM    oxyCODONE (ROXICODONE) 5 mg, Daily    rivaroxaban (XARELTO) 2.5 mg, Oral, 2 times a day (standard)    sildenafil (VIAGRA) 100 mg, Oral, Nightly PRN    tadalafil 20 mg, Oral, Daily    tamsulosin (FLOMAX) 0.4 mg, Oral, Daily (standard)    traMADol (ULTRAM) 50 mg, Every 6 hours PRN    XTANDI 80 mg, Oral, Daily (standard), Swallow tablets whole. Do not chew, crush or split tablets.        PHYSICAL EXAM     Vitals:    05/22/23 0935   BP: 110/48   Pulse: 64   Temp: 36.5 ??C (97.7 ??F)     General: well appearing, no acute distress   Head: normocephalic, atraumatic  Cardiac: RRR   Lungs: Easy work of breathing, CTA Bilaterally  Abdomen: Soft, non-distended, non tender, without mass  MSK: negative joint tenderness, normal gait and ROM  Skin: intact, good turgor, sparse hair growth pattern, access site is healed, there is no hematoma   Neuro: AAO x 3, appropriate to questions  Extremities: Upper extremities warm and perfused. Bilateral lower extremities negative for edema, ulceration or digital gangrene.   Pulses: Bilateral radial pulses palpable.     DIAGNOSTICS     Images and reports reviewed independently by Dr. Karie Mainland.     RLE Arterial Duplex (05/17/2023): Evidence of right outflow and runoff disease, with ABIs of 1.02 R and 0.63 L, and great toe pressure of 67 mmHg R and 35 mmHg L. Right ABI and TBI have greatly improved s/p intervention and compared to previous exam 04/24/2023.     Labs:  Lab Results   Component Value Date    CREATININE 1.09 06/30/2021    A1C 5.5 03/08/2017     I personally spent 30 minutes of face-to-face and non-face-to-face in the care of this patient, which includes all pre, intra, and post visit time on the date of service.  All documented time was specific to the E/M visit and does not include any procedures that may have been performed.  ______________________________________________________________________    Documentation assistance was provided by Johney Maine, Scribe, on May 22, 2023 at 9:46 AM. for Galen Daft, PA, and Tomasa Hose, MD    ---  May 22, 2023 11:29 AM. Documentation assistance provided by the Scribe. I was present during the time the encounter was recorded. The information recorded by the Scribe was done at my direction and has been reviewed and validated by me.  ----------------------------------------------------------------------------------------------------------------------

## 2023-05-22 ENCOUNTER — Ambulatory Visit: Admit: 2023-05-22 | Discharge: 2023-05-23 | Payer: MEDICARE

## 2023-05-22 DIAGNOSIS — R6889 Other general symptoms and signs: Principal | ICD-10-CM

## 2023-05-22 DIAGNOSIS — M79671 Pain in right foot: Principal | ICD-10-CM

## 2023-05-22 DIAGNOSIS — M79672 Pain in left foot: Principal | ICD-10-CM

## 2023-05-22 DIAGNOSIS — I739 Peripheral vascular disease, unspecified: Principal | ICD-10-CM

## 2023-05-22 MED ORDER — RIVAROXABAN 2.5 MG TABLET
ORAL_TABLET | Freq: Two times a day (BID) | ORAL | 11 refills | 30.00 days | Status: CP
Start: 2023-05-22 — End: 2024-05-16

## 2023-05-22 NOTE — Unmapped (Signed)
 It was a pleasure seeing you today.     Your ultrasound today showed your blood flow to the right leg is improved.    It is possible that the blockages will come back. We need to look for any signs of new blockages with ultrasounds. You will need to come back to clinic for a check up and an ultrasound to evaluate your arteries at 1, 3, 6, and 12 months during the the first year, and then once a year after that.     Please remember to check your blood pressure regularly. Call your PCP to schedule a visit to make sure your blood pressure is at goal.    Quit Smoking! If you need help please let us know if we can refer you for smoking cessation assistance. You are also encouraged to speak to your PCP about medication options to help you quit.    We will plan to see you back in follow-up in 3 months, or sooner if you have new or worsening symptoms.  We have ordered imaging for that follow-up.    Please take all of your medications as prescribed. Be sure to follow-up with your PCP to review our recommendations.    Feel free to contact us with any questions or concerns.      Galen Daft, PA-C, FSVM  Sherrie Sport, MD, MPH  Division of Vascular Surgery, [place]  [phone]

## 2023-06-07 NOTE — Unmapped (Signed)
 The [facility] has made a second and final attempt to reach this patient to refill the following medication:Xtandi.      We have left voicemails on the following phone numbers: [phone], have been unable to leave messages on the following phone numbers: [phone], and have sent a text message to the following phone numbers: [phone] .    Dates contacted: 2/26,3/6  Last scheduled delivery: 05/03/23    The patient may be at risk of non-compliance with this medication. The patient should call the [facility] at [phone]  Option 4, then Option 1: Oncology to refill medication.    [facility] Specialty Technician

## 2023-06-14 ENCOUNTER — Other Ambulatory Visit: Admit: 2023-06-14 | Discharge: 2023-06-14 | Payer: MEDICARE

## 2023-06-14 ENCOUNTER — Ambulatory Visit: Admit: 2023-06-14 | Discharge: 2023-06-14 | Payer: MEDICARE | Attending: Medical Oncology | Primary: Medical Oncology

## 2023-06-14 ENCOUNTER — Ambulatory Visit: Admit: 2023-06-14 | Discharge: 2023-06-14 | Payer: MEDICARE

## 2023-06-14 DIAGNOSIS — C61 Malignant neoplasm of prostate: Principal | ICD-10-CM

## 2023-06-14 DIAGNOSIS — C7951 Secondary malignant neoplasm of bone: Principal | ICD-10-CM

## 2023-06-14 LAB — CBC W/ AUTO DIFF
BASOPHILS ABSOLUTE COUNT: 0.1 10*9/L (ref 0.0–0.1)
BASOPHILS RELATIVE PERCENT: 0.9 %
EOSINOPHILS ABSOLUTE COUNT: 0.5 10*9/L (ref 0.0–0.5)
EOSINOPHILS RELATIVE PERCENT: 7.4 %
HEMATOCRIT: 35.1 % — ABNORMAL LOW (ref 39.0–48.0)
HEMOGLOBIN: 12.1 g/dL — ABNORMAL LOW (ref 12.9–16.5)
LYMPHOCYTES ABSOLUTE COUNT: 2.1 10*9/L (ref 1.1–3.6)
LYMPHOCYTES RELATIVE PERCENT: 28.5 %
MEAN CORPUSCULAR HEMOGLOBIN CONC: 34.5 g/dL (ref 32.0–36.0)
MEAN CORPUSCULAR HEMOGLOBIN: 31.7 pg (ref 25.9–32.4)
MEAN CORPUSCULAR VOLUME: 92 fL (ref 77.6–95.7)
MEAN PLATELET VOLUME: 8.4 fL (ref 6.8–10.7)
MONOCYTES ABSOLUTE COUNT: 0.7 10*9/L (ref 0.3–0.8)
MONOCYTES RELATIVE PERCENT: 10.1 %
NEUTROPHILS ABSOLUTE COUNT: 3.8 10*9/L (ref 1.8–7.8)
NEUTROPHILS RELATIVE PERCENT: 53.1 %
PLATELET COUNT: 236 10*9/L (ref 150–450)
RED BLOOD CELL COUNT: 3.82 10*12/L — ABNORMAL LOW (ref 4.26–5.60)
RED CELL DISTRIBUTION WIDTH: 13.9 % (ref 12.2–15.2)
WBC ADJUSTED: 7.2 10*9/L (ref 3.6–11.2)

## 2023-06-14 LAB — PSA: PROSTATE SPECIFIC ANTIGEN: <0.04 ng/mL (ref 0.00–4.00)

## 2023-06-14 MED ADMIN — leuprolide (LUPRON) injection 22.5 mg: 22.5 mg | INTRAMUSCULAR | @ 14:00:00 | Stop: 2023-06-14

## 2023-06-14 NOTE — Unmapped (Signed)
 [facility] Delivery Pharm[facility] Refill Coordination Note    Specialty Medication(s) to be Shipped:   Hematology/Oncology: [name]    Other medication(s) to be shipped: No additional medications requested for fill at this time     Calvin Obrien, DOB: [birth year]  Phone: [phone] (home)       All above HIPAA information was verified with patient's caregiver, Nurse Alva Garnet      Was a translator used for this call? No    Completed refill call assessment today to schedule patient's medication shipment from the [facility]  [phone]).  All relevant notes have been reviewed.     Specialty medication(s) and dose(s) confirmed: Regimen is correct and unchanged.   Changes to medications: Mays reports no changes at this time.  Changes to insurance: No  New side effects reported not previously addressed with a pharmacist or physician: None reported  Questions for the pharmacist: No    Confirmed patient received a Conservation officer, historic buildings and a Surveyor, mining with first shipment. The patient will receive a drug information handout for each medication shipped and additional FDA Medication Guides as required.       DISEASE/MEDICATION-SPECIFIC INFORMATION        N/A    SPECIALTY MEDICATION ADHERENCE     Medication Adherence    Patient reported X missed doses in the last month: 0  Specialty Medication: XTANDI 40 mg tablet (enzalutamide)  Patient is on additional specialty medications: No  Patient is on more than two specialty medications: No  Any gaps in refill history greater than 2 weeks in the last 3 months: no  Demonstrates understanding of importance of adherence: yes              Were doses missed due to medication being on hold? No    XTANDI 40   mg: 7 days of medicine on hand       REFERRAL TO PHARMACIST     Referral to the pharmacist: Not needed      [facility]     Shipping address confirmed in Epic.     Cost and Payment: Patient has a $0 copay, payment information is not required.    Delivery Scheduled: Yes, Expected medication delivery date: 06/19/23.     Medication will be delivered via UPS to the prescription address in Epic WAM.    Calvin Obrien   [facility]  Specialty Technician

## 2023-06-14 NOTE — Unmapped (Signed)
 Addended by: Darrelyn Hillock on: 06/14/2023 11:59 AM     Modules accepted: Orders

## 2023-06-14 NOTE — Unmapped (Signed)
 [facility] pharmacy on patient's behalf. Patient would like his medication mailed to [address], [place], [place] [address].    Informed patient that pharmacy states they will send out his medication and he should receive it on Tuesday, 06/19/23.

## 2023-06-14 NOTE — Unmapped (Signed)
 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  After adding enzalutamide, PSA dropped to non-detectable, has remained undetectable.    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  He is followed in a pain clinic and has seen Palliative Care.    He is feeling very well with good energy. No change since prior visits.    Based on prior PSA values and how he is feeling, we will continue current therapy with Lupron and enzalutamide.    Lab Results   Component Value Date    PSA <0.04 06/14/2023    PSA 0.05 03/12/2023    PSA <0.04 10/19/2022    PSA <0.04 05/25/2022    PSA <0.04 02/02/2022    PSA <0.04 10/13/2021       Plan:   - Follow up PSA q3 months  - Continue 65-month Lupron 22.5mg  (02/02/22).  (I tried to change to 40-month injection but insurance refused this apparently.) Will receive dose today (06/2023).  - Pain - has been managed by palliative care.  - Dose reduced enzalutamide to see if improves his skin reaction (dry irritated skin on face. Legs) - two 40mg  tablets in the morning only now (previously switched to 40 mg tablets because he was mechanically unable to swallow the larger tablets).  Skin issues have resolved.  - He was previously referred to [facility] for ED assessment (and can also discuss urinary leakage).  - Follow up PSA.  - He will follow up with his pain clinic and with palliative Care here as warranted.  - He will meet about gas money today to help him out.  - Urinary leakage: He has seen Barkley Bruns, NP for evaluation. He reports taking 1 pill in AM, 3 pills in PM, unclear which medication, requested that he bring medications with him to next visit.  - BMD scan was done 11/25/20 with low bone density - started Fosamax.  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  - For ED, has seen Dr. Colon Branch in the past.  Viagra does not help.  He has spoken with Urology about options.    - Spine: He had prior decompression, now doing back exercises with substantial improvement of symptoms.  - Hot flashes - on Gabapentin 300 TID, Katina Degree PhD has counseled, pharmacy seeing today again. Stable  -   Code Status: Prior     I spent 45 minutes on records review, meeting with the patient, documentation and coordination on the day of service.    RTC in 3 months for next Lupron, and to check health status.      -------------------------------    Other Physicians:   Dr. Maryruth Eve [facility])  Dr. Carnella Guadalajara [facility])  Dr. Nolon Rod (referring; Radiation Oncology)   Dr. Baxter Flattery (Urology)   Dr. Nuala Alpha (Anesthesia pain)   Dr. Hoy Morn (Urology, RE: ED)   Dr. 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  Current therapy: Lupron    Oncology history:   - Diagnosed 2008 with prostate cancer, PSA 13.5.   - Prostate biopsy on 04/02/07 w Gleason 3+4=7   - 2009 Radiation therapy to w Dr. Nonie Hoyer 78 Gy.   - 2012-13: Rising PSA   - 11/13: Saw [facility] rad onc, felt not candidate for salvage RT   - 2/14: BS c/w 2009 with suspicious increased uptake at T5, L inferior iliac bone, subtle at L1.   - CT A/P shows sclerosis at L4-5 equivocal for DJD vs metastases, I discussed w radiology, felt most likely metastatic.   - 3/14: Lupron started   - 12/14: Lupron held for intermittent approach  - 11/13/13: Restarted Lupron due to PSA rise with worsening back pain  - 8/15: Palliative RT to spine in [place], [place].  - 05/25/14:  MRI spine: Multilevel degenerative changes, most prominent at L3-4 and L4-L5, where there is marked spinal canal stenosis and crowding with potential indentation of the nerve roots. Severe bilateral neural foraminal narrowing at L4-5 and moderate to marked neuroforaminal narrowing at L3-4 on the right. Additional multilevel degenerative changes as outlined above.    - 08/15/15: Abnormal focal marrow signal and enhancement at the endplates surrounding the L4-5 disc space are favored to represent acutely inflamed Schmorl's nodes. Metastatic disease is felt less likely but is not entirely excluded, particularly given enlargement of the lesion at the L5 level. Continued follow-up recommended.  - 6/17: Bone mineral density: The femoral neck density is mildly low, but the spine and total femoral densities are normal.  - 10/17: L2-S1 decompression surgery w Dr. 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  - 12/17: L ankle pain, negative xray.  - 04/20/18: CT A/P: Stable heterogeneity of the left iliac wing representing known metastatic disease. No new evidence of metastatic disease within the abdomen/pelvis.  - 04/25/18: Bone scan: Increased radiotracer activity within the left iliac bone concerning for osseous metastases.  - 08/08/18: Castration resistant by PSA - start process for enzalutamide  -06/14/23: PSA remains undetectable    ROS: Chronic ongoing fatigue which is worse on Lupron but stable since last visit.  Ongoing back pain w L sciatica.  Hot flashes persist on Lupron, daily.  Nocturia/chronic dysuria: much better with Ditropan 0-1x/night.  No pain.  Persistent ED.  Remainder of 10 system ROS otherwise negative.    Physical exam:   BP 142/65  - Pulse 75  - Temp 36.3 ??C (97.4 ??F) (Temporal)  - Ht 170.2 cm (5' 7)  - Wt 80.3 kg (177 lb)  - SpO2 99%  - BMI 27.72 kg/m??   NAD  A+Ox3  No visible rash  No dyspnea or cough  No apparent neurological deficit    Current Outpatient Medications Medication Sig Dispense Refill    alendronate (FOSAMAX) 70 MG tablet TAKE ONE TABLET BY MOUTH WEEKLY 4 tablet 11    aspirin (ECOTRIN) 81 MG tablet Take 1 tablet (81 mg total) by mouth daily.      DULERA 200-5 mcg/actuation HFAA 20 mcg Two (2) times a day.      enzalutamide (XTANDI) 40 mg tablet Take 2 tablets (80 mg total) by mouth daily. Swallow tablets whole. Do not chew, crush or split tablets. 60 tablet 5    ezetimibe (ZETIA) 10 mg tablet Take 1 tablet (10 mg total) by mouth daily. Frequency:QD   Dosage:10   MG  Instructions:  Note:Dose: 10MG       gabapentin (  NEURONTIN) 600 MG tablet Take 1 tablet (600 mg total) by mouth two (2) times a day.      hydroCHLOROthiazide (HYDRODIURIL) 25 MG tablet Take 1 tablet (25 mg total) by mouth daily.      HYDROcodone-acetaminophen (NORCO) 5-325 mg per tablet Take 1 tablet by mouth.      lisinopril (PRINIVIL,ZESTRIL) 40 MG tablet Take 1 tablet (40 mg total) by mouth daily.      MYRBETRIQ 25 mg Tb24 extended-release tablet TAKE ONE TABLET BY MOUTH ONCE DAILY 30 tablet 6    omeprazole (PRILOSEC) 20 MG capsule Take 1 capsule (20 mg total) by mouth daily.      oxybutynin (DITROPAN) 5 MG tablet Take by mouth. 5 mg qAM and 15 mg qPM  0    oxyCODONE (ROXICODONE) 5 MG immediate release tablet 1 tablet (5 mg total)  in the morning.      rivaroxaban (XARELTO) 2.5 mg tablet Take 1 tablet (2.5 mg total) by mouth two (2) times a day. 60 tablet 11    sildenafiL (VIAGRA) 100 MG tablet Take 1 tablet (100 mg total) by mouth nightly as needed for erectile dysfunction. 90 tablet 3    tadalafil 20 MG tablet Take 1 tablet (20 mg total) by mouth in the morning. 90 tablet 3    tamsulosin (FLOMAX) 0.4 mg capsule Take 1 capsule (0.4 mg total) by mouth daily. 30 capsule 11    traMADol (ULTRAM) 50 mg tablet Take 1 tablet (50 mg total) by mouth every six (6) hours as needed for pain.       No current facility-administered medications for this visit.       Allergies   Allergen Reactions    Poison Ivy Extract      Poison Ivy- Rash per patient       Laboratory:   06/27/12: PSA=3 (on Casodex), testosterone >800     Lab Results   Component Value Date    PSA Diagnostic 0.6 05/14/2014    PSA Diagnostic 0.5 02/05/2014    PSA Diagnostic 6.9 (H) 11/06/2013    PSA Diagnostic 2.4 07/24/2013    PSA Diagnostic 0.3 03/20/2013    PSA Diagnostic 0.2 12/26/2012       Lab on 06/14/2023   Component Date Value Ref Range Status    PSA 06/14/2023 <0.04  0.00 - 4.00 ng/mL Final    WBC 06/14/2023 7.2  3.6 - 11.2 10*9/L Final    RBC 06/14/2023 3.82 (L)  4.26 - 5.60 10*12/L Final    HGB 06/14/2023 12.1 (L)  12.9 - 16.5 g/dL Final    HCT 16/01/9603 35.1 (L)  39.0 - 48.0 % Final    MCV 06/14/2023 92.0  77.6 - 95.7 fL Final    MCH 06/14/2023 31.7  25.9 - 32.4 pg Final    MCHC 06/14/2023 34.5  32.0 - 36.0 g/dL Final    RDW 54/12/8117 13.9  12.2 - 15.2 % Final    MPV 06/14/2023 8.4  6.8 - 10.7 fL Final    Platelet 06/14/2023 236  150 - 450 10*9/L Final    Neutrophils % 06/14/2023 53.1  % Final    Lymphocytes % 06/14/2023 28.5  % Final    Monocytes % 06/14/2023 10.1  % Final    Eosinophils % 06/14/2023 7.4  % Final    Basophils % 06/14/2023 0.9  % Final    Absolute Neutrophils 06/14/2023 3.8  1.8 - 7.8 10*9/L Final    Absolute Lymphocytes 06/14/2023 2.1  1.1 -  3.6 10*9/L Final    Absolute Monocytes 06/14/2023 0.7  0.3 - 0.8 10*9/L Final    Absolute Eosinophils 06/14/2023 0.5  0.0 - 0.5 10*9/L Final    Absolute Basophils 06/14/2023 0.1  0.0 - 0.1 10*9/L Final     Lab Results   Component Value Date    PSA <0.04 06/14/2023    PSA 0.05 03/12/2023    PSA <0.04 10/19/2022    PSA <0.04 05/25/2022    PSA <0.04 02/02/2022    PSA <0.04 10/13/2021     3/17: Thyroid function WNL.

## 2023-06-14 NOTE — Unmapped (Signed)
 Pt tolerated Lupron 22.5 injection to the right upper outer dorsogluteal quadant without difficulty. Band aid and gauze placed over injection site. [facility],steady gait, NAD, no questions, complaints, nor concerns voiced at d/c.

## 2023-06-18 MED FILL — XTANDI 40 MG TABLET: ORAL | 30 days supply | Qty: 60 | Fill #3

## 2023-07-06 ENCOUNTER — Encounter: Payer: 59 | Admitting: Vascular Surgery

## 2023-07-06 ENCOUNTER — Encounter (HOSPITAL_COMMUNITY): Payer: 59

## 2023-07-12 ENCOUNTER — Other Ambulatory Visit: Payer: Self-pay | Admitting: *Deleted

## 2023-07-12 DIAGNOSIS — I739 Peripheral vascular disease, unspecified: Secondary | ICD-10-CM

## 2023-07-13 DIAGNOSIS — C61 Malignant neoplasm of prostate: Principal | ICD-10-CM

## 2023-07-13 DIAGNOSIS — R351 Nocturia: Principal | ICD-10-CM

## 2023-07-13 DIAGNOSIS — C7951 Secondary malignant neoplasm of bone: Principal | ICD-10-CM

## 2023-07-13 MED ORDER — MYRBETRIQ 25 MG TABLET,EXTENDED RELEASE
ORAL_TABLET | Freq: Every day | ORAL | 6 refills | 30.00 days | Status: CP
Start: 2023-07-13 — End: ?

## 2023-07-13 NOTE — Unmapped (Signed)
 Please refill if appropriate.     Most recent clinic visit: 06/14/2023  Next clinic visit: 09/13/2023

## 2023-07-19 MED FILL — XTANDI 40 MG TABLET: ORAL | 30 days supply | Qty: 60 | Fill #4

## 2023-07-19 NOTE — Progress Notes (Signed)
 Patient ID: Willie Fowler, male   DOB: [birth year], 73 y.o.   MRN: [id number]  Reason for Consult: No chief complaint on file.   Referred by Twylla Galen, MD  Subjective:     HPI  Willie Fowler is a 73 y.o. male presenting for evaluation of PAD.  He reports he had a stent in his left thigh placed about 7 years.  Today he has complaints of numbness throughout his feet all the time.  He denies 1 side being worse than the other.  He denies claudication.  He reports he is having pain in the small veins on his feet that he can see.  He denies true vasculogenic rest pain.  His wife reports he gets frustrated that he has decreased feeling in his feet.  He is currently smoking.  Past Medical History:  Diagnosis Date   Arthritis    Cancer [facility])    Prostate cancer   Chronic ankle pain    Chronic back pain    Chronic left shoulder pain    GERD (gastroesophageal reflux disease)    High cholesterol    History of alcohol abuse    Reportedly went through withdrawals after his 2017 back surgery.   Hypertension    Lumbar radiculopathy    Pain management    [facility]   Pneumonia    Family History  Problem Relation Age of Onset   Cancer Mother        breast   Cancer Sister        breast   Colon cancer Neg Hx    Past Surgical History:  Procedure Laterality Date   BACK SURGERY     COLONOSCOPY  2016   Dr. Lydia Sams: two 4-6 mm sigmoid colon polyps removed (adenomatous), mild left sided diverticulosis   COLONOSCOPY WITH PROPOFOL  N/A 12/05/2018   Procedure: COLONOSCOPY WITH PROPOFOL ;  Surgeon: Suzette Espy, MD;  Location: [facility];  Service: Endoscopy;  Laterality: N/A;  9:15am - pt knows to arrive at 8am for Covid test   EAR CYST EXCISION Left 12/18/2017   Procedure: EXCISION CYST LEFT SHOULDER;  Surgeon: Jasmine Mesi, MD;  Location: [facility] OR;  Service: Orthopedics;  Laterality: Left;   POLYPECTOMY  12/05/2018   Procedure: POLYPECTOMY;  Surgeon: Suzette Espy, MD;  Location: [facility];  Service: Endoscopy;;  colon   REVERSE SHOULDER ARTHROPLASTY Left 12/18/2017   Procedure: LEFT REVERSE SHOULDER ARTHROPLASTY;  Surgeon: Jasmine Mesi, MD;  Location[facility] OR;  Service: Orthopedics;  Laterality: Left;   TONSILLECTOMY      Short Social History:  Social History   Tobacco Use   Smoking status: Every Day    Current packs/day: 0.15    Average packs/day: 0.2 packs/day for 40.0 years (6.0 ttl pk-yrs)    Types: Cigarettes   Smokeless tobacco: Never   Tobacco comments:    "I smoke 2 cigs a day"  Substance Use Topics   Alcohol use: Yes    Comment: *PT reports he quit drinking 08/19/19* weekly-  BEER (12 ounces every few days)    No Known Allergies  Current Outpatient Medications  Medication Sig Dispense Refill   azithromycin  (ZITHROMAX ) 250 MG tablet Take 1 tablet (250 mg total) by mouth daily. Take first 2 tablets together, then 1 every day until finished. 6 tablet 0   enzalutamide (XTANDI) 40 MG capsule Take 160 mg by mouth daily.     ezetimibe  (ZETIA ) 10 MG tablet Take 10 mg by  mouth daily.      gabapentin  (NEURONTIN ) 600 MG tablet Take 600 mg by mouth 3 (three) times daily.      hydrochlorothiazide (HYDRODIURIL) 25 MG tablet Take 25 mg by mouth daily.     HYDROcodone -acetaminophen  (NORCO/VICODIN) 5-325 MG tablet Take 1 tablet by mouth every 6 (six) hours as needed for moderate pain (pain score 4-6). 20 tablet 0   lisinopril  (PRINIVIL ,ZESTRIL ) 40 MG tablet Take 40 mg by mouth daily.     naproxen  (NAPROSYN ) 375 MG tablet Take 1 tablet (375 mg total) by mouth 2 (two) times daily. 10 tablet 0   omeprazole  (PRILOSEC) 20 MG capsule Take 20 mg by mouth daily.     oxybutynin  (DITROPAN ) 5 MG tablet Take 5-15 mg by mouth See admin instructions. Taking 1 tablet (5mg ) in the morning and 3 tablets (15mg ) at bedtime.     pregabalin  (LYRICA ) 150 MG capsule Take 150 mg by mouth 2 (two) times daily.      tamsulosin  (FLOMAX ) 0.4 MG CAPS capsule Take 0.4 mg by mouth daily.      VENTOLIN  HFA 108 (90 BASE) MCG/ACT inhaler Inhale 2 puffs into the lungs every 4 (four) hours as needed for wheezing.      No current facility-administered medications for this visit.    REVIEW OF SYSTEMS  All other systems were reviewed and are negative     Objective:  Objective   There were no vitals filed for this visit. There is no height or weight on file to calculate BMI.  Physical Exam General: no acute distress Cardiac: hemodynamically stable Pulm: normal work of breathing Neuro: alert, no focal deficit Extremities: no edema, cyanosis or wounds Vascular:   Right: Palpable femoral, PT  Left: Palpable femoral   Data: ABI +---------+------------------+-----+--------+--------+  Right   Rt Pressure (mmHg)IndexWaveformComment   +---------+------------------+-----+--------+--------+  Brachial 129                                      +---------+------------------+-----+--------+--------+  PTA     152               1.16 biphasic          +---------+------------------+-----+--------+--------+  DP      154               1.18 biphasic          +---------+------------------+-----+--------+--------+  Great Toe95                0.73                   +---------+------------------+-----+--------+--------+   +---------+------------------+-----+----------+-------+  Left    Lt Pressure (mmHg)IndexWaveform  Comment  +---------+------------------+-----+----------+-------+  Brachial 131                                       +---------+------------------+-----+----------+-------+  PTA     68                0.52 monophasic         +---------+------------------+-----+----------+-------+  DP      61                0.47 monophasic         +---------+------------------+-----+----------+-------+  [name]  0.47                    +---------+------------------+-----+----------+-------+    +-------+-----------+-----------+------------+------------+  ABI/TBIToday's ABIToday's TBIPrevious ABIPrevious TBI  +-------+-----------+-----------+------------+------------+  Right 1.18       0.73       1.02        0.52          +-------+-----------+-----------+------------+------------+  Left  0.52       0.47       0.63        0.27          +-------+-----------+-----------+------------+------------+   Duplex +-----------+--------+-----+---------------+----------+--------+  RIGHT     PSV cm/sRatioStenosis       Waveform  Comments  +-----------+--------+-----+---------------+----------+--------+  CFA Distal 135                         biphasic            +-----------+--------+-----+---------------+----------+--------+  DFA       98                          biphasic            +-----------+--------+-----+---------------+----------+--------+  SFA Prox   169          30-49% stenosisbiphasic            +-----------+--------+-----+---------------+----------+--------+  SFA Mid    172          30-49% stenosisbiphasic            +-----------+--------+-----+---------------+----------+--------+  SFA Distal 90                          monophasic          +-----------+--------+-----+---------------+----------+--------+  POP Prox   71                          monophasic          +-----------+--------+-----+---------------+----------+--------+  PTA Distal 81                          biphasic            +-----------+--------+-----+---------------+----------+--------+  PERO Distal60                          biphasic            +-----------+--------+-----+---------------+----------+--------+  DP        62                          monophasic          +-----------+--------+-----+---------------+----------+--------+    CT abdomen pelvis from February 2025 independently review Severe calcific disease of bilateral  common femorals and proximal SFA     Assessment/Plan:     Willie Fowler is a 73 y.o. male with PAD who is asymptomatic at this time.  He has had a previous left SFA stent placed many years ago that is widely patent.  I explained that his right leg has essentially normal perfusion while his left leg is 0.53 with a toe pressure 61.  His main complaint today are symptoms of neuropathy and I explained that he is already on Lyrica  and he can discuss with his PCP about trialing a different  medication.  Will plan to continue to follow with annual surveillance with a ABI and left lower extremity duplex in 1 year  Recommendations to optimize cardiovascular risk: Abstinence from all tobacco products. Blood glucose control with goal A1c < 7%. Blood pressure control with goal blood pressure < 140/90 mmHg. Lipid reduction therapy with goal LDL-C <100 mg/dL  Aspirin  81mg  PO QD.  Atorvastatin 40-80mg  PO QD (or other "high intensity" statin therapy).     Philipp Brawn MD [facility]

## 2023-07-19 NOTE — Unmapped (Signed)
  [facility] Clinical Assessment & Refill Coordination Note    Calvin Obrien, DOB: 03-Oct-1950  Phone: [phone] (home)     All above HIPAA information was verified with patient.     Was a Nurse, learning disability used for this call? No    Specialty Medication(s):   Hematology/Oncology: Xtandi      Current Outpatient Medications   Medication Sig Dispense Refill    alendronate  (FOSAMAX ) 70 MG tablet TAKE ONE TABLET BY MOUTH WEEKLY 4 tablet 11    aspirin (ECOTRIN) 81 MG tablet Take 1 tablet (81 mg total) by mouth daily.      DULERA  200-5 mcg/actuation HFAA 20 mcg Two (2) times a day.      enzalutamide  (XTANDI ) 40 mg tablet Take 2 tablets (80 mg total) by mouth daily. Swallow tablets whole. Do not chew, crush or split tablets. 60 tablet 5    ezetimibe (ZETIA) 10 mg tablet Take 1 tablet (10 mg total) by mouth daily. Frequency:QD   Dosage:10   MG  Instructions:  Note:Dose: 10MG       gabapentin (NEURONTIN) 600 MG tablet Take 1 tablet (600 mg total) by mouth two (2) times a day.      hydroCHLOROthiazide  (HYDRODIURIL ) 25 MG tablet Take 1 tablet (25 mg total) by mouth daily.      HYDROcodone-acetaminophen  (NORCO) 5-325 mg per tablet Take 1 tablet by mouth.      lisinopril (PRINIVIL,ZESTRIL) 40 MG tablet Take 1 tablet (40 mg total) by mouth daily.      MYRBETRIQ  25 mg Tb24 extended-release tablet TAKE ONE TABLET BY MOUTH ONCE DAILY 30 tablet 6    omeprazole (PRILOSEC) 20 MG capsule Take 1 capsule (20 mg total) by mouth daily.      oxybutynin (DITROPAN) 5 MG tablet Take by mouth. 5 mg qAM and 15 mg qPM  0    oxyCODONE  (ROXICODONE ) 5 MG immediate release tablet 1 tablet (5 mg total)  in the morning.      rivaroxaban  (XARELTO ) 2.5 mg tablet Take 1 tablet (2.5 mg total) by mouth two (2) times a day. 60 tablet 11    sildenafiL (VIAGRA) 100 MG tablet Take 1 tablet (100 mg total) by mouth nightly as needed for erectile dysfunction. 90 tablet 3    tadalafil 20 MG tablet Take 1 tablet (20 mg total) by mouth in the morning. 90 tablet 3    tamsulosin  (FLOMAX ) 0.4 mg capsule Take 1 capsule (0.4 mg total) by mouth daily. 30 capsule 11    traMADol (ULTRAM) 50 mg tablet Take 1 tablet (50 mg total) by mouth every six (6) hours as needed for pain.       No current facility-administered medications for this visit.        Changes to medications: Camryn reports no changes at this time.    Medication list has been reviewed and updated in Epic: Yes    Allergies   Allergen Reactions    Poison Ivy Extract      Poison Ivy- Rash per patient       Changes to allergies: No    Allergies have been reviewed and updated in Epic: Yes    SPECIALTY MEDICATION ADHERENCE     Xtandi  40 mg: 2 days of medicine on hand     Medication Adherence    Patient reported X missed doses in the last month: 0  Specialty Medication: Xtandi  80 mg once daily  Patient is on additional specialty medications: No  Informant: patient  Confirmed plan for next specialty medication refill: delivery by pharmacy  Refills needed for supportive medications: not needed          Specialty medication(s) dose(s) confirmed: Regimen is correct and unchanged.     Are there any concerns with adherence? No    Adherence counseling provided? Not needed    CLINICAL MANAGEMENT AND INTERVENTION      Clinical Benefit Assessment:    Do you feel the medicine is effective or helping your condition? Yes    Clinical Benefit counseling provided? Not needed    Adverse Effects Assessment:    Are you experiencing any side effects? Yes, patient reports experiencing swelling in legs. See clinical intervention documentation: Yes    Are you experiencing difficulty administering your medicine? No    Quality of Life Assessment:    Quality of Life    Rheumatology  Oncology  1. What impact has your specialty medication had on the reduction of your daily pain or discomfort level?: None  2. On a scale of 1-10, how would you rate your ability to manage side effects associated with your specialty medication? (1=no issues, 10 = unable to take medication due to side effects): 2  Dermatology  Cystic Fibrosis          How many days over the past month did your prostate  keep you from your normal activities? For example, brushing your teeth or getting up in the morning. 0    Have you discussed this with your provider? Not needed    Acute Infection Status:    Acute infections noted within Epic:  No active infections    Patient reported infection: None    Therapy Appropriateness:    Is therapy appropriate based on current medication list, adverse reactions, adherence, clinical benefit and progress toward achieving therapeutic goals? Yes, therapy is appropriate and should be continued     Clinical Intervention:    Was an intervention completed as part of this clinical assessment? Yes  Psychologist, prison and probation services and [facility] Delivery Pharmacy - Clinical Pharmacist Intervention   Reason for Intervention Issue Observed            Additional Comments     Intervention Type of Intervention                    Recommendation provided, if applicable      Medication Medication(s) Involved     Outcome Expected Result/Outcome of Intervention      Additional Comments      Follow-up Needed?      Additional Follow-up Comments     Supporting Information Approximate Time Spent on Intervention      Clinical Evidence Used          DISEASE/MEDICATION-SPECIFIC INFORMATION      N/A    Oncology: Is the patient receiving adequate infection prevention treatment? Not applicable  Does the patient have adequate nutritional support? Not applicable    PATIENT SPECIFIC NEEDS     Does the patient have any physical, cognitive, or cultural barriers? No    Is the patient high risk? No    Does the patient require physician intervention or other additional services (i.e., nutrition, smoking cessation, social work)? No    Does the patient have an additional or emergency contact listed in their chart? Yes    SOCIAL DETERMINANTS OF HEALTH     At the [facility], we have learned that life circumstances - like trouble affording food, housing, utilities, or transportation  can affect the health of many of our patients.   That is why we wanted to ask: are you currently experiencing any life circumstances that are negatively impacting your health and/or quality of life? Patient declined to answer    Social Drivers of Health     Food Insecurity: Not on file   Tobacco Use: High Risk (05/22/2023)    Patient History     Smoking Tobacco Use: Every Day     Smokeless Tobacco Use: Never     Passive Exposure: Not on file   Transportation Needs: Not on file   Alcohol Use: Not on file   Housing: Not on file   Physical Activity: Not on file   Utilities: Not on file   Stress: Not on file   Interpersonal Safety: Not on file   Substance Use: Not on file (02/11/2023)   Intimate Partner Violence: Not on file   Social Connections: Not on file   Financial Resource Strain: Not on file   Depression: Not on file   Internet Connectivity: Not on file   Health Literacy: Not on file       Would you be willing to receive help with any of the needs that you have identified today? Not applicable       SHIPPING     Specialty Medication(s) to be Shipped:   Hematology/Oncology: Xtandi     Other medication(s) to be shipped: No additional medications requested for fill at this time     Changes to insurance: No    Cost and Payment: Patient has a $0 copay, payment information is not required.    Delivery Scheduled: Yes, Expected medication delivery date: 07/20/23.     Medication will be delivered via UPS to the confirmed prescription address in [facility].    The patient will receive a drug information handout for each medication shipped and additional FDA Medication Guides as required.  Verified that patient has previously received a Conservation officer, historic buildings and a Surveyor, mining.    The patient or caregiver noted above participated in the development of this care plan and knows that they can request review of or adjustments to the care plan at any time.      All of the patient's questions and concerns have been addressed.    Wilbern Hancock, [facility]   [facility] Specialty Pharmacist

## 2023-07-20 ENCOUNTER — Ambulatory Visit (HOSPITAL_BASED_OUTPATIENT_CLINIC_OR_DEPARTMENT_OTHER)
Admission: RE | Admit: 2023-07-20 | Discharge: 2023-07-20 | Disposition: A | Discharge status: Home / Self Care | Source: Ambulatory Visit | Attending: Vascular Surgery

## 2023-07-20 ENCOUNTER — Encounter: Payer: Self-pay | Admitting: Vascular Surgery

## 2023-07-20 ENCOUNTER — Ambulatory Visit (HOSPITAL_COMMUNITY)
Admission: RE | Admit: 2023-07-20 | Discharge: 2023-07-20 | Disposition: A | Discharge status: Home / Self Care | Source: Ambulatory Visit | Attending: Vascular Surgery | Admitting: Vascular Surgery

## 2023-07-20 ENCOUNTER — Ambulatory Visit (INDEPENDENT_AMBULATORY_CARE_PROVIDER_SITE_OTHER): Admitting: Vascular Surgery

## 2023-07-20 VITALS — BP 143/71 | HR 55 | Temp 97.2°F | Resp 18 | Ht 67.0 in | Wt 171.6 lb

## 2023-07-20 DIAGNOSIS — I739 Peripheral vascular disease, unspecified: Secondary | ICD-10-CM

## 2023-07-20 LAB — VAS US ABI WITH/WO TBI
Left ABI: 0.52
Right ABI: 1.18

## 2023-08-08 NOTE — Unmapped (Signed)
 [place] [facility] - Clinical Pharmacist Intervention   Reason for Intervention Issue Observed Side effect management          Additional Comments patient reports thickening skin on legs - advised to apply a thick cream like eucerin to dry skin at least twice daily and especially after showers.  Use lukewarm water when bathing and use mild soap.  Will contact provider for prescription for urea cream.  Also reports swelling in both legs.Discussed elevating legs with several pillows under the feet/legs when sitting or sleeping, wearing compression socks and making sure he is drinking plenty of water   Intervention Type of Intervention Education performed  Recommend OTC supportive care         Eucerin, Cerave, Cetaphil or urea based cream        Recommendation provided, if applicable      Medication Medication(s) Involved Xtabdu   Outcome Expected Result/Outcome of Intervention Enhanced patient understanding    Additional Comments      Follow-up Needed? No    Additional Follow-up Comments     Supporting Information Approximate Time Spent on Intervention 0-10 minutes    Clinical Evidence Used Professional judgement  Clinical guidelines or Drug information resource

## 2023-08-08 NOTE — Unmapped (Signed)
 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  I called the patient to inform him that he will need to pick up a topical cream/emollient from his local store.  He can use vaseline, aquaphor, etc... any topical oil based emollient.     Galena Logie, PharmD  [facility]

## 2023-08-08 NOTE — Unmapped (Signed)
 [facility] Refill Coordination Note    Specialty Medication(s) to be Shipped:   Hematology/Oncology: Xtandi    Other medication(s) to be shipped: No additional medications requested for fill at this time     Calvin Obrien, DOB: 04-28-1950  Phone: [phone] (home)       All above HIPAA information was verified with patient.     Was a Nurse, learning disability used for this call? No    Completed refill call assessment today to schedule patient's medication shipment from the [facility]  [phone]).  All relevant notes have been reviewed.     Specialty medication(s) and dose(s) confirmed: Regimen is correct and unchanged.   Changes to medications: Calvin Obrien reports no changes at this time.  Changes to insurance: No  New side effects reported not previously addressed with a pharmacist or physician: Yes - Patient reports welts and legs are itching. Patient would like to speak to the pharmacist today. Their provider is not aware.  Questions for the pharmacist: No    Confirmed patient received a Conservation officer, historic buildings and a Surveyor, mining with first shipment. The patient will receive a drug information handout for each medication shipped and additional FDA Medication Guides as required.       DISEASE/MEDICATION-SPECIFIC INFORMATION        N/A    SPECIALTY MEDICATION ADHERENCE     Medication Adherence    Patient reported X missed doses in the last month: 0  Specialty Medication: Xtandi 40 mg  Patient is on additional specialty medications: No  Informant: patient     Were doses missed due to medication being on hold? No    Xtandi 40 mg: 12 days of medicine on hand       REFERRAL TO PHARMACIST     Referral to the pharmacist: Yes - new side effects are reported: welts and legs are itching. Refills were scheduled and concern routed (high priority) to pharmacist for evaluation.      SHIPPING     Shipping address confirmed in Epic.     Cost and Payment: Patient has a $0 copay, payment information is not required.    Delivery Scheduled: Yes, Expected medication delivery date: 08/16/23.     Medication will be delivered via UPS to the prescription address in Epic [place].    Stafford Riviera M Santer Torres   [place] [facility]  Specialty Technician

## 2023-08-15 MED FILL — XTANDI 40 MG TABLET: ORAL | 30 days supply | Qty: 60 | Fill #5

## 2023-08-20 NOTE — Unmapped (Unsigned)
 [facility] VASCULAR SURGERY      Patient Name: Calvin Obrien  Encounter Date: 08/21/2023  Referring provider: Curcio  Surgeon:  Kit Peri, MD    ASSESSMENT     73 y.o. male with hx of BLE PAD with bilateral R>L foot numbness and pain and severe calf claudication that occurs < 200 feet, s/p prior LLE intervention at OSH per pt report and most recent s/p RLE diagnostic arteriogram with shockwave lithotripsy of right CFA and SFA, drug coated balloon angioplasty of R SFA, drug coated balloon angioplasty of R CFA on 05/03/2023.     Patient reports ***. Duplex demonstrates ***.     PLAN     Follow up in *** with ***. Return precautions reviewed.   Aggressive risk factor modification as below    Aggressive Risk Factor Modification     Medication management:  1.  Statin: Zetia 10 mg. Recommend high-dose statin therapy, regardless of baseline LDL, if no contraindication. If patient unable to tolerate statins or LDL persistently elevated despite statin therapy consider referral to lipidologist. ***   2.  Antiplatelet therapy: ASA 81  and xarelto 2.5 BID   3.  Hypertension: Controlled. Recommend patient work with PCP to meet goal BP <140/90     Lifestyle modification:  1.  Diet: Recommend heart healthy mediterranean type diet  2. Smoking cessation: Discussed the importance of smoking cessation. Smoking cessation program was offered to patient at today's office visit.   3. Exercise: Recommend minimum 150 minutes of brisk physical activity weekly.     Imaging, history, physical exam findings reviewed with attending who is in agreement with the above plan.    HISTORY OF PRESENT ILLNESS     Calvin Obrien is a 73 y.o. male with history of metastatic prostate cancer, colon cancer, Luetscher's syndrome, HLD, and HTN, who presents with complaints of bilateral extremity pain. Patient is s/p prior LLE vascular intervention at OSH per pt report and most recent s/p RLE diagnostic arteriogram with shockwave lithotripsy of right CFA and SFA, drug coated balloon angioplasty of R SFA, drug coated balloon angioplasty of R CFA on 05/03/2023.     Today, patient ***. He denies rest pain or wounds on lower extremities that do not heal. Current smoker (1 cpd).    Interventions to date:  - prior LLE vascular intervention per patient report at OSH   - 04/23/2023: RLE diagnostic arteriogram with shockwave lithotripsy of right CFA and SFA, drug coated balloon angioplasty of R SFA, drug coated balloon angioplasty of R CFA  \  ROS: The balance of 10 systems reviewed is negative except as noted in the HPI     RX/ ALLERGIES/ MED HX/SURG HX/ SOC HX/FAM HX: Reviewed & updated in Epic    Current Outpatient Medications   Medication Instructions    alendronate (FOSAMAX) 70 MG tablet TAKE ONE TABLET BY MOUTH WEEKLY    aspirin (ECOTRIN) 81 mg, Daily (standard)    DULERA 200-5 mcg/actuation HFAA 20 mcg, 2 times a day (standard)    ezetimibe (ZETIA) 10 mg, Daily (standard)    gabapentin (NEURONTIN) 600 mg, 2 times a day (standard)    hydroCHLOROthiazide (HYDRODIURIL) 25 mg, Daily (standard)    HYDROcodone-acetaminophen (NORCO) 5-325 mg per tablet 1 tablet    lisinopril (PRINIVIL,ZESTRIL) 40 mg, Daily (standard)    MYRBETRIQ 25 mg, Oral, Daily (standard)    omeprazole (PRILOSEC) 20 mg, Daily (standard)    oxybutynin (DITROPAN) 5 MG tablet Take by mouth. 5 mg qAM  and 15 mg qPM    oxyCODONE (ROXICODONE) 5 mg, Daily    rivaroxaban (XARELTO) 2.5 mg, Oral, 2 times a day (standard)    sildenafil (VIAGRA) 100 mg, Oral, Nightly PRN    tadalafil 20 mg, Oral, Daily    tamsulosin (FLOMAX) 0.4 mg, Oral, Daily (standard)    traMADol (ULTRAM) 50 mg, Every 6 hours PRN    XTANDI 80 mg, Oral, Daily (standard), Swallow tablets whole. Do not chew, crush or split tablets.        PHYSICAL EXAM     There were no vitals filed for this visit.    General: well appearing, no acute distress   Head: normocephalic, atraumatic  Cardiac: RRR   Lungs: Easy work of breathing, CTA Bilaterally  Abdomen: Soft, non-distended, non tender, without mass  MSK: negative joint tenderness, normal gait and ROM  Skin: intact, good turgor, sparse hair growth pattern, access site is healed, there is no hematoma   Neuro: AAO x 3, appropriate to questions  Extremities: Upper extremities warm and perfused. Bilateral lower extremities negative for edema, ulceration or digital gangrene.   Pulses: Bilateral radial pulses palpable.     DIAGNOSTICS     Images and reports reviewed independently by Dr. Wonda Hay.     BLE Arterial Duplex (08/21/2023):     Labs:  Lab Results   Component Value Date    CREATININE 1.09 06/30/2021    A1C 5.5 03/08/2017

## 2023-08-22 NOTE — Unmapped (Unsigned)
 [facility] VASCULAR SURGERY      Patient Name: Calvin Obrien  Encounter Date: 08/28/2023  Referring provider: Curcio  Surgeon:  Kit Peri, MD    ASSESSMENT     73 y.o. male with hx of BLE PAD with bilateral R>L foot numbness and pain and severe calf claudication that occurs < 200 feet, s/p prior LLE intervention at OSH per pt report and most recent s/p RLE diagnostic arteriogram with shockwave lithotripsy of right CFA and SFA, drug coated balloon angioplasty of R SFA, drug coated balloon angioplasty of R CFA on 05/03/2023.     Patient reports ***. Duplex demonstrates ***.     PLAN     Follow up in *** with ***. Return precautions reviewed.   Aggressive risk factor modification as below    Aggressive Risk Factor Modification     Medication management:  1.  Statin: Zetia 10 mg. Recommend high-dose statin therapy, regardless of baseline LDL, if no contraindication. If patient unable to tolerate statins or LDL persistently elevated despite statin therapy consider referral to lipidologist. ***   2.  Antiplatelet therapy: ASA 81  and xarelto 2.5 BID   3.  Hypertension: Controlled. Recommend patient work with PCP to meet goal BP <140/90     Lifestyle modification:  1.  Diet: Recommend heart healthy mediterranean type diet  2. Smoking cessation: Discussed the importance of smoking cessation. Smoking cessation program was offered to patient at today's office visit.   3. Exercise: Recommend minimum 150 minutes of brisk physical activity weekly.     Imaging, history, physical exam findings reviewed with attending who is in agreement with the above plan.    HISTORY OF PRESENT ILLNESS     Calvin Obrien is a 73 y.o. male with history of metastatic prostate cancer, colon cancer, Luetscher's syndrome, HLD, and HTN, who presents with complaints of bilateral extremity pain. Patient is s/p prior LLE vascular intervention at OSH per pt report and most recent s/p RLE diagnostic arteriogram with shockwave lithotripsy of right CFA and SFA, drug coated balloon angioplasty of R SFA, drug coated balloon angioplasty of R CFA on 05/03/2023.     Today, patient ***. He denies rest pain or wounds on lower extremities that do not heal. Current smoker (1 cpd).    Interventions to date:  - prior LLE vascular intervention per patient report at OSH   - 04/23/2023: RLE diagnostic arteriogram with shockwave lithotripsy of right CFA and SFA, drug coated balloon angioplasty of R SFA, drug coated balloon angioplasty of R CFA  \  ROS: The balance of 10 systems reviewed is negative except as noted in the HPI     RX/ ALLERGIES/ MED HX/SURG HX/ SOC HX/FAM HX: Reviewed & updated in Epic    Current Outpatient Medications   Medication Instructions    alendronate (FOSAMAX) 70 MG tablet TAKE ONE TABLET BY MOUTH WEEKLY    aspirin (ECOTRIN) 81 mg, Daily (standard)    DULERA 200-5 mcg/actuation HFAA 20 mcg, 2 times a day (standard)    ezetimibe (ZETIA) 10 mg, Daily (standard)    gabapentin (NEURONTIN) 600 mg, 2 times a day (standard)    hydroCHLOROthiazide (HYDRODIURIL) 25 mg, Daily (standard)    HYDROcodone-acetaminophen (NORCO) 5-325 mg per tablet 1 tablet    lisinopril (PRINIVIL,ZESTRIL) 40 mg, Daily (standard)    MYRBETRIQ 25 mg, Oral, Daily (standard)    omeprazole (PRILOSEC) 20 mg, Daily (standard)    oxybutynin (DITROPAN) 5 MG tablet Take by mouth. 5 mg qAM  and 15 mg qPM    oxyCODONE (ROXICODONE) 5 mg, Daily    rivaroxaban (XARELTO) 2.5 mg, Oral, 2 times a day (standard)    sildenafil (VIAGRA) 100 mg, Oral, Nightly PRN    tadalafil 20 mg, Oral, Daily    tamsulosin (FLOMAX) 0.4 mg, Oral, Daily (standard)    traMADol (ULTRAM) 50 mg, Every 6 hours PRN    XTANDI 80 mg, Oral, Daily (standard), Swallow tablets whole. Do not chew, crush or split tablets.        PHYSICAL EXAM     There were no vitals filed for this visit.    General: well appearing, no acute distress   Head: normocephalic, atraumatic  Cardiac: RRR   Lungs: Easy work of breathing, CTA Bilaterally  Abdomen: Soft, non-distended, non tender, without mass  MSK: negative joint tenderness, normal gait and ROM  Skin: intact, good turgor, sparse hair growth pattern, access site is healed, there is no hematoma   Neuro: AAO x 3, appropriate to questions  Extremities: Upper extremities warm and perfused. Bilateral lower extremities negative for edema, ulceration or digital gangrene.   Pulses: Bilateral radial pulses palpable.     DIAGNOSTICS     Images and reports reviewed independently by Dr. Wonda Hay.     BLE Arterial Duplex (08/28/2023):     Labs:  Lab Results   Component Value Date    CREATININE 1.09 06/30/2021    A1C 5.5 03/08/2017

## 2023-08-29 ENCOUNTER — Ambulatory Visit: Admit: 2023-08-29 | Discharge: 2023-08-29 | Payer: Medicare (Managed Care)

## 2023-08-29 ENCOUNTER — Inpatient Hospital Stay: Admit: 2023-08-29 | Discharge: 2023-08-29 | Payer: Medicare (Managed Care)

## 2023-09-07 DIAGNOSIS — C61 Malignant neoplasm of prostate: Principal | ICD-10-CM

## 2023-09-07 DIAGNOSIS — C7951 Secondary malignant neoplasm of bone: Principal | ICD-10-CM

## 2023-09-07 MED ORDER — XTANDI 40 MG TABLET
ORAL_TABLET | Freq: Every day | ORAL | 5 refills | 30.00000 days
Start: 2023-09-07 — End: ?

## 2023-09-12 DIAGNOSIS — C7951 Secondary malignant neoplasm of bone: Principal | ICD-10-CM

## 2023-09-12 DIAGNOSIS — C61 Malignant neoplasm of prostate: Principal | ICD-10-CM

## 2023-09-12 NOTE — Unmapped (Signed)
 [facility] has made a second and final attempt to reach this patient to refill the following medication:Xtandi 40mg .      We have left voicemails on the following phone numbers: [phone], have left voicemail with son at the following phone numbers: [phone], have been unable to leave messages on the following phone numbers: [phone], and have sent a text message to the following phone numbers: [phone].    Dates contacted: 6/6,11  Last scheduled delivery: 08/15/23    The patient may be at risk of non-compliance with this medication. The patient should call the [facility] at [phone]  Option 4, then Option 1: Oncology to refill medication.    [facility] Specialty Technician

## 2023-09-13 ENCOUNTER — Ambulatory Visit: Admit: 2023-09-13 | Payer: Medicare (Managed Care) | Attending: Medical Oncology | Primary: Medical Oncology

## 2023-09-13 ENCOUNTER — Ambulatory Visit: Admit: 2023-09-13 | Payer: Medicare (Managed Care) | Attending: Nurse Practitioner | Primary: Nurse Practitioner

## 2023-09-13 ENCOUNTER — Ambulatory Visit: Admit: 2023-09-13 | Payer: Medicare (Managed Care)

## 2023-09-13 MED ORDER — ENZALUTAMIDE 40 MG TABLET
ORAL_TABLET | Freq: Every day | ORAL | 5 refills | 30.00000 days | Status: CP
Start: 2023-09-13 — End: ?
  Filled 2023-09-18: qty 60, 30d supply, fill #0

## 2023-09-17 NOTE — Unmapped (Addendum)
 [facility] Delivery Pharm[facility] Refill Coordination Note    Specialty Medication(s) to be Shipped:   Hematology/Oncology: Xtandi    Other medication(s) to be shipped: No additional medications requested for fill at this time     Calvin Obrien, DOB: January 06, 1951  Phone: [phone] (home)       All above HIPAA information was verified with patient.     Was a Nurse, learning disability used for this call? No    Completed refill call assessment today to schedule patient's medication shipment from the [facility] and [facility] Delivery Pharmacy  [phone]).  All relevant notes have been reviewed.     Specialty medication(s) and dose(s) confirmed: Regimen is correct and unchanged.   Changes to medications: [name] reports no changes at this time.  Changes to insurance: No  New side effects reported not previously addressed with a pharmacist or physician: Yes - Patient reports swelling in feet. Patient would not like to speak to the pharmacist today. Their provider is not aware.  Questions for the pharmacist: No    Confirmed patient received a Conservation officer, historic buildings and a Surveyor, mining with first shipment. The patient will receive a drug information handout for each medication shipped and additional FDA Medication Guides as required.       DISEASE/MEDICATION-SPECIFIC INFORMATION        N/A    SPECIALTY MEDICATION ADHERENCE     Medication Adherence    Patient reported X missed doses in the last month: 0  Specialty Medication: Xtandi 40 mg  Patient is on additional specialty medications: No  Informant: patient              Were doses missed due to medication being on hold? No    Xtandi 40 mg: 3 days of medicine on hand        REFERRAL TO PHARMACIST     Referral to the pharmacist: Not needed      [facility]     Shipping address confirmed in Epic.     Cost and Payment: Patient has a $0 copay, payment information is not required.    Delivery Scheduled: Yes, Expected medication delivery date: 09/19/23.     Medication will be delivered via UPS to the prescription address in Epic [place].    [name]   [facility] Special[facility] and [facility] Delivery Pharm[facility]  Specialty Technician

## 2023-09-17 NOTE — Unmapped (Signed)
 This pharmacist was notified by a technician that this patient has reported they are experiencing the following side effects swelling in feet.. I have reached out to the patient and but was unable to LVM due to VM Box full.  I sent a mychart message to patient to call us  if he has any questions for the pharmacist      Approximate time spent: 0-5 minutes    Aneita DELENA Merck, [facility], Clinical Specialty Pharmacist  [facility] Special[facility] and Home Delivery Pharmacy

## 2023-09-19 NOTE — Unmapped (Signed)
 [facility] VASCULAR SURGERY      Patient Name: Calvin Obrien  Encounter Date: 09/25/2023  Referring provider: Dianne Bady  Surgeon:  Pierce Finn, MD    ASSESSMENT     73 y.o. male with hx of BLE PAD with bilateral R>L foot numbness and pain and severe calf claudication that occurs < 200 feet, s/p prior LLE intervention at OSH per pt report and most recent s/p RLE diagnostic arteriogram with shockwave lithotripsy of right CFA and SFA, drug coated balloon angioplasty of R SFA, drug coated balloon angioplasty of R CFA on 05/03/2023.     Today, patient reports that he has numbness in his right foot, but denies wounds or sores that do not heal or rest pain. Duplex demonstrates no right inflow or outflow obstruction, the left proximal CFA is patnet with dampened velocities, the distal CFA is occluded, and the proximal SFA appears to reconstitute via the PFA, with ABIs of 0.90 R and 0.48 L and great toe pressure of 162 mmHg R and 48 mmHg L. We recommend conservative management and surveillance. Patient should contact clinic for an earlier appointment if he develops a wound or a sore that does not heal.     PLAN     Follow up in 1 year with BLE arterial duplex. Return precautions reviewed. Discussed with patient that he should only continue follow up with one vascular surgeon. Patient states he would like to continue follow up with [facility].   Recommend patient elevate his legs throughout the day and walk more for edema control.   Paperwork completed for   Aggressive risk factor modification as below    Aggressive Risk Factor Modification     Medication management:  1.  Statin: Zetia 10 mg. Recommend high-dose statin therapy, regardless of baseline LDL, if no contraindication. If patient unable to tolerate statins or LDL persistently elevated despite statin therapy consider referral to lipidologist.    2.  Antiplatelet therapy: ASA 81  and xarelto  2.5 BID   3.  Hypertension: Controlled. Recommend patient work with PCP to meet goal BP <140/90     Lifestyle modification:  1.  Diet: Recommend heart healthy mediterranean type diet  2. Smoking cessation: Discussed the importance of smoking cessation. Smoking cessation program was offered to patient at today's office visit.   3. Exercise: Recommend minimum 150 minutes of brisk physical activity weekly.     Imaging, history, physical exam findings reviewed with attending who is in agreement with the above plan.    HISTORY OF PRESENT ILLNESS     Calvin Obrien is a 73 y.o. male with history of metastatic prostate cancer, colon cancer, Luetscher's syndrome, HLD, and HTN, who presents with complaints of bilateral extremity pain. Patient is s/p prior LLE vascular intervention at OSH per pt report and most recent s/p RLE diagnostic arteriogram with shockwave lithotripsy of right CFA and SFA, drug coated balloon angioplasty of R SFA, drug coated balloon angioplasty of R CFA on 05/03/2023.     Today, patient presents with his wife. Reports he did not know about seeing a different vascular surgeon. He reports that he would rather to continue follow up with [facility]. He reports not changes in his lower extremities. His right foot continues to feel numb, which he thinks is due to swelling. He reports he does not walk much. He has no swelling at the moment but he reports that his legs swell throughout the day. He denies rest pain or wounds on lower extremities  that do not heal. Current smoker (1 cpd).    Interventions to date:  - prior LLE vascular intervention per patient report at OSH   - 04/23/2023: RLE diagnostic arteriogram with shockwave lithotripsy of right CFA and SFA, drug coated balloon angioplasty of R SFA, drug coated balloon angioplasty of R CFA    ROS: The balance of 10 systems reviewed is negative except as noted in the HPI     RX/ ALLERGIES/ MED HX/SURG HX/ SOC HX/FAM HX: Reviewed & updated in Epic    Current Outpatient Medications   Medication Instructions    alendronate  (FOSAMAX ) 70 MG tablet TAKE ONE TABLET BY MOUTH WEEKLY    aspirin (ECOTRIN) 81 mg, Daily (standard)    DULERA  200-5 mcg/actuation HFAA 20 mcg, 2 times a day (standard)    ezetimibe (ZETIA) 10 mg, Daily (standard)    gabapentin (NEURONTIN) 600 mg, 2 times a day (standard)    hydroCHLOROthiazide  (HYDRODIURIL ) 25 mg, Daily (standard)    HYDROcodone-acetaminophen  (NORCO) 5-325 mg per tablet 1 tablet    lisinopril (PRINIVIL,ZESTRIL) 40 mg, Daily (standard)    MYRBETRIQ  25 mg, Oral, Daily (standard)    omeprazole (PRILOSEC) 20 mg, Daily (standard)    oxybutynin (DITROPAN) 5 MG tablet Take by mouth. 5 mg qAM and 15 mg qPM    oxyCODONE  (ROXICODONE ) 5 mg, Daily    rivaroxaban  (XARELTO ) 2.5 mg, Oral, 2 times a day (standard)    sildenafil (VIAGRA) 100 mg, Oral, Nightly PRN    tadalafil 20 mg, Oral, Daily    tamsulosin  (FLOMAX ) 0.4 mg, Oral, Daily (standard)    traMADol (ULTRAM) 50 mg, Every 6 hours PRN    XTANDI  80 mg, Oral, Daily (standard), Swallow tablets whole. Do not chew, crush or split tablets.        PHYSICAL EXAM     Vitals:    09/25/23 0925   BP: 145/58   Pulse: 53   Temp: 36.3 ??C (97.3 ??F)     General: well appearing, no acute distress   Head: normocephalic, atraumatic  Cardiac: RRR   Lungs: Easy work of breathing, CTA Bilaterally  Abdomen: Soft, non-distended, non tender, without mass  MSK: negative joint tenderness, normal gait and ROM  Skin: intact, good turgor, sparse hair growth pattern   Neuro: AAO x 3, appropriate to questions  Extremities: Upper extremities warm and perfused. Bilateral lower extremities negative for edema, ulceration or digital gangrene.   Pulses: Bilateral radial pulses palpable.     DIAGNOSTICS     Images and reports reviewed independently by Dr. Cleatus.     BLE Arterial Duplex (09/25/2023): No right inflow or outflow obstruction. The left proximal CFA is patnet with dampened velocities, the distal CFA is occluded, and the proximal SFA appears to reconstitute via the PFA. ABIs of 0.90 R and 0.48 L, with great toe pressure of 162 mmHg R and 48 mmHg L.     Labs:  Lab Results   Component Value Date    CREATININE 1.09 06/30/2021    A1C 5.5 03/08/2017       I personally spent 30 minutes of face-to-face and non-face-to-face in the care of this patient, which includes all pre, intra, and post visit time on the date of service.  All documented time was specific to the E/M visit and does not include any procedures that may have been performed.  ______________________________________________________________________    Documentation assistance was provided by Thersia Beech, Scribe, on September 25, 2023 at 9:41 AM. for Harlene Bud, PA,  and Katharine L McGinigle, MD    ---  September 25, 2023 11:47 AM. Documentation assistance provided by the Scribe. I was present during the time the encounter was recorded. The information recorded by the Scribe was done at my direction and has been reviewed and validated by me.  ----------------------------------------------------------------------------------------------------------------------

## 2023-09-21 NOTE — Unmapped (Signed)
 Patient Calvin Obrien was called in attempt number 1 to reach them regarding rescheduling their missed appointments on 09/13/23.     The call resulted in: Voicemail full    Please reschedule the patient upon their return call on 09/21/23 for the following:     Lab Appointment and Provider    Thank you,    Alexus Erika

## 2023-09-25 ENCOUNTER — Ambulatory Visit: Admit: 2023-09-25 | Discharge: 2023-09-25 | Payer: Medicare (Managed Care)

## 2023-09-25 ENCOUNTER — Inpatient Hospital Stay: Admit: 2023-09-25 | Discharge: 2023-09-25 | Payer: Medicare (Managed Care)

## 2023-09-25 DIAGNOSIS — R6889 Other general symptoms and signs: Principal | ICD-10-CM

## 2023-09-25 DIAGNOSIS — M79671 Pain in right foot: Principal | ICD-10-CM

## 2023-09-25 DIAGNOSIS — I739 Peripheral vascular disease, unspecified: Principal | ICD-10-CM

## 2023-09-25 DIAGNOSIS — M79672 Pain in left foot: Principal | ICD-10-CM

## 2023-09-26 NOTE — Unmapped (Signed)
 Patient Calvin Obrien was called in attempt number 2 to reach them regarding rescheduling their missed appointments on 09/13/23.     The call resulted in: Message left    Please reschedule the patient upon their return call on 09/26/23 for the following:     Lab Appointment and Provider- Please schedule patient for labs and visit with Dr.Basch at his next opening.    Thank you,    Alexus Erika

## 2023-10-02 NOTE — Unmapped (Signed)
 I spoke with patient Calvin Obrien to confirm appointments on the following date(s): 10/11/23 for labs at 10:45, Dr.Basch at 11:40am and injection right after.    Alexus Blackburn

## 2023-10-10 NOTE — Unmapped (Signed)
 [facility] Specialty and Home Delivery Pharmacy Refill Coordination Note    Specialty Medication(s) to be Shipped:   Hematology/Oncology: Xtandi     Other medication(s) to be shipped: No additional medications requested for fill at this time     Calvin Obrien, DOB: 1950/08/02  Phone: [phone] (home)       All above HIPAA information was verified with patient.     Was a Nurse, learning disability used for this call? No    Completed refill call assessment today to schedule patient's medication shipment from the [facility] and Home Delivery Pharmacy  [phone]).  All relevant notes have been reviewed.     Specialty medication(s) and dose(s) confirmed: Regimen is correct and unchanged.   Changes to medications: [name] reports no changes at this time.  Changes to insurance: No  New side effects reported not previously addressed with a pharmacist or physician: None reported  Questions for the pharmacist: No    Confirmed patient received a Conservation officer, historic buildings and a Surveyor, mining with first shipment. The patient will receive a drug information handout for each medication shipped and additional FDA Medication Guides as required.       DISEASE/MEDICATION-SPECIFIC INFORMATION        N/A    SPECIALTY MEDICATION ADHERENCE     Medication Adherence    Patient reported X missed doses in the last month: 0  Specialty Medication: Xtandi  40 mg  Patient is on additional specialty medications: No  Informant: patient     Were doses missed due to medication being on hold? No    Xtandi  40 mg: 12 days of medicine on hand        REFERRAL TO PHARMACIST     Referral to the pharmacist: Not needed      [facility]     Shipping address confirmed in Epic.     Cost and Payment: Patient has a $0 copay, payment information is not required.    Delivery Scheduled: Yes, Expected medication delivery date: 10/18/23.     Medication will be delivered via UPS to the prescription address in Epic [place].    [name]   [facility] Specialty Technician

## 2023-10-11 ENCOUNTER — Ambulatory Visit: Admit: 2023-10-11 | Discharge: 2023-10-11 | Payer: Medicare (Managed Care)

## 2023-10-11 ENCOUNTER — Other Ambulatory Visit: Admit: 2023-10-11 | Discharge: 2023-10-11 | Payer: Medicare (Managed Care)

## 2023-10-11 ENCOUNTER — Ambulatory Visit
Admit: 2023-10-11 | Discharge: 2023-10-11 | Payer: Medicare (Managed Care) | Attending: Medical Oncology | Primary: Medical Oncology

## 2023-10-11 DIAGNOSIS — C61 Malignant neoplasm of prostate: Principal | ICD-10-CM

## 2023-10-11 DIAGNOSIS — C7951 Secondary malignant neoplasm of bone: Principal | ICD-10-CM

## 2023-10-11 DIAGNOSIS — R21 Rash and other nonspecific skin eruption: Principal | ICD-10-CM

## 2023-10-11 LAB — CBC
HEMATOCRIT: 34.9 % — ABNORMAL LOW (ref 39.0–48.0)
HEMOGLOBIN: 11.9 g/dL — ABNORMAL LOW (ref 12.9–16.5)
MEAN CORPUSCULAR HEMOGLOBIN CONC: 34.2 g/dL (ref 32.0–36.0)
MEAN CORPUSCULAR HEMOGLOBIN: 31.1 pg (ref 25.9–32.4)
MEAN CORPUSCULAR VOLUME: 91.1 fL (ref 77.6–95.7)
MEAN PLATELET VOLUME: 8.6 fL (ref 6.8–10.7)
PLATELET COUNT: 256 10*9/L (ref 150–450)
RED BLOOD CELL COUNT: 3.83 10*12/L — ABNORMAL LOW (ref 4.26–5.60)
RED CELL DISTRIBUTION WIDTH: 13.8 % (ref 12.2–15.2)
WBC ADJUSTED: 6.8 10*9/L (ref 3.6–11.2)

## 2023-10-11 LAB — PSA: PROSTATE SPECIFIC ANTIGEN: <0.04 ng/mL (ref 0.00–4.00)

## 2023-10-11 MED ADMIN — leuprolide (LUPRON) injection 22.5 mg: 22.5 mg | INTRAMUSCULAR | @ 16:00:00 | Stop: 2023-10-11

## 2023-10-11 NOTE — Unmapped (Signed)
 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  After adding enzalutamide , PSA dropped to non-detectable, has remained undetectable.    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  He is followed in a pain clinic and has seen Palliative Care.    He is feeling very well with good energy. No change since prior visits.    Based on prior PSA values and how he is feeling, we will continue current therapy with Lupron  and enzalutamide .      Lab Results   Component Value Date    PSA <0.04 10/11/2023    PSA <0.04 06/14/2023    PSA 0.05 03/12/2023    PSA <0.04 10/19/2022    PSA <0.04 05/25/2022    PSA <0.04 02/02/2022       Plan:   - Clinical pharmacy evaluating leg rash.  - Follow up PSA q3 months  - Continue 44-month Lupron  22.5mg  (02/02/22) (I tried to change to 75-month injection but insurance refused this apparently) (10/11/23).  - Scaling, itchy rash on both lower legs, referring to Dermatology.   - Pain - has been managed by palliative care.  - Dose reduced enzalutamide  to see if improves his skin reaction (dry irritated skin on face. Legs) - two 40mg  tablets in the morning only now (previously switched to 40 mg tablets because he was mechanically unable to swallow the larger tablets).  Skin issues have resolved.  - He was previously referred to [facility] for ED assessment (and can also discuss urinary leakage).  - Follow up PSA.  - He will follow up with his pain clinic and with palliative Care here as warranted.  - He will meet about gas money today to help him out.  - Urinary leakage: He has seen Ronal Bring, NP for evaluation. He reports taking 1 pill in AM, 3 pills in PM, unclear which medication, requested that he bring medications with him to next visit.  - BMD scan was done 11/25/20 with low bone density - started Fosamax .  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  - For ED, has seen Dr. Johnetta in the past.  Viagra does not help.  He has spoken with Urology about options.    - Spine: He had prior decompression, now doing back exercises with substantial improvement of symptoms.  - Hot flashes - on Gabapentin 300 TID, Izetta Search PhD has counseled, pharmacy seeing today again. Stable  -   Code Status: Prior     I spent 45 minutes on records review, meeting with the patient, documentation and coordination on the day of service.    RTC in 3 months for next Lupron , and to check health status.      -------------------------------    Other Physicians:   Dr. Selinda Dolly [facility] Medicine)  Dr. Franky Ambrosia [facility] PM&R)  Dr. Prentice Flatten (referring; Radiation Oncology)   Dr. Norleen Kappa (Urology)   Dr. Elsie Bolls (Anesthesia pain)   Dr. Tomasita Johnetta (Urology, RE: ED)   Dr. Rolan Kindler (Pharmacy; Pain Service)    CC:  PSA relapse s/p definitive RT (2009) for Gleason 3+4=7 prostate adenocarcinoma possibly to bone.     Current therapy: Lupron     Oncology history:   - Diagnosed 2008 with prostate cancer, PSA 13.5.   - Prostate biopsy on 04/02/07 w Gleason 3+4=7   - 2009 Radiation therapy to w Dr. Millard [facility] 78 Gy.   - 2012-13: Rising PSA   - 11/13: Saw [place] rad onc, felt not candidate for salvage RT   - 2/14: BS c/w 2009 with suspicious increased uptake at T5, L inferior iliac bone, subtle at L1.   - CT A/P shows sclerosis at L4-5 equivocal for DJD vs metastases, I discussed w radiology, felt most likely metastatic.   - 3/14: Lupron  started   - 12/14: Lupron  held for intermittent approach  - 11/13/13: Restarted Lupron  due to PSA rise with worsening back pain  - 8/15: Palliative RT to spine in [place], [place].  - 05/25/14:  MRI spine: Multilevel degenerative changes, most prominent at L3-4 and L4-L5, where there is marked spinal canal stenosis and crowding with potential indentation of the nerve roots. Severe bilateral neural foraminal narrowing at L4-5 and moderate to marked neuroforaminal narrowing at L3-4 on the right. Additional multilevel degenerative changes as outlined above.    - 08/15/15: Abnormal focal marrow signal and enhancement at the endplates surrounding the L4-5 disc space are favored to represent acutely inflamed Schmorl's nodes. Metastatic disease is felt less likely but is not entirely excluded, particularly given enlargement of the lesion at the L5 level. Continued follow-up recommended.  - 6/17: Bone mineral density: The femoral neck density is mildly low, but the spine and total femoral densities are normal.  - 10/17: L2-S1 decompression surgery w Dr. Rolan Clap, postoperative alcohol withdrawal and patient subsequently states he quit drinking.  - 12/17: L ankle pain, negative xray.  - 04/20/18: CT A/P: Stable heterogeneity of the left iliac wing representing known metastatic disease. No new evidence of metastatic disease within the abdomen/pelvis.  - 04/25/18: Bone scan: Increased radiotracer activity within the left iliac bone concerning for osseous metastases.  - 08/08/18: Castration resistant by PSA - start process for enzalutamide   -06/14/23: PSA remains undetectable    ROS: Chronic ongoing fatigue which is worse on Lupron  but stable since last visit.  Ongoing back pain w L sciatica.  Hot flashes persist on Lupron , daily.  Nocturia/chronic dysuria: much better with Ditropan 0-1x/night.  No pain.  Persistent ED.  Remainder of 10 system ROS otherwise negative.    Physical exam:   BP 126/54  - Pulse 56  - Temp 36.8 ??C (98.2 ??F) (Oral)  - Resp 16  - Ht 167.6 cm (5' 6)  - Wt 77 kg (169 lb 11.2 oz)  - SpO2 100%  - BMI 27.39 kg/m??   NAD  A+Ox3  No visible rash  No dyspnea or cough  No apparent neurological deficit    Current Outpatient Medications   Medication Sig Dispense Refill    aspirin (ECOTRIN) 81 MG tablet Take 1 tablet (81 mg total) by mouth daily.      enzalutamide  (XTANDI ) 40 mg tablet Take 2 tablets (80 mg total) by mouth daily. Swallow tablets whole. Do not chew, crush or split tablets. 60 tablet 5    ezetimibe (ZETIA) 10 mg tablet Take 1 tablet (10 mg total) by mouth daily. Frequency:QD   Dosage:10   MG  Instructions:  Note:Dose: 10MG       gabapentin (NEURONTIN) 600 MG tablet Take 1 tablet (  600 mg total) by mouth two (2) times a day.      hydroCHLOROthiazide  (HYDRODIURIL ) 25 MG tablet Take 1 tablet (25 mg total) by mouth daily.      HYDROcodone-acetaminophen  (NORCO) 5-325 mg per tablet Take 1 tablet by mouth.      lisinopril (PRINIVIL,ZESTRIL) 40 MG tablet Take 1 tablet (40 mg total) by mouth daily.      MYRBETRIQ  25 mg Tb24 extended-release tablet TAKE ONE TABLET BY MOUTH ONCE DAILY 30 tablet 6    omeprazole (PRILOSEC) 20 MG capsule Take 1 capsule (20 mg total) by mouth daily.      oxybutynin (DITROPAN) 5 MG tablet Take by mouth. 5 mg qAM and 15 mg qPM  0    oxyCODONE  (ROXICODONE ) 5 MG immediate release tablet 1 tablet (5 mg total)  in the morning.      tamsulosin  (FLOMAX ) 0.4 mg capsule Take 1 capsule (0.4 mg total) by mouth daily. 30 capsule 11    traMADol (ULTRAM) 50 mg tablet Take 1 tablet (50 mg total) by mouth every six (6) hours as needed for pain.      alendronate  (FOSAMAX ) 70 MG tablet TAKE ONE TABLET BY MOUTH WEEKLY (Patient not taking: Reported on 10/11/2023) 4 tablet 11    DULERA  200-5 mcg/actuation HFAA 20 mcg Two (2) times a day. (Patient not taking: Reported on 10/11/2023)      rivaroxaban  (XARELTO ) 2.5 mg tablet Take 1 tablet (2.5 mg total) by mouth two (2) times a day. (Patient not taking: Reported on 10/11/2023) 60 tablet 11    sildenafiL (VIAGRA) 100 MG tablet Take 1 tablet (100 mg total) by mouth nightly as needed for erectile dysfunction. (Patient not taking: Reported on 10/11/2023) 90 tablet 3    tadalafil 20 MG tablet Take 1 tablet (20 mg total) by mouth in the morning. (Patient not taking: Reported on 10/11/2023) 90 tablet 3     No current facility-administered medications for this visit.       Allergies   Allergen Reactions    Poison Ivy Extract      Poison Ivy- Rash per patient       Laboratory:   06/27/12: PSA=3 (on Casodex), testosterone >800     Lab Results   Component Value Date    PSA Diagnostic 0.6 05/14/2014    PSA Diagnostic 0.5 02/05/2014    PSA Diagnostic 6.9 (H) 11/06/2013    PSA Diagnostic 2.4 07/24/2013    PSA Diagnostic 0.3 03/20/2013    PSA Diagnostic 0.2 12/26/2012       Lab on 10/11/2023   Component Date Value Ref Range Status    PSA 10/11/2023 <0.04  0.00 - 4.00 ng/mL Final    WBC 10/11/2023 6.8  3.6 - 11.2 10*9/L Final    RBC 10/11/2023 3.83 (L)  4.26 - 5.60 10*12/L Final    HGB 10/11/2023 11.9 (L)  12.9 - 16.5 g/dL Final    HCT 92/89/7974 34.9 (L)  39.0 - 48.0 % Final    MCV 10/11/2023 91.1  77.6 - 95.7 fL Final    MCH 10/11/2023 31.1  25.9 - 32.4 pg Final    MCHC 10/11/2023 34.2  32.0 - 36.0 g/dL Final    RDW 92/89/7974 13.8  12.2 - 15.2 % Final    MPV 10/11/2023 8.6  6.8 - 10.7 fL Final    Platelet 10/11/2023 256  150 - 450 10*9/L Final     Lab Results   Component Value Date    PSA <0.04 10/11/2023  PSA <0.04 06/14/2023    PSA 0.05 03/12/2023    PSA <0.04 10/19/2022    PSA <0.04 05/25/2022    PSA <0.04 02/02/2022     3/17: Thyroid function WNL.

## 2023-10-11 NOTE — Unmapped (Signed)
 Pt received lupron 22.5mg  injection in Left upper outer dorsoGluteus. Tolerated it well. Applied guaze and band aid.

## 2023-10-11 NOTE — Unmapped (Deleted)
 3 months scaly rashon both legs'

## 2023-10-12 NOTE — Unmapped (Signed)
 Derm referral to [facility] sent for patient for rash. Patient informed and I ask him to please make an appointment when he is contacted by the office.

## 2023-10-17 MED FILL — XTANDI 40 MG TABLET: ORAL | 30 days supply | Qty: 60 | Fill #1

## 2023-11-09 NOTE — Unmapped (Signed)
 [facility] Specialty and Home Delivery Pharmacy Refill Coordination Note    Specialty Medication(s) to be Shipped:   Hematology/Oncology: Xtandi     Other medication(s) to be shipped: No additional medications requested for fill at this time     Calvin Obrien, DOB: March 10, 1951  Phone: [phone] (home)       All above HIPAA information was verified with patient.     Was a Nurse, learning disability used for this call? No    Completed refill call assessment today to schedule patient's medication shipment from the [facility] and [facility] Delivery Pharm[facility]  [phone]).  All relevant notes have been reviewed.     Specialty medication(s) and dose(s) confirmed: Regimen is correct and unchanged.   Changes to medications: Calvin Obrien reports no changes at this time.  Changes to insurance: No  New side effects reported not previously addressed with a pharmacist or physician: None reported  Questions for the pharmacist: No    Confirmed patient received a Conservation officer, historic buildings and a Surveyor, mining with first shipment. The patient will receive a drug information handout for each medication shipped and additional FDA Medication Guides as required.       DISEASE/MEDICATION-SPECIFIC INFORMATION        N/A    SPECIALTY MEDICATION ADHERENCE     Medication Adherence    Patient reported X missed doses in the last month: 0  Specialty Medication: Xtandi  40 mg  Patient is on additional specialty medications: No  Informant: patient     Were doses missed due to medication being on hold? No    Xtandi  40 mg: 10 days of medicine on hand        REFERRAL TO PHARMACIST     Referral to the pharmacist: Not needed      [facility]     Shipping address confirmed in Epic.     Cost and Payment: Patient has a $0 copay, payment information is not required.    Delivery Scheduled: Yes, Expected medication delivery date: 11/15/23.     Medication will be delivered via UPS to the prescription address in Epic [place].    Ammie Warrick M Santer Torres    Specialty and Home Delivery Pharmacy Specialty Technician

## 2023-11-14 MED FILL — XTANDI 40 MG TABLET: ORAL | 30 days supply | Qty: 60 | Fill #2

## 2023-12-10 MED ORDER — ALENDRONATE 70 MG TABLET
ORAL_TABLET | 11 refills | 0.00000 days
Start: 2023-12-10 — End: ?

## 2023-12-10 NOTE — Unmapped (Signed)
 Please refill if appropriate.     Most recent clinic visit: 10/11/2023  Next clinic visit: 01/17/2024

## 2023-12-13 NOTE — Unmapped (Signed)
 The [facility] has made a second and final attempt to reach this patient to refill the following medication:Xtandi .      We have left voicemails on the following phone numbers: [phone], have been unable to leave messages on the following phone numbers: [phone], and have sent a text message to the following phone numbers: [phone].    Dates contacted: 9/5,11  Last scheduled delivery: 11/14/23    The patient may be at risk of non-compliance with this medication. The patient should call the [facility] at [phone]  Option 4, then Option 1: Oncology to refill medication.    Maryla CHRISTELLA Koleen Irven [facility] Specialty and Home Delivery Pharmacy Specialty Technician

## 2023-12-17 MED ORDER — ALENDRONATE 70 MG TABLET
ORAL_TABLET | 11 refills | 0.00000 days | Status: CP
Start: 2023-12-17 — End: ?

## 2023-12-18 DIAGNOSIS — M5416 Radiculopathy, lumbar region: Principal | ICD-10-CM

## 2023-12-18 NOTE — Unmapped (Signed)
 The patient was called and said he is having leg swelling and he needs to see someone.  Asked if he has seen his PCP.  He said she told him to call us .  On chart review the patient had a PVL duplex that Curcio, PA ordered 09/25/23.  Asked to call her back for guidance.  Informed that he saw Dr Janith in 2022.  If his doctor thinks he needs to come here for his back, then she needs to make a referral to the [facility].  Otherwise stay in contact with his PCP  He said he will call her and has the number.[email or web address]

## 2023-12-18 NOTE — Unmapped (Signed)
 [facility] Specialty and Home Delivery Pharmacy Refill Coordination Note    Specialty Medication(s) to be Shipped:   Hematology/Oncology: Xtandi     Other medication(s) to be shipped: No additional medications requested for fill at this time    Specialty Medications not needed at this time: N/A     Calvin Obrien, DOB: 23-Oct-1950  Phone: [phone] (home)       All above HIPAA information was verified with patient.     Was a Nurse, learning disability used for this call? No    Completed refill call assessment today to schedule patient's medication shipment from the [facility] Delivery [facility]  [phone]).  All relevant notes have been reviewed.     Specialty medication(s) and dose(s) confirmed: Regimen is correct and unchanged.   Changes to medications: Cadin reports no changes at this time.  Changes to insurance: No  New side effects reported not previously addressed with a pharmacist or physician: None reported  Questions for the pharmacist: No    Confirmed patient received a Conservation officer, historic buildings and a Surveyor, mining with first shipment. The patient will receive a drug information handout for each medication shipped and additional FDA Medication Guides as required.       DISEASE/MEDICATION-SPECIFIC INFORMATION        N/A    SPECIALTY MEDICATION ADHERENCE     Medication Adherence    Patient reported X missed doses in the last month: 0  Specialty Medication: Xtandi  40 mg  Patient is on additional specialty medications: No  Informant: patient              Were doses missed due to medication being on hold? No    XTANDI  40 mg tablet (enzalutamide ): 2 days of medicine on hand       REFERRAL TO PHARMACIST     Referral to the pharmacist: Not needed      [facility]     Shipping address confirmed in Epic.     Cost and Payment: Patient has a $0 copay, payment information is not required.    Delivery Scheduled: Yes, Expected medication delivery date: 12/20/23.     Medication will be delivered via UPS to the prescription address in Epic [place].    Brit Carbonell   [facility] Specialty and Home Delivery Pharmacy  Specialty Technician

## 2023-12-19 MED FILL — XTANDI 40 MG TABLET: ORAL | 30 days supply | Qty: 60 | Fill #3

## 2024-01-16 ENCOUNTER — Ambulatory Visit: Admit: 2024-01-16 | Discharge: 2024-01-17 | Payer: Medicare (Managed Care)

## 2024-01-16 DIAGNOSIS — M5442 Lumbago with sciatica, left side: Principal | ICD-10-CM

## 2024-01-16 DIAGNOSIS — M5441 Lumbago with sciatica, right side: Principal | ICD-10-CM

## 2024-01-16 DIAGNOSIS — R29818 Other symptoms and signs involving the nervous system: Principal | ICD-10-CM

## 2024-01-16 DIAGNOSIS — G8929 Other chronic pain: Principal | ICD-10-CM

## 2024-01-16 NOTE — Unmapped (Signed)
 ORTHOPAEDIC SPINE CLINIC NOTE       Tannar Broker A Yazir Koerber, FNP  [email or web address]  [phone]        Patient Name:Calvin Obrien  MRN: [id number]  DOB: 03-15-1951    Date: 01/16/2024    PCP: Katrinka Karna SAILOR, MD    ASSESSMENT:     1. Chronic bilateral low back pain with bilateral sciatica    2. Neurogenic claudication        Calvin Obrien is a 73 y.o. male with chronic low back pain and neurogenic claudication. RLE weakness and bilateral foot numbness.          PLAN and RECOMMENDATIONS:     Options reviewed with patient.  Recommended Physical Therapy.  Recommend Lumbar AP, Lateral and [employer] today. Recommend MRI Lumbar Spine iso neurogenic claudication and concern for lumbar stenosis.    Future Considerations:  - surgical evaluation  - Referral to pain management    Patient Instructions   Medications: Take medications as precribed by your Medical Team  Activity: Activities as tolerated.  Conservative Care: We referred you to Physical Therapy.  Surgical Care: No surgical indications at this time.  Imaging studies: MRI of the Lumbosacral spine was ordered. Medical necessity: Radiographs have been performed/ordered. 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  Lumbar AP, lateral, and Ferguson views (today on the way out).  Labs: None  Return in about 6 weeks (around 02/27/2024).     Contact our nursing team via MyChart or telephone with any clinical questions/concerns.  Our scheduling team and support staff can be reached at [phone].     Orders Placed This Encounter   Procedures    MRI Lumbar Spine Wo Contrast    XR Lumbar Spine AP, Lateral, Ferguson    Ambulatory referral to Physical Therapy          SUBJECTIVE:     Chief Complaint:  Chief Complaint   Patient presents with    Back Pain       History of Present Illness:        01/16/24 1007   PainSc: 8        Calvin Obrien is a 73 y.o. 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  Date of onset: since 2022.     History of Present Illness  Calvin Obrien is a 73 year old male who presents with worsening back pain and radicular pain down BLE, worse on RLE.      He has experienced worsening back pain since his last visit in 2022. Last seen by Dr. Janith in 03/2021 and physical therapy was recommended.  The pain is exacerbated by standing for prolonged periods and is relieved by sitting or lying down. He uses a motorized cart for mobility in stores and notes that leaning over helps alleviate the pain.     He experiences pain radiating down both legs to his feet, accompanied by swelling and numbness. He reports weakness in his legs but no issues with his hands. Reports RLE is weaker than LLE. No recent falls have occurred.    He currently takes tramadol for pain management and has previously been prescribed oxycodone , which he found effective. He does not take Tylenol  or  Motrin. He mentions that prior injections provided relief for only a few days.      Current Treatments Previous Treatments   Current Relevant Pain Medications:  Tramadol    Current Physical Therapy: No recent PT    Adjunct Treatments: Heat/Ice    In a Pain Clinic: No  - [facility] (Dr. Joycelyn)  - Saw pain psychology (04/2016) Prior Relevant Pain Medications:  - Oxycodone , Hydrocodone  - Gabapentin  Prior Physical Therapy: Yes. Last in 2022    Injections:Yes.  -TFESI in 2011 with Dr. Marvelyn (not helpful)      Prior Relevant Surgeries: Yes.  Spinal cord stimulatory trial in 2009 with Dr. Marvelyn           Medical History   He  has a past medical history of Acid reflux, Hyperlipemia, Hypertension, and Prostate cancer    (CMS-HCC).     Surgical History   He  has a past surgical history that includes pr laminec/facetect/foramin,lumbar 1 seg (N/A, 01/31/2016); pr lam facetectomy&foramot 1 vrt sgm ea addl sgm (N/A, 01/31/2016); pr insert,inflatable penile prosthesis (N/A, 03/16/2017); and pr remvl,inflat penile prosth w/o replacmt (N/A, 04/20/2017).     Allergies   Poison ivy extract   Medications   He has a current medication list which includes the following prescription(s): alendronate , aspirin, enzalutamide , ezetimibe, gabapentin, hydrochlorothiazide , hydrocodone-acetaminophen , lisinopril, myrbetriq , omeprazole, oxybutynin, oxycodone , tamsulosin , tramadol, dulera , rivaroxaban , sildenafil, and tadalafil.   Review of Systems A 10-system review was performed by questionnaire and noted in the electronic chart.  Positives noted/discussed.  Balance of systems was negative.    Fever/chills: denies  Recent unexplained weight loss: denies  Bowel/bladder symptoms: denies   Family History His family history includes Hypertension in his father and mother.     Social History He  reports that he has been smoking cigarettes. He started smoking about 50 years ago. He has a 10.1 pack-year smoking history. He has never used smokeless tobacco. He reports current alcohol use of about 4.0 standard drinks of alcohol per week. He reports current drug use. Drug: Marijuana.        Occupational History    Not on file          OBJECTIVE:     PHYSICAL EXAM:  Vitals: Temp 36.6 ??C (97.9 ??F)  - Ht 167.6 cm (5' 5.98)  - Wt 77.2 kg (170 lb 4.8 oz)  - BMI 27.50 kg/m??   Appearance: well-nourished and no acute distress   Skin: No cyanosis or clubbing in bilat hands. and No edema in the feet.  Pulses: 2+ DP pulses bilaterally     Neurologic exam:   Mental Status: Calvin Obrien is oriented to time, place, and person & is alert and cooperative  Motor: Normal bulk and tone without evidence of pronator drift or fasciculations. No abnormal movements.  Gait: stooped gait. Cerebellum/Coordination: Romberg is positive.    Motor R L  Reflexes R L   IP 4 5  Patellar 1-2+ 1-2+   Quad 4 5  Achilles 1-2+ 1-2+       Pathologic R L   TA 4 5  Hoffmann's neg. neg.   EHL 4 5  Clonus neg. neg.   GS 4 5  Babinski neg. neg.     Sensory:   Lumbar Sensory to Light Touch Right  Left    Anterior thigh (L2) Normal Normal   Posterior thigh (S2) Normal Normal   Medial thigh (L3) Normal  Normal   Medial calf (L4) Normal Normal   Dorsal foot (L5) Diminished Diminished   Lateral foot (S1) Diminished Diminished         Thoracolumbar Spine Exam:  Inspection: No swelling, erythema, deformity, atrophy or hypertrophy noted  Lumbar Spine ROM: mildly restricted  Spine Palpation: non-tender to palpation, normal skin    Facet Loading Test: Positive Bilaterally  Nerve Tension Signs: Lhermitte's negative, Slump test positive right, and Slump test positive left    Sacroiliac Joint:   Inspection: Normal alignment. No erythema, discoloration, or asymmetry.  Fortin Finger's Test: Deferred  Palpation: No tenderness at PSIS    Right Hip/Knee:   No pain with hip internal rotation/loading. Normal internal rotation ROM.   No knee pain with flexion/extension. No atrophy noted upon inspection. Normal skin. Normal ROM and stability.  Left Hip/Knee:   No pain with hip internal rotation/loading. Normal internal rotation ROM.   No knee pain with flexion/extension. No atrophy noted upon inspection. Normal skin. Normal ROM and stability.         MEDICAL DECISION MAKING    Test Results:  Imaging:   CT Abdomen & Pelvis. [facility] Date:05/2023.  Impression: Moderate Degenerative changes.       The available reports were reviewed.    Labs:  Lab Results   Component Value Date    A1C 5.5 03/08/2017       Discussion:  Clinical findings, diagnostic/treatment options, and plan were discussed with the patient.  Activities - Advised activities as tolerated, using pain as a guide.  Imaging - Discussed pros/cons of advanced imaging options.      E&M Coding:    MEDICAL DECISION MAKING (level of service defined by 2/3 elements)     Number/Complexity of Problems Addressed 1 or more chronic illnesses with exacerbation, progression, or side effects of treatment (99204/99214)   Amount/Complexity of Data to be Reviewed/Analyzed 2 points: Review prior notes (1 point per unique source); Review test results (1 point per unique test); Order tests (1 point per unique test) (99203/99213)   Risk of Complications/Morbidity/Mortality of Management Over-the-counter Medications (99203/99213)  Physical Therapy/Occupational Therapy (99203/99213)     Or TIME     Total Time for E/M Services on the Date of Encounter N/A          cc: Katrinka Karna SAILOR, MD, Katrinka Karna SAILOR, MD

## 2024-01-16 NOTE — Unmapped (Signed)
 Medications: Take medications as precribed by your Medical Team  Activity: Activities as tolerated.  Conservative Care: We referred you to Physical Therapy.  Surgical Care: No surgical indications at this time.  Imaging studies: MRI of the Lumbosacral spine was ordered. Medical necessity: Radiographs have been performed/ordered. 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  Lumbar AP, lateral, and Ferguson views (today on the way out).  Labs: None  Return in about 6 weeks (around 02/27/2024).     Contact our nursing team via MyChart or telephone with any clinical questions/concerns.  Our scheduling team and support staff can be reached at [phone].

## 2024-01-17 ENCOUNTER — Ambulatory Visit: Admit: 2024-01-17 | Discharge: 2024-01-17 | Payer: Medicare (Managed Care)

## 2024-01-17 ENCOUNTER — Ambulatory Visit
Admit: 2024-01-17 | Discharge: 2024-01-17 | Payer: Medicare (Managed Care) | Attending: Medical Oncology | Primary: Medical Oncology

## 2024-01-17 ENCOUNTER — Other Ambulatory Visit: Admit: 2024-01-17 | Discharge: 2024-01-17 | Payer: Medicare (Managed Care)

## 2024-01-17 DIAGNOSIS — C7951 Secondary malignant neoplasm of bone: Principal | ICD-10-CM

## 2024-01-17 DIAGNOSIS — C61 Malignant neoplasm of prostate: Principal | ICD-10-CM

## 2024-01-17 LAB — CBC W/ AUTO DIFF
BASOPHILS ABSOLUTE COUNT: 0.1 10*9/L (ref 0.0–0.1)
BASOPHILS RELATIVE PERCENT: 0.7 %
EOSINOPHILS ABSOLUTE COUNT: 0.3 10*9/L (ref 0.0–0.5)
EOSINOPHILS RELATIVE PERCENT: 3.5 %
HEMATOCRIT: 35.8 % — ABNORMAL LOW (ref 39.0–48.0)
HEMOGLOBIN: 12.2 g/dL — ABNORMAL LOW (ref 12.9–16.5)
LYMPHOCYTES ABSOLUTE COUNT: 1.8 10*9/L (ref 1.1–3.6)
LYMPHOCYTES RELATIVE PERCENT: 21.7 %
MEAN CORPUSCULAR HEMOGLOBIN CONC: 34 g/dL (ref 32.0–36.0)
MEAN CORPUSCULAR HEMOGLOBIN: 30.8 pg (ref 25.9–32.4)
MEAN CORPUSCULAR VOLUME: 90.7 fL (ref 77.6–95.7)
MEAN PLATELET VOLUME: 8.8 fL (ref 6.8–10.7)
MONOCYTES ABSOLUTE COUNT: 0.6 10*9/L (ref 0.3–0.8)
MONOCYTES RELATIVE PERCENT: 7 %
NEUTROPHILS ABSOLUTE COUNT: 5.6 10*9/L (ref 1.8–7.8)
NEUTROPHILS RELATIVE PERCENT: 67.1 %
PLATELET COUNT: 240 10*9/L (ref 150–450)
RED BLOOD CELL COUNT: 3.95 10*12/L — ABNORMAL LOW (ref 4.26–5.60)
RED CELL DISTRIBUTION WIDTH: 13.5 % (ref 12.2–15.2)
WBC ADJUSTED: 8.4 10*9/L (ref 3.6–11.2)

## 2024-01-17 LAB — PSA: PROSTATE SPECIFIC ANTIGEN: <0.04 ng/mL (ref 0.00–4.00)

## 2024-01-17 MED ADMIN — leuprolide (LUPRON) injection 22.5 mg: 22.5 mg | INTRAMUSCULAR | @ 17:00:00 | Stop: 2024-01-17

## 2024-01-17 NOTE — Unmapped (Signed)
 Pt tolerated L:upronb  injection without difficulty. [facility] left [facility] ambulatory,steady gait, NAD, no questions, complaints, nor concerns voiced at d/c.

## 2024-01-17 NOTE — Unmapped (Signed)
 MEDICAL ONCOLOGY FOLLOW UP VISIT      Impression:     [name] is a 73 y.o. 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  After adding enzalutamide , PSA dropped to non-detectable, has remained undetectable.    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  He is followed in a pain clinic and has seen Palliative Care.    He is feeling very well with good energy. No change since prior visits.    Based on prior PSA values and how he is feeling, we will continue current therapy with Lupron  and enzalutamide .      Lab Results   Component Value Date    PSA <0.04 01/17/2024    PSA <0.04 10/11/2023    PSA <0.04 06/14/2023    PSA 0.05 03/12/2023    PSA <0.04 10/19/2022    PSA <0.04 05/25/2022       Plan:   - Continue 69-month Lupron  22.5mg  (I tried to change to 50-month injection but insurance refused this apparently).  - Dose reduced enzalutamide  to see if improves his skin reaction (dry irritated skin on face. Legs) - two 40mg  tablets in the morning only now (previously switched to 40 mg tablets because he was mechanically unable to swallow the larger tablets).  Skin issues have resolved.  - Scaling, itchy rash on both lower legs, referred previously to Dermatology in July 2025, and they sem never to have called him back, we will call Dermatology again to try and set this up.  - Pain - has been managed by palliative care.  - He was previously referred to [facility] for ED assessment (and can also discuss urinary leakage).  - Follow up PSA.  - He will follow up with his pain clinic and with palliative Care here as warranted.  - Urinary leakage: He has seen Ronal Bring, NP for evaluation. He reports taking 1 pill in AM, 3 pills in PM, unclear which medication, requested that he bring medications with him to next visit.  - BMD scan was done 11/25/20 with low bone density - started Fosamax .  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  - For ED, has seen Dr. Johnetta in the past.  Viagra does not help.  He has spoken with Urology about options.    - Spine: He had prior decompression, now doing back exercises with substantial improvement of symptoms.  - Hot flashes - on Gabapentin 300 TID, Izetta Search PhD has counseled, pharmacy seeing today again. Stable  -   Code Status: Prior     I spent 31 minutes on records review, meeting with the patient, documentation and coordination on the day of service.    RTC in 3 months for next Lupron , and to check health status.      -------------------------------    Other Physicians:   Dr. Selinda Dolly [facility])  Dr. Franky Ambrosia [facility] PM&R)  Dr. Prentice Flatten (referring; Radiation Oncology)   Dr. Norleen Kappa (Urology)   Dr. Elsie Bolls (Anesthesia pain)   Dr. Tomasita Johnetta (Urology, RE: ED)   Dr. 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     Current therapy: Lupron     Oncology history:   - Diagnosed 2008 with prostate cancer, PSA 13.5.   - Prostate biopsy on 04/02/07 w Gleason 3+4=7   - 2009 Radiation therapy to w Dr. Millard [facility] 78 Gy.   - 2012-13: Rising PSA   - 11/13: Saw [facility] rad onc, felt not candidate for salvage RT   - 2/14: BS c/w 2009 with suspicious increased uptake at T5, L inferior iliac bone, subtle at L1.   - CT A/P shows sclerosis at L4-5 equivocal for DJD vs metastases, I discussed w radiology, felt most likely metastatic.   - 3/14: Lupron  started   - 12/14: Lupron  held for intermittent approach  - 11/13/13: Restarted Lupron  due to PSA rise with worsening back pain  - 8/15: Palliative RT to spine in [place], [place].  - 05/25/14:  MRI spine: Multilevel degenerative changes, most prominent at L3-4 and L4-L5, where there is marked spinal canal stenosis and crowding with potential indentation of the nerve roots. Severe bilateral neural foraminal narrowing at L4-5 and moderate to marked neuroforaminal narrowing at L3-4 on the right. Additional multilevel degenerative changes as outlined above.    - 08/15/15: Abnormal focal marrow signal and enhancement at the endplates surrounding the L4-5 disc space are favored to represent acutely inflamed Schmorl's nodes. Metastatic disease is felt less likely but is not entirely excluded, particularly given enlargement of the lesion at the L5 level. Continued follow-up recommended.  - 6/17: Bone mineral density: The femoral neck density is mildly low, but the spine and total femoral densities are normal.  - 10/17: L2-S1 decompression surgery w Dr. Rolan Clap, postoperative alcohol withdrawal and patient subsequently states he quit drinking.  - 12/17: L ankle pain, negative xray.  - 04/20/18: CT A/P: Stable heterogeneity of the left iliac wing representing known metastatic disease. No new evidence of metastatic disease within the abdomen/pelvis.  - 04/25/18: Bone scan: Increased radiotracer activity within the left iliac bone concerning for osseous metastases.  - 08/08/18: Castration resistant by PSA - start process for enzalutamide   -06/14/23: PSA remains undetectable    ROS: Chronic ongoing fatigue which is worse on Lupron  but stable since last visit.  Ongoing back pain w L sciatica.  Hot flashes persist on Lupron , daily.  Nocturia/chronic dysuria: much better with Ditropan 0-1x/night.  No pain.  Persistent ED.  Remainder of 10 system ROS otherwise negative.    Physical exam:   There were no vitals taken for this visit.  NAD  A+Ox3  No visible rash  No dyspnea or cough  No apparent neurological deficit    Current Outpatient Medications   Medication Sig Dispense Refill    alendronate  (FOSAMAX ) 70 MG tablet TAKE ONE TABLET BY MOUTH WEEKLY 4 tablet 11    aspirin (ECOTRIN) 81 MG tablet Take 1 tablet (81 mg total) by mouth daily.      DULERA  200-5 mcg/actuation HFAA 20 mcg Two (2) times a day. (Patient not taking: Reported on 01/16/2024)      enzalutamide  (XTANDI ) 40 mg tablet Take 2 tablets (80 mg total) by mouth daily. Swallow tablets whole. Do not chew, crush or split tablets. 60 tablet 5    ezetimibe (ZETIA) 10 mg tablet Take 1 tablet (10 mg total) by mouth daily. Frequency:QD   Dosage:10   MG  Instructions:  Note:Dose: 10MG       gabapentin (NEURONTIN) 600 MG tablet Take 1 tablet (600 mg total) by  mouth two (2) times a day.      hydroCHLOROthiazide  (HYDRODIURIL ) 25 MG tablet Take 1 tablet (25 mg total) by mouth daily.      HYDROcodone-acetaminophen  (NORCO) 5-325 mg per tablet Take 1 tablet by mouth.      lisinopril (PRINIVIL,ZESTRIL) 40 MG tablet Take 1 tablet (40 mg total) by mouth daily.      MYRBETRIQ  25 mg Tb24 extended-release tablet TAKE ONE TABLET BY MOUTH ONCE DAILY 30 tablet 6    omeprazole (PRILOSEC) 20 MG capsule Take 1 capsule (20 mg total) by mouth daily.      oxybutynin (DITROPAN) 5 MG tablet Take by mouth. 5 mg qAM and 15 mg qPM  0    oxyCODONE  (ROXICODONE ) 5 MG immediate release tablet 1 tablet (5 mg total)  in the morning.      rivaroxaban  (XARELTO ) 2.5 mg tablet Take 1 tablet (2.5 mg total) by mouth two (2) times a day. (Patient not taking: Reported on 01/16/2024) 60 tablet 11    sildenafiL (VIAGRA) 100 MG tablet Take 1 tablet (100 mg total) by mouth nightly as needed for erectile dysfunction. (Patient not taking: Reported on 01/16/2024) 90 tablet 3    tadalafil 20 MG tablet Take 1 tablet (20 mg total) by mouth in the morning. (Patient not taking: Reported on 01/16/2024) 90 tablet 3    tamsulosin  (FLOMAX ) 0.4 mg capsule Take 1 capsule (0.4 mg total) by mouth daily. 30 capsule 11    traMADol (ULTRAM) 50 mg tablet Take 1 tablet (50 mg total) by mouth every six (6) hours as needed for pain. No current facility-administered medications for this visit.       Allergies   Allergen Reactions    Poison Ivy Extract      Poison Ivy- Rash per patient       Laboratory:   06/27/12: PSA=3 (on Casodex), testosterone >800     Lab Results   Component Value Date    PSA Diagnostic 0.6 05/14/2014    PSA Diagnostic 0.5 02/05/2014    PSA Diagnostic 6.9 (H) 11/06/2013    PSA Diagnostic 2.4 07/24/2013    PSA Diagnostic 0.3 03/20/2013    PSA Diagnostic 0.2 12/26/2012       Lab on 01/17/2024   Component Date Value Ref Range Status    PSA 01/17/2024 <0.04  0.00 - 4.00 ng/mL Final    WBC 01/17/2024 8.4  3.6 - 11.2 10*9/L Final    RBC 01/17/2024 3.95 (L)  4.26 - 5.60 10*12/L Final    HGB 01/17/2024 12.2 (L)  12.9 - 16.5 g/dL Final    HCT 89/83/7974 35.8 (L)  39.0 - 48.0 % Final    MCV 01/17/2024 90.7  77.6 - 95.7 fL Final    MCH 01/17/2024 30.8  25.9 - 32.4 pg Final    MCHC 01/17/2024 34.0  32.0 - 36.0 g/dL Final    RDW 89/83/7974 13.5  12.2 - 15.2 % Final    MPV 01/17/2024 8.8  6.8 - 10.7 fL Final    Platelet 01/17/2024 240  150 - 450 10*9/L Final    Neutrophils % 01/17/2024 67.1  % Final    Lymphocytes % 01/17/2024 21.7  % Final    Monocytes % 01/17/2024 7.0  % Final    Eosinophils % 01/17/2024 3.5  % Final    Basophils % 01/17/2024 0.7  % Final    Absolute Neutrophils 01/17/2024 5.6  1.8 - 7.8 10*9/L Final    Absolute Lymphocytes 01/17/2024 1.8  1.1 -  3.6 10*9/L Final    Absolute Monocytes 01/17/2024 0.6  0.3 - 0.8 10*9/L Final    Absolute Eosinophils 01/17/2024 0.3  0.0 - 0.5 10*9/L Final    Absolute Basophils 01/17/2024 0.1  0.0 - 0.1 10*9/L Final     Lab Results   Component Value Date    PSA <0.04 01/17/2024    PSA <0.04 10/11/2023    PSA <0.04 06/14/2023    PSA 0.05 03/12/2023    PSA <0.04 10/19/2022    PSA <0.04 05/25/2022     3/17: Thyroid function WNL.

## 2024-01-17 NOTE — Unmapped (Signed)
 The [facility] has made a second and final attempt to reach this patient to refill the following medication:Xtandi  40mg .      We have left voicemails on the following phone numbers: [phone], have been unable to leave messages on the following phone numbers: [phone], and have sent a text message to the following phone numbers: [phone].    Dates contacted: 10/10,16  Last scheduled delivery: 12/19/23    The patient may be at risk of non-compliance with this medication. The patient should call the [facility] at [phone]  Option 4, then Option 1: Oncology to refill medication.    Maryla CHRISTELLA [facility] Specialty Technician

## 2024-01-18 DIAGNOSIS — R21 Rash and other nonspecific skin eruption: Principal | ICD-10-CM

## 2024-01-21 NOTE — Unmapped (Signed)
 [facility] Refill Coordination Note    Specialty Medication(s) to be Shipped:   Hematology/Oncology: Xtandi     Other medication(s) to be shipped: No additional medications requested for fill at this time    Specialty Medications not needed at this time: N/A     Calvin Obrien, DOB: [birth year]  Phone: [phone] (home)       All above HIPAA information was verified with patient.     Was a Nurse, learning disability used for this call? No    Completed refill call assessment today to schedule patient's medication shipment from the [facility]  [phone]).  All relevant notes have been reviewed.     Specialty medication(s) and dose(s) confirmed: Regimen is correct and unchanged.   Changes to medications: Calvin Obrien reports no changes at this time.  Changes to insurance: No  New side effects reported not previously addressed with a pharmacist or physician: None reported  Questions for the pharmacist: No    Confirmed patient received a Conservation officer, historic buildings and a Surveyor, mining with first shipment. The patient will receive a drug information handout for each medication shipped and additional FDA Medication Guides as required.       DISEASE/MEDICATION-SPECIFIC INFORMATION        N/A    SPECIALTY MEDICATION ADHERENCE     Medication Adherence    Patient reported X missed doses in the last month: 0  Specialty Medication: Xtandi  40 mg  Patient is on additional specialty medications: No  Informant: patient              Were doses missed due to medication being on hold? No    Xtandi  40 mg: 2 days of medicine on hand        REFERRAL TO PHARMACIST     Referral to the pharmacist: Not needed      [facility]     Shipping address confirmed in Epic.     Cost and Payment: Patient has a $0 copay, payment information is not required.    Delivery Scheduled: Yes, Expected medication delivery date: 01/23/24.     Medication will be delivered via UPS to the prescription address in Epic [place].    [name]   [facility]  Specialty Technician

## 2024-01-22 MED FILL — XTANDI 40 MG TABLET: ORAL | 30 days supply | Qty: 60 | Fill #4

## 2024-01-29 NOTE — Telephone Encounter (Signed)
 Returned Call to Calvin Obrien after receiving call from the communication center in reference to an appt in [facility].   He does not know exactly who he is suppose to be seeing at [facility] but is expecting an appt to be scheduled in [facility] by his NN for Dr. Renette.         Triage Recommendations:   I advised that I would reach out to [facility], NN  to see if she can assist him.  Advised that this request may take up to 24-48 hours depending on whether or not the team is in clinic.      Caller's Response:   He  was  appreciative of the call and is agreeable to this plan and will call back with any further questions/concerns      Outstanding tasks: Care team notified

## 2024-01-29 NOTE — Telephone Encounter (Signed)
 Copied from CRM #[id number]. Topic: Care Management - Discuss Care Plan  >> Jan 29, 2024  4:02 PM Reyes RAMAN wrote:      Calvin Obrien contacted the Communication Center requesting to speak with the care team of Calvin Obrien to discuss:    Patient has not heard anything back from [facility] clinic      Please contact Keni  at [phone].    Thank you,   Reyes DELENA Acre  [facility] Communication [facility]   [phone]

## 2024-01-31 NOTE — Progress Notes (Signed)
 Patient's cell phone not in service. His son will have him to give us  a call. If patient calls please ask him to call dermatology at [phone], option 2 to schedule his appointment. They have been trying to reach him but have not been able to reach him. Please have him call to schedule the appointment. They have the referral.

## 2024-02-06 DIAGNOSIS — R351 Nocturia: Principal | ICD-10-CM

## 2024-02-06 MED ORDER — MYRBETRIQ 25 MG TABLET,EXTENDED RELEASE
ORAL_TABLET | Freq: Every day | ORAL | 6 refills | 30.00000 days | Status: CP
Start: 2024-02-06 — End: ?

## 2024-02-06 NOTE — Progress Notes (Signed)
 Dermatology Note     Assessment and Plan:      Assessment & Plan    Asteatotic eczema of bilateral lower legs  Chronic condition with exacerbation or progression.   Chronic asteatotic eczema with xerosis and pruritus. No biopsy needed unless unresponsive to medication  - Prescribed triamcinolone 0.1% ointment twice daily for three weeks only   -discussed the common side effects of topical steroids including epidermal atrophy, striae formation and hypopigmentation; pt instructed not to place medication on face, armpits or groin  - Prescribed CeraVe cream daily post-shower.  - Advised Sarna lotion for itch relief as needed.  - Instructed to wear socks if ointment applied to feet to reduce fall risk  - Follow-up in one month.    The patient was advised to call for an appointment should any new, changing, or symptomatic lesions develop.     RTC: Return in about 1 month (around 03/08/2024). or sooner as needed   _________________________________________________________________      Chief Complaint     Chief Complaint   Patient presents with    Rash     rash like symptoms, on both LEG,s present for 4 months. Dry, scacly discoloration        HPI     Calvin Obrien is a 73 y.o. male who presents as a patient who is seen in consultation by Delon Janetta Pae, PA at the request of Omega Gladis Burdock, MD to Dermatology.    History of Present Illness  Calvin Obrien is a 73 year old male who presents with a lower leg rash. He was referred by his oncologist for evaluation of a lower leg rash.    Lower leg rash  - Rash present on lower legs for approximately four months  - Characterized by pruritus and occasional burning sensation  - No rash present on thighs  - No rash observed on feet, including the soles, though these areas are pruritic  - Skin is very dry, especially in colder weather    Prior and current treatments  - Over-the-counter anti-itch cream has not provided significant relief  - Previously used topical steroid; effectiveness is unclear  - Currently not using any prescribed topical treatments for the rash    The patient denies any other new or changing lesions or areas of concern.     Pertinent Past Medical History     No history of skin cancer    Problem List       Prostate cancer metastatic to bone (CMS-HCC) - Primary   Treating with Lupron  and enzalutamide      Past Medical History, Family History, Social History, Medication List, Allergies, and Problem List were reviewed in the rooming section of Epic.     ROS: Other than symptoms mentioned in the HPI, no fevers, chills, or other skin complaints    Physical Examination     GENERAL: Well-appearing male in no acute distress, resting comfortably.  NEURO: Alert and oriented, answers questions appropriately  PSYCH: Normal mood and affect  SKIN: Examination of the bilateral lower legs  and feet was performed       Physical Exam  SKIN: Bilateral lower legs with fish scale, xerotic changes, no open sores, minimal erythema.    All areas not commented on are within normal limits or unremarkable  - male chaperone present      (Approved Template 05/05/2023)

## 2024-02-06 NOTE — Telephone Encounter (Signed)
 Please refill if appropriate.     Most recent clinic visit: 01/17/2024  Next clinic visit: 04/17/2024

## 2024-02-07 DIAGNOSIS — L308 Other specified dermatitis: Principal | ICD-10-CM

## 2024-02-07 MED ORDER — CERAMIDES 1,3,6-II TOPICAL CREAM
TOPICAL | 11 refills | 0.00000 days | Status: CP
Start: 2024-02-07 — End: ?

## 2024-02-07 MED ORDER — TRIAMCINOLONE ACETONIDE 0.1 % TOPICAL OINTMENT
INTRAMUSCULAR | 1 refills | 0.00000 days | Status: CP
Start: 2024-02-07 — End: ?

## 2024-02-07 NOTE — Patient Instructions (Addendum)
 Meet your team:     Your intake nurse is: Bernice     Please remember to fill out the survey you will receive after your visit. Your comments help us  continue to improve our care.      Thanks in advance!      [facility] Dermatology Clinical Staff       Sarna anti-itch lotion

## 2024-02-15 NOTE — Progress Notes (Signed)
 [facility] Pharm[facility] Refill Coordination Note    Specialty Medication(s) to be Shipped:   Hematology/Oncology: Xtandi     Other medication(s) to be shipped: No additional medications requested for fill at this time    Specialty Medications not needed at this time: N/A     [name], Calvin Obrien  Phone: [phone] (home)       All above HIPAA information was verified with patient.     Was a nurse, learning disability used for this call? No    Completed refill call assessment today to schedule patient's medication shipment from the [facility]  [phone]).  All relevant notes have been reviewed.     Specialty medication(s) and dose(s) confirmed: Regimen is correct and unchanged.   Changes to medications: [name] reports no changes at this time.  Changes to insurance: No  New side effects reported not previously addressed with a pharmacist or physician: None reported  Questions for the pharmacist: No    Confirmed patient received a Conservation Officer, Historic Buildings and a Surveyor, Mining with first shipment. The patient will receive a drug information handout for each medication shipped and additional FDA Medication Guides as required.       DISEASE/MEDICATION-SPECIFIC INFORMATION        N/A    SPECIALTY MEDICATION ADHERENCE     Medication Adherence    Patient reported X missed doses in the last month: 0  Specialty Medication: Xtandi  40mg   Patient is on additional specialty medications: No  Informant: patient     Were doses missed due to medication being on hold? No    Xtandi  40 mg: 9 days of medicine on hand       REFERRAL TO PHARMACIST     Referral to the pharmacist: Not needed      [name]     Shipping address confirmed in Epic.     Cost and Payment: Patient has a $0 copay, payment information is not required.    Delivery Scheduled: Yes, Expected medication delivery date: 02/20/24.     Medication will be delivered via UPS to the prescription address in Epic [place].    [name]   [facility]  Specialty Technician

## 2024-02-19 MED FILL — XTANDI 40 MG TABLET: ORAL | 30 days supply | Qty: 60 | Fill #5

## 2024-03-04 ENCOUNTER — Inpatient Hospital Stay: Admit: 2024-03-04 | Discharge: 2024-03-04 | Payer: Medicare (Managed Care)

## 2024-03-11 DIAGNOSIS — C61 Malignant neoplasm of prostate: Principal | ICD-10-CM

## 2024-03-11 DIAGNOSIS — C7951 Secondary malignant neoplasm of bone: Principal | ICD-10-CM

## 2024-03-11 MED ORDER — XTANDI 40 MG TABLET
ORAL_TABLET | Freq: Every day | ORAL | 5 refills | 30.00000 days
Start: 2024-03-11 — End: ?

## 2024-03-12 ENCOUNTER — Ambulatory Visit: Admit: 2024-03-12 | Discharge: 2024-03-13 | Payer: Medicare (Managed Care)

## 2024-03-12 DIAGNOSIS — R29818 Other symptoms and signs involving the nervous system: Principal | ICD-10-CM

## 2024-03-12 DIAGNOSIS — M5441 Lumbago with sciatica, right side: Principal | ICD-10-CM

## 2024-03-12 DIAGNOSIS — M5442 Lumbago with sciatica, left side: Principal | ICD-10-CM

## 2024-03-12 DIAGNOSIS — G8929 Other chronic pain: Principal | ICD-10-CM

## 2024-03-12 NOTE — Progress Notes (Signed)
 ORTHOPAEDIC SPINE CLINIC NOTE       Fonda LELON Files, [place]  [email or web address]  [phone]        Patient Name:Calvin Obrien  MRN: [id number]  DOB: 21-Aug-1950    Date: 03/12/2024    PCP: Katrinka Karna SAILOR, MD    ASSESSMENT:     1. Neurogenic claudication    2. Chronic bilateral low back pain with bilateral sciatica          TION TSE is a 73 y.o. male with chronic low back pain and neurogenic claudication. RLE weakness and bilateral foot numbness.          PLAN and RECOMMENDATIONS:     Options reviewed with patient.    - Recommend continuing physical therapy  - Referral placed to pain management today for injection consideration.  - Patient would like to exhaust all conservative care prior to entertaining a surgical evaluation.    Patient Instructions   Recommendations/Plan:  Medications: Use OTC tylenol  and lidocaine patches (Salonpas) as needed  Activity: As tolerated   Imaging studies: NA  Next Steps: Referral to Pain mgmt  Return if symptoms worsen or fail to improve.   Contact our nursing team via MyChart or telephone with any clinical questions/concerns.  Our clinic nurse, Lucie Grieve can be reached at [phone].       Orders Placed This Encounter   Procedures    Ambulatory referral to Pain Clinic          SUBJECTIVE:     Chief Complaint:  Chief Complaint   Patient presents with    Follow-up     No new onset symptoms since his last visit        History of Present Illness:        03/12/24 0905   PainSc: 7        Calvin Obrien is a 73 y.o. 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  Date of onset: since 2022.     History of Present Illness    Established Patient Interval History from JL (03/12/24):  Here to review lumbar MRI.  Last seen by my colleague Derrek Mulders, NP        MANSON LUCKADOO is a 73 year old male who presents with worsening back pain and radicular pain down BLE, worse on RLE.      He has experienced worsening back pain since his last visit in 2022. Last seen by Dr. Janith in 03/2021 and physical therapy was recommended.  The pain is exacerbated by standing for prolonged periods and is relieved by sitting or lying down. He uses a motorized cart for mobility in stores and notes that leaning over helps alleviate the pain.     He experiences pain radiating down both legs to his feet, accompanied by swelling and numbness. He reports weakness in his legs but no issues with his hands. Reports RLE is weaker than LLE. No recent falls have occurred.    He currently takes tramadol for pain management and has previously been prescribed oxycodone , which he found effective. He does not take Tylenol  or Motrin. He mentions that prior injections provided relief for only a few days.      Current Treatments  Previous Treatments   Current Relevant Pain Medications:  Tramadol    Current Physical Therapy: No recent PT    Adjunct Treatments: Heat/Ice    In a Pain Clinic: No  - [facility] (Dr. Joycelyn)  - Saw pain psychology (04/2016) Prior Relevant Pain Medications:  - Oxycodone , Hydrocodone  - Gabapentin  Prior Physical Therapy: Yes. Last in 2022    Injections:Yes.  -TFESI in 2011 with Dr. Marvelyn (not helpful)      Prior Relevant Surgeries: Yes.  Spinal cord stimulatory trial in 2009 with Dr. Marvelyn           Medical History   He  has a past medical history of Acid reflux, Hyperlipemia, Hypertension, and Prostate cancer    (CMS-HCC).     Surgical History   He  has a past surgical history that includes pr laminec/facetect/foramin,lumbar 1 seg (N/A, 01/31/2016); pr lam facetectomy&foramot 1 vrt sgm ea addl sgm (N/A, 01/31/2016); pr insert,inflatable penile prosthesis (N/A, 03/16/2017); and pr remvl,inflat penile prosth w/o replacmt (N/A, 04/20/2017).     Allergies   Poison ivy extract   Medications   He has a current medication list which includes the following prescription(s): alendronate , aspirin, ceramides 1,3,6-ii, enzalutamide , ezetimibe, gabapentin, hydrochlorothiazide , hydrocodone-acetaminophen , lisinopril, myrbetriq , omeprazole, oxybutynin, oxycodone , tramadol, triamcinolone , dulera , rivaroxaban , sildenafil, tadalafil, and tamsulosin .   Review of Systems A 10-system review was performed by questionnaire and noted in the electronic chart.  Positives noted/discussed.  Balance of systems was negative.    Fever/chills: denies  Recent unexplained weight loss: denies  Bowel/bladder symptoms: denies   Family History His family history includes Hypertension in his father and mother.     Social History He  reports that he has been smoking cigarettes. He started smoking about 50 years ago. He has a 10.1 pack-year smoking history. He has never used smokeless tobacco. He reports current alcohol use of about 4.0 standard drinks of alcohol per week. He reports current drug use. Drug: Marijuana.        Occupational History    Not on file          OBJECTIVE:     PHYSICAL EXAM:  Vitals: Temp 36.7 ??C (98 ??F) (Temporal)  - Ht 167.7 cm (5' 6.02)  - Wt 76.4 kg (168 lb 6.4 oz)  - BMI 27.16 kg/m??   Appearance: well-nourished and no acute distress   Skin: No cyanosis or clubbing in bilat hands. and No edema in the feet.  Pulses: 2+ DP pulses bilaterally     Neurologic exam:   Mental Status: Calvin Obrien is oriented to time, place, and person & is alert and cooperative  Motor: Normal bulk and tone without evidence of pronator drift or fasciculations. No abnormal movements.  Gait: stooped gait.   Cerebellum/Coordination: Romberg is positive.    Motor R L  Reflexes R L   IP 4 5  Patellar 1-2+ 1-2+   Quad 4 5  Achilles 1-2+ 1-2+       Pathologic R L   TA 4 5  Hoffmann's neg. neg.   EHL 4 5  Clonus neg. neg.   GS 4 5  Babinski neg. neg.     Sensory:   Lumbar Sensory to Light Touch Right  Left    Anterior thigh (L2) Normal Normal   Posterior thigh (S2) Normal Normal   Medial thigh (L3) Normal Normal   Medial calf (L4) Normal Normal   Dorsal foot (L5) Diminished  Diminished   Lateral foot (S1) Diminished Diminished         Thoracolumbar Spine Exam:  Inspection: No swelling, erythema, deformity, atrophy or hypertrophy noted  Lumbar Spine ROM: mildly restricted  Spine Palpation: non-tender to palpation, normal skin    Facet Loading Test: Positive Bilaterally  Nerve Tension Signs: Lhermitte's negative, Slump test positive right, and Slump test positive left    Sacroiliac Joint:   Inspection: Normal alignment. No erythema, discoloration, or asymmetry.  Fortin Finger's Test: Deferred  Palpation: No tenderness at PSIS    Right Hip/Knee:   No pain with hip internal rotation/loading. Normal internal rotation ROM.   No knee pain with flexion/extension. No atrophy noted upon inspection. Normal skin. Normal ROM and stability.  Left Hip/Knee:   No pain with hip internal rotation/loading. Normal internal rotation ROM.   No knee pain with flexion/extension. No atrophy noted upon inspection. Normal skin. Normal ROM and stability.         MEDICAL DECISION MAKING    Test Results:  Imaging:   CT Abdomen & Pelvis. [facility] Date:05/2023.  Impression: Moderate Degenerative changes.     L-XR 10/25: Severe DDD. Mild retrolisthesis at L3/4   L-MRI: Severe left foraminal narrowing from L2-S1. Severe right foraminal narrowing from L3-L5.      The available reports were reviewed.    Labs:  Lab Results   Component Value Date    A1C 5.5 03/08/2017       Discussion:  Clinical findings, diagnostic/treatment options, and plan were discussed with the patient.  Activities - Advised activities as tolerated, using pain as a guide.  Imaging - Discussed pros/cons of advanced imaging options.      E&M Coding:    MEDICAL DECISION MAKING (level of service defined by 2/3 elements)     Number/Complexity of Problems Addressed 1 or more chronic illnesses with exacerbation, progression, or side effects of treatment (99204/99214)   Amount/Complexity of Data to be Reviewed/Analyzed 2 points: Review prior notes (1 point per unique source); Review test results (1 point per unique test); Order tests (1 point per unique test) (99203/99213)   Risk of Complications/Morbidity/Mortality of Management Over-the-counter Medications (99203/99213)  Physical Therapy/Occupational Therapy (99203/99213)     Or TIME     Total Time for E/M Services on the Date of Encounter N/A          cc: Katrinka Karna SAILOR, MD, Katrinka Karna SAILOR, MD

## 2024-03-12 NOTE — Progress Notes (Unsigned)
 Dermatology Note     Assessment and Plan:      Assessment & Plan      Benign Lesions/ Findings:   {derm skin check benign A&P:75424}  - Reassurance provided regarding the benign appearance of lesions noted on exam today; no treatment is indicated in the absence of symptoms/changes.  - Reinforced importance of photoprotective strategies including liberal and frequent sunscreen use of a broad-spectrum SPF 30 or greater, use of protective clothing, and sun avoidance for prevention of cutaneous malignancy and photoaging.  Counseled patient on the importance of regular self-skin monitoring as well as routine clinical skin examinations as scheduled.     Prostate cancer metastatic to bone (CMS-HCC)  ***    Asteatotic dermatitis  ***      The patient was advised to call for an appointment should any new, changing, or symptomatic lesions develop.     RTC: No follow-ups on file. or sooner as needed   _________________________________________________________________      Chief Complaint     No chief complaint on file.      HPI     Calvin Obrien is a 73 y.o. male who presents as a returning patient (last seen 02/07/2024) to Dermatology.    At last visit the following recommendations were given:   Triamcinolone   ointment, cerave cream, sarna lotion  History of Present Illness          The patient denies any other new or changing lesions or areas of concern.     Pertinent Past Medical History     No history of skin cancer    Problem List          Musculoskeletal and Integument    Prostate cancer metastatic to bone (CMS-HCC) - Primary     ***    Family History:   {derm skin check melanoma history:75452}    Past Medical History, Family History, Social History, Medication List, Allergies, and Problem List were reviewed in the rooming section of Epic.     ROS: Other than symptoms mentioned in the HPI, {derm skin check ros:75408::no fevers, chills, or other skin complaints}    Physical Examination     GENERAL: Well-appearing male in no acute distress, resting comfortably.  {PE systems:75418::NEURO: Alert and oriented, answers questions appropriately}  {PE extent:75514}  {PE list:75421}  ***  Physical Exam      {PE limitations:75419::All areas not commented on are within normal limits or unremarkable}      (Approved Template 05/05/2023)

## 2024-03-12 NOTE — Patient Instructions (Addendum)
 Recommendations/Plan:  Medications: Use OTC tylenol  and lidocaine patches (Salonpas) as needed  Activity: As tolerated   Imaging studies: NA  Next Steps: Referral to Pain mgmt  Return if symptoms worsen or fail to improve.   Contact our nursing team via MyChart or telephone with any clinical questions/concerns.  Our clinic nurse, Lucie Grieve can be reached at [phone].

## 2024-03-13 MED ORDER — ENZALUTAMIDE 40 MG TABLET
ORAL_TABLET | Freq: Every day | ORAL | 5 refills | 30.00000 days | Status: CP
Start: 2024-03-13 — End: ?
  Filled 2024-03-20: qty 60, 30d supply, fill #0

## 2024-03-17 NOTE — Progress Notes (Signed)
 The [facility] has made a second and final attempt to reach this patient to refill the following medication:Xtandi  40mg .      We have left voicemails on the following phone numbers: [phone], have been unable to leave messages on the following phone numbers: [phone], and have sent a text message to the following phone numbers: [phone].    Dates contacted: 12/10,11,15  Last scheduled delivery: 02/19/24    The patient may be at risk of non-compliance with this medication. The patient should call the [facility] at [phone]  Option 4, then Option 1: Oncology to refill medication.    [facility] Specialty Technician

## 2024-03-19 NOTE — Progress Notes (Signed)
 [facility] - Clinical Pharmacist Intervention   Reason for Intervention Issue Observed Side effect management          Additional Comments flaky itchy skin, no redness, bumps or blisters   Intervention Type of Intervention Education performed  Recommend OTC supportive care         Eucerin or Cetaphil cream, apply twice daily especially after baths/showers        Recommendation provided, if applicable      Medication Medication(s) Involved Xtandi    Outcome Expected Result/Outcome of Intervention Enhanced patient understanding    Additional Comments      Follow-up Needed? No    Additional Follow-up Comments     Supporting Information Approximate Time Spent on Intervention 0-10 minutes    Clinical Evidence Used Professional judgement     Aneita Merck, PharmD  [facility]

## 2024-03-19 NOTE — Progress Notes (Signed)
 [facility] Refill Coordination Note    Specialty Medication(s) to be Shipped:   Hematology/Oncology: Xtandi     Other medication(s) to be shipped: No additional medications requested for fill at this time    Specialty Medications not needed at this time: N/A     Calvin Obrien, DOB: 01-Nov-1950  Phone: [phone] (home)       All above HIPAA information was verified with patient.     Was a nurse, learning disability used for this call? No    Completed refill call assessment today to schedule patient's medication shipment from the [facility]  [phone]).  All relevant notes have been reviewed.     Specialty medication(s) and dose(s) confirmed: Regimen is correct and unchanged.   Changes to medications: [name] reports no changes at this time.  Changes to insurance: No  New side effects reported not previously addressed with a pharmacist or physician: Yes - Patient reports itchy flaky skin. Patient would like to speak to the pharmacist today. Their provider is not aware.  Questions for the pharmacist: Yes: itchy flaky skin    Confirmed patient received a Conservation Officer, Historic Buildings and a Surveyor, Mining with first shipment. The patient will receive a drug information handout for each medication shipped and additional FDA Medication Guides as required.       DISEASE/MEDICATION-SPECIFIC INFORMATION        N/A    SPECIALTY MEDICATION ADHERENCE     Medication Adherence    Patient reported X missed doses in the last month: 0  Specialty Medication: Xtandi  40mg   Patient is on additional specialty medications: No  Informant: patient  Confirmed plan for next specialty medication refill: delivery by pharmacy  Refills needed for supportive medications: not needed          Refill Coordination    Has the Patients' Contact Information Changed: No  Is the Shipping Address Different: No         Were doses missed due to medication being on hold? No    Xtandi  40 mg: 5 days of medicine on hand Specialty medication is an injection or given on a cycle: No    REFERRAL TO PHARMACIST     Referral to the pharmacist: spoke with patient about itchy flaky skin, see clinical intervention      SHIPPING     Shipping address confirmed in Epic.     Cost and Payment: Patient has a $0 copay, payment information is not required.    Delivery Scheduled: Yes, Expected medication delivery date: 03/21/24.     Medication will be delivered via UPS to the prescription address in Epic WAM.    Calvin Obrien, [facility]   [facility]  Specialty Pharmacist

## 2024-03-25 DIAGNOSIS — X58XXXA Exposure to other specified factors, initial encounter: Secondary | ICD-10-CM | POA: Insufficient documentation

## 2024-03-25 DIAGNOSIS — I10 Essential (primary) hypertension: Secondary | ICD-10-CM | POA: Insufficient documentation

## 2024-03-25 DIAGNOSIS — T162XXA Foreign body in left ear, initial encounter: Secondary | ICD-10-CM | POA: Insufficient documentation

## 2024-03-25 DIAGNOSIS — Z79899 Other long term (current) drug therapy: Secondary | ICD-10-CM | POA: Diagnosis not present

## 2024-03-26 ENCOUNTER — Other Ambulatory Visit: Payer: Self-pay

## 2024-03-26 ENCOUNTER — Encounter (HOSPITAL_COMMUNITY): Payer: Self-pay | Admitting: Emergency Medicine

## 2024-03-26 ENCOUNTER — Emergency Department (HOSPITAL_COMMUNITY)
Admission: EM | Admit: 2024-03-26 | Discharge: 2024-03-26 | Disposition: A | Discharge status: Home / Self Care | Attending: Emergency Medicine | Admitting: Emergency Medicine

## 2024-03-26 DIAGNOSIS — R09A9 Foreign body sensation, other site: Secondary | ICD-10-CM

## 2024-03-26 DIAGNOSIS — T162XXA Foreign body in left ear, initial encounter: Secondary | ICD-10-CM | POA: Diagnosis not present

## 2024-03-26 MED ORDER — LIDOCAINE VISCOUS HCL 2 % SOLUTION FOR USE IN EAR (ED/BUG EXTRACTION)
15.0000 mL | Freq: Once | OROMUCOSAL | Status: AC
  Administered 2024-03-26: 15 mL via OTIC
  Filled 2024-03-26: qty 15

## 2024-03-26 NOTE — Discharge Instructions (Signed)
 Return to the emergency department for any further symptoms of concern.

## 2024-03-26 NOTE — ED Provider Notes (Signed)
 " [place] EMERGENCY DEPARTMENT AT [facility] Provider Note   CSN: [id number] Arrival date & time: 03/25/24  2340     Patient presents with: Foreign Body in Ear   REG BIRCHER is a 73 y.o. male.    Foreign Body in Ear  Patient presents for concern of left ear foreign body.  Medical history includes HLD, GERD, chronic pain, arthritis, HTN.  Starting this afternoon, he felt a foreign body sensation in his left ear.  He feels like it has been moving.  Symptoms have been persistent throughout the evening.  He denies any other associated symptoms.     Prior to Admission medications  Medication Sig Start Date End Date Taking? Authorizing Provider  azithromycin  (ZITHROMAX ) 250 MG tablet Take 1 tablet (250 mg total) by mouth daily. Take first 2 tablets together, then 1 every day until finished. 03/19/23   Melvenia Motto, MD  enzalutamide REUBIN) 40 MG capsule Take 160 mg by mouth daily.    [provider]  ezetimibe  (ZETIA ) 10 MG tablet Take 10 mg by mouth daily.  08/18/11   [provider]  gabapentin  (NEURONTIN ) 600 MG tablet Take 600 mg by mouth 3 (three) times daily.  12/19/16   [provider]  hydrochlorothiazide (HYDRODIURIL) 25 MG tablet Take 25 mg by mouth daily.    [provider]  lisinopril  (PRINIVIL ,ZESTRIL ) 40 MG tablet Take 40 mg by mouth daily. 12/25/14   [provider]  naproxen  (NAPROSYN ) 375 MG tablet Take 1 tablet (375 mg total) by mouth 2 (two) times daily. 05/19/23   Suellen Cantor A, PA-C  omeprazole  (PRILOSEC) 20 MG capsule Take 20 mg by mouth daily.    [provider]  oxybutynin  (DITROPAN ) 5 MG tablet Take 5-15 mg by mouth See admin instructions. Taking 1 tablet (5mg ) in the morning and 3 tablets (15mg ) at bedtime.    [provider]  pregabalin  (LYRICA ) 150 MG capsule Take 150 mg by mouth 2 (two) times daily.     [provider]  tamsulosin  (FLOMAX ) 0.4 MG CAPS capsule Take 0.4 mg by  mouth daily. 04/19/12   [provider]  VENTOLIN  HFA 108 (90 BASE) MCG/ACT inhaler Inhale 2 puffs into the lungs every 4 (four) hours as needed for wheezing.  03/16/15   [provider]    Allergies: Patient has no known allergies.    Review of Systems  HENT:         Left ear foreign body sensation  All other systems reviewed and are negative.   Updated Vital Signs BP (!) 146/58   Pulse 83   Temp 97.7 F (36.5 C) (Oral)   Resp 19   Ht 5' 7 (1.702 m)   Wt 77.8 kg   SpO2 97%   BMI 26.86 kg/m   Physical Exam Vitals and nursing note reviewed.  Constitutional:      General: He is not in acute distress.    Appearance: Normal appearance. He is well-developed. He is not ill-appearing, toxic-appearing or diaphoretic.  HENT:     Head: Normocephalic and atraumatic.     Right Ear: Tympanic membrane, ear canal and external ear normal. There is no impacted cerumen.     Left Ear: Tympanic membrane, ear canal and external ear normal. There is no impacted cerumen.     Nose: Nose normal.     Mouth/Throat:     Mouth: Mucous membranes are moist.  Eyes:     Extraocular Movements: Extraocular movements  intact.     Conjunctiva/sclera: Conjunctivae normal.  Cardiovascular:     Rate and Rhythm: Normal rate and regular rhythm.  Pulmonary:     Effort: Pulmonary effort is normal. No respiratory distress.  Abdominal:     General: There is no distension.  Musculoskeletal:        General: Normal range of motion.     Cervical back: Normal range of motion and neck supple.  Skin:    General: Skin is warm and dry.     Coloration: Skin is not jaundiced or pale.  Neurological:     General: No focal deficit present.     Mental Status: He is alert and oriented to person, place, and time.  Psychiatric:        Mood and Affect: Mood normal.        Behavior: Behavior normal.     (all labs ordered are listed, but only abnormal results are displayed) Labs Reviewed - No data to  display  EKG: None  Radiology: No results found.   Procedures   Medications Ordered in the ED  lidocaine  (XYLOCAINE ) viscous 2% for use in ear (bug extraction) (15 mLs OTIC (EAR) Given 03/26/24 0131)                                    Medical Decision Making Risk Prescription drug management.   Patient presenting for foreign body sensation in his left ear.  He first noticed it this afternoon and symptoms have been persistent since then.  He feels like the object is moving.  On exam, he is overall well-appearing.  On otoscope inspection, I do have a clear view of canal and TM.  I do not appreciate any foreign bodies or bugs.  Patient, however, still endorses sensation of something moving in his ear canal.  In the event that it is a very small bug that is out of direct view on otoscope inspection, will give viscous lidocaine .  Patient underwent EAC irrigation with viscous lidocaine .  He had resolution of symptoms.  On repeat otoscope inspection, canal and TM are again clear of any foreign bodies.  Patient was discharged in stable condition.     Final diagnoses:  Foreign body sensation in left ear canal    ED Discharge Orders     None          Melvenia Motto, MD 03/26/24 [phone]  "

## 2024-03-26 NOTE — ED Notes (Signed)
 ED Provider at bedside.

## 2024-03-26 NOTE — ED Triage Notes (Signed)
 Pt states it feels like something is moving in his left ear all day.

## 2024-04-17 ENCOUNTER — Ambulatory Visit: Admit: 2024-04-17 | Discharge: 2024-04-17 | Payer: Medicare (Managed Care)

## 2024-04-17 ENCOUNTER — Other Ambulatory Visit: Admit: 2024-04-17 | Discharge: 2024-04-17 | Payer: Medicare (Managed Care)

## 2024-04-17 ENCOUNTER — Ambulatory Visit
Admit: 2024-04-17 | Discharge: 2024-04-17 | Payer: Medicare (Managed Care) | Attending: Medical Oncology | Primary: Medical Oncology

## 2024-04-17 DIAGNOSIS — C7951 Secondary malignant neoplasm of bone: Principal | ICD-10-CM

## 2024-04-17 DIAGNOSIS — L308 Other specified dermatitis: Principal | ICD-10-CM

## 2024-04-17 DIAGNOSIS — C61 Malignant neoplasm of prostate: Principal | ICD-10-CM

## 2024-04-17 LAB — CBC W/ AUTO DIFF
BASOPHILS ABSOLUTE COUNT: 0.1 10*9/L (ref 0.0–0.1)
BASOPHILS RELATIVE PERCENT: 0.8 %
EOSINOPHILS ABSOLUTE COUNT: 0.2 10*9/L (ref 0.0–0.5)
EOSINOPHILS RELATIVE PERCENT: 3.5 %
HEMATOCRIT: 41.9 % (ref 39.0–48.0)
HEMOGLOBIN: 13.8 g/dL (ref 12.9–16.5)
LYMPHOCYTES ABSOLUTE COUNT: 1.9 10*9/L (ref 1.1–3.6)
LYMPHOCYTES RELATIVE PERCENT: 27.4 %
MEAN CORPUSCULAR HEMOGLOBIN CONC: 33 g/dL (ref 32.0–36.0)
MEAN CORPUSCULAR HEMOGLOBIN: 30.3 pg (ref 25.9–32.4)
MEAN CORPUSCULAR VOLUME: 91.8 fL (ref 77.6–95.7)
MEAN PLATELET VOLUME: 8.7 fL (ref 6.8–10.7)
MONOCYTES ABSOLUTE COUNT: 0.6 10*9/L (ref 0.3–0.8)
MONOCYTES RELATIVE PERCENT: 8.1 %
NEUTROPHILS ABSOLUTE COUNT: 4.2 10*9/L (ref 1.8–7.8)
NEUTROPHILS RELATIVE PERCENT: 60.2 %
PLATELET COUNT: 234 10*9/L (ref 150–450)
RED BLOOD CELL COUNT: 4.57 10*12/L (ref 4.26–5.60)
RED CELL DISTRIBUTION WIDTH: 14 % (ref 12.2–15.2)
WBC ADJUSTED: 7 10*9/L (ref 3.6–11.2)

## 2024-04-17 LAB — PSA: PROSTATE SPECIFIC ANTIGEN: <0.04 ng/mL (ref 0.00–4.00)

## 2024-04-17 MED ORDER — TRIAMCINOLONE ACETONIDE 0.1 % TOPICAL OINTMENT
INTRAMUSCULAR | 1 refills | 0.00000 days | Status: CP
Start: 2024-04-17 — End: ?
  Filled 2024-04-24: qty 80, 30d supply, fill #0

## 2024-04-17 MED ADMIN — leuprolide (LUPRON) injection 22.5 mg: 22.5 mg | INTRAMUSCULAR | @ 17:00:00 | Stop: 2024-04-17

## 2024-04-17 NOTE — Progress Notes (Signed)
 MEDICAL ONCOLOGY FOLLOW UP VISIT      Impression:     Calvin Obrien is a 74 y.o. 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  After adding enzalutamide , PSA dropped to undetectable, has remained undetectable.    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  He is followed in a pain clinic and has seen Palliative Care.    He is feeling very well with good energy. No change since prior visits.    Based on prior PSA values and how he is feeling, we will continue current therapy with Lupron  and enzalutamide .      Lab Results   Component Value Date    PSA <0.04 01/17/2024    PSA <0.04 10/11/2023    PSA <0.04 06/14/2023    PSA 0.05 03/12/2023    PSA <0.04 10/19/2022    PSA <0.04 05/25/2022       Plan:   - Continue 71-month Lupron  22.5mg  (I tried to change to 55-month injection but insurance refused this apparently).  - Dose reduced enzalutamide  to see if improves his skin reaction (dry irritated skin on face. Legs) - two 40mg  tablets in the morning only now (previously switched to 40 mg tablets because he was mechanically unable to swallow the larger tablets).  Skin issues have resolved.  - Asteatotic eczema of bilateral lower legs: seen by dermatology.  - Pain - has been managed by palliative care.  - He was previously referred to [facility] for ED assessment (and can also discuss urinary leakage).  - Follow up PSA.  - He will follow up with his pain clinic and with palliative Care here as warranted.  - Urinary leakage: He has seen Calvin Bring, NP for evaluation. He reports taking 1 pill in AM, 3 pills in PM, unclear which medication, requested that he Obrien medications with him to next visit.  - BMD scan was done 11/25/20 with low bone density - started Fosamax .  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  - For ED, has seen Dr. Johnetta in the past.  Viagra does not help.  He has spoken with Urology about options.    - Spine: He had prior decompression, now doing back exercises with substantial improvement of symptoms.  - Hot flashes - on Gabapentin 300 TID, Izetta Search PhD has counseled, pharmacy seeing today again. Stable  -   Code Status: Prior     I spent 32 minutes on records review, meeting with the patient, documentation and coordination on the day of service.    RTC in 3 months for next Lupron , and to check health status.      -------------------------------    Other Physicians:   Dr. Selinda Dolly [facility] Pain Medicine)  Dr. Franky Ambrosia [facility] PM&R)  Dr. Prentice Flatten (referring; Radiation Oncology)   Dr. Norleen Kappa (Urology)   Dr. Elsie Bolls (Anesthesia pain)   Dr. Tomasita Johnetta (Urology, RE: ED)   Dr. 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     Current therapy: Lupron     Oncology history:   - Diagnosed 2008 with  prostate cancer, PSA 13.5.   - Prostate biopsy on 04/02/07 w Gleason 3+4=7   - 2009 Radiation therapy to w Dr. Millard [facility] 78 Gy.   - 2012-13: Rising PSA   - 11/13: Saw [facility], felt not candidate for salvage RT   - 2/14: BS c/w 2009 with suspicious increased uptake at T5, L inferior iliac bone, subtle at L1.   - CT A/P shows sclerosis at L4-5 equivocal for DJD vs metastases, I discussed w radiology, felt most likely metastatic.   - 3/14: Lupron  started   - 12/14: Lupron  held for intermittent approach  - 11/13/13: Restarted Lupron  due to PSA rise with worsening back pain  - 8/15: Palliative RT to spine in [place], [place].  - 05/25/14:  MRI spine: Multilevel degenerative changes, most prominent at L3-4 and L4-L5, where there is marked spinal canal stenosis and crowding with potential indentation of the nerve roots. Severe bilateral neural foraminal narrowing at L4-5 and moderate to marked neuroforaminal narrowing at L3-4 on the right. Additional multilevel degenerative changes as outlined above.    - 08/15/15: Abnormal focal marrow signal and enhancement at the endplates surrounding the L4-5 disc space are favored to represent acutely inflamed Schmorl's nodes. Metastatic disease is felt less likely but is not entirely excluded, particularly given enlargement of the lesion at the L5 level. Continued follow-up recommended.  - 6/17: Bone mineral density: The femoral neck density is mildly low, but the spine and total femoral densities are normal.  - 10/17: L2-S1 decompression surgery w Dr. Rolan Obrien, postoperative alcohol withdrawal and patient subsequently states he quit drinking.  - 12/17: L ankle pain, negative xray.  - 04/20/18: CT A/P: Stable heterogeneity of the left iliac wing representing known metastatic disease. No new evidence of metastatic disease within the abdomen/pelvis.  - 04/25/18: Bone scan: Increased radiotracer activity within the left iliac bone concerning for osseous metastases.  - 08/08/18: Castration resistant by PSA - start process for enzalutamide   -06/14/23: PSA remains undetectable    ROS: Chronic ongoing fatigue which is worse on Lupron  but stable since last visit.  Ongoing back pain w L sciatica.  Hot flashes persist on Lupron , daily.  Nocturia/chronic dysuria: much better with Ditropan 0-1x/night.  No pain.  Persistent ED.  Remainder of 10 system ROS otherwise negative.    Physical exam:   BP 134/63  - Pulse 75  - Temp 35.7 ??C (96.3 ??F) (Temporal)  - Resp 22  - Wt 76.2 kg (168 lb 1.6 oz)  - SpO2 99%  - BMI 27.11 kg/m??   NAD  A+Ox3  No visible rash  No dyspnea or cough  No apparent neurological deficit    Current Outpatient Medications   Medication Sig Dispense Refill    alendronate  (FOSAMAX ) 70 MG tablet TAKE ONE TABLET BY MOUTH WEEKLY 4 tablet 11    aspirin (ECOTRIN) 81 MG tablet Take 1 tablet (81 mg total) by mouth daily.      ceramides 1,3,6-II (CERAVE) Apply daily to lower legs after shower while skin is still damp 453 g 11    DULERA  200-5 mcg/actuation HFAA 20 mcg Two (2) times a day. (Patient not taking: Reported on 01/16/2024)      enzalutamide  (XTANDI ) 40 mg tablet Take 2 tablets (80 mg total) by mouth daily. Swallow tablets whole. Do not chew, crush or split tablets. 60 tablet 5    ezetimibe (ZETIA) 10 mg tablet Take 1 tablet (10 mg total) by mouth daily. Frequency:QD   Dosage:10  MG  Instructions:  Note:Dose: 10MG       gabapentin (NEURONTIN) 600 MG tablet Take 1 tablet (600 mg total) by mouth two (2) times a day.      hydroCHLOROthiazide  (HYDRODIURIL ) 25 MG tablet Take 1 tablet (25 mg total) by mouth daily.      lisinopril (PRINIVIL,ZESTRIL) 40 MG tablet Take 1 tablet (40 mg total) by mouth daily.      MYRBETRIQ  25 mg Tb24 extended-release tablet TAKE ONE TABLET BY MOUTH ONCE DAILY 30 tablet 6    omeprazole (PRILOSEC) 20 MG capsule Take 1 capsule (20 mg total) by mouth daily.      oxybutynin (DITROPAN) 5 MG tablet Take by mouth. 5 mg qAM and 15 mg qPM  0    oxyCODONE  (ROXICODONE ) 5 MG immediate release tablet 1 tablet (5 mg total)  in the morning.      rivaroxaban  (XARELTO ) 2.5 mg tablet Take 1 tablet (2.5 mg total) by mouth two (2) times a day. (Patient not taking: Reported on 01/16/2024) 60 tablet 11    sildenafiL (VIAGRA) 100 MG tablet Take 1 tablet (100 mg total) by mouth nightly as needed for erectile dysfunction. (Patient not taking: Reported on 01/16/2024) 90 tablet 3    tadalafil 20 MG tablet Take 1 tablet (20 mg total) by mouth in the morning. (Patient not taking: Reported on 01/16/2024) 90 tablet 3    tamsulosin  (FLOMAX ) 0.4 mg capsule Take 1 capsule (0.4 mg total) by mouth daily. 30 capsule 11    traMADol (ULTRAM) 50 mg tablet Take 1 tablet (50 mg total) by mouth every six (6) hours as needed for pain. triamcinolone  (KENALOG ) 0.1 % ointment apply to both lower legs , 2 times a day for 3 weeks. Not for long term continuous use, not for face or skin folds. 80 g 1     No current facility-administered medications for this visit.       Allergies   Allergen Reactions    Poison Ivy Extract      Poison Ivy- Rash per patient       Laboratory:   06/27/12: PSA=3 (on Casodex), testosterone >800     Lab Results   Component Value Date    PSA Diagnostic 0.6 05/14/2014    PSA Diagnostic 0.5 02/05/2014    PSA Diagnostic 6.9 (H) 11/06/2013    PSA Diagnostic 2.4 07/24/2013    PSA Diagnostic 0.3 03/20/2013    PSA Diagnostic 0.2 12/26/2012       Lab on 04/17/2024   Component Date Value Ref Range Status    WBC 04/17/2024 7.0  3.6 - 11.2 10*9/L Final    RBC 04/17/2024 4.57  4.26 - 5.60 10*12/L Final    HGB 04/17/2024 13.8  12.9 - 16.5 g/dL Final    HCT 98/84/7973 41.9  39.0 - 48.0 % Final    MCV 04/17/2024 91.8  77.6 - 95.7 fL Final    MCH 04/17/2024 30.3  25.9 - 32.4 pg Final    MCHC 04/17/2024 33.0  32.0 - 36.0 g/dL Final    RDW 98/84/7973 14.0  12.2 - 15.2 % Final    MPV 04/17/2024 8.7  6.8 - 10.7 fL Final    Platelet 04/17/2024 234  150 - 450 10*9/L Final    Neutrophils % 04/17/2024 60.2  % Final    Lymphocytes % 04/17/2024 27.4  % Final    Monocytes % 04/17/2024 8.1  % Final    Eosinophils % 04/17/2024 3.5  % Final  Basophils % 04/17/2024 0.8  % Final    Absolute Neutrophils 04/17/2024 4.2  1.8 - 7.8 10*9/L Final    Absolute Lymphocytes 04/17/2024 1.9  1.1 - 3.6 10*9/L Final    Absolute Monocytes 04/17/2024 0.6  0.3 - 0.8 10*9/L Final    Absolute Eosinophils 04/17/2024 0.2  0.0 - 0.5 10*9/L Final    Absolute Basophils 04/17/2024 0.1  0.0 - 0.1 10*9/L Final     Lab Results   Component Value Date    PSA <0.04 01/17/2024    PSA <0.04 10/11/2023    PSA <0.04 06/14/2023    PSA 0.05 03/12/2023    PSA <0.04 10/19/2022    PSA <0.04 05/25/2022     3/17: Thyroid function WNL.

## 2024-04-17 NOTE — Progress Notes (Signed)
 OUTPATIENT SOCIAL WORKER [LCSW] PROGRESS NOTE     Service: Adult Hematology Oncology        Patient Rossin) is a 74 y.o. male with Prostate Cancer metastatic to bone. LCSW spoke with Norleen by telephone on 04/17/2024.     SW received an Inbasket from the RN, asking if I can call the patient to help him problem-solve receiving help with prescriptions, etc...      Narrative: LCSW checked in with Sencere by telephone to assess for any new needs since most recent conversation (environmental manager, housing/utility/food/transportation access, coping/adjustment, etc).     Orva administrator, sports for check-in.  SW informed Christoher that I have completed some research regarding some potentially help with getting his prescriptions filled. SW informed the patient that he will have to complete the form and send it in. SW informed the patient that it's for [place] patient's only. SW asked the patient if he would like for me to email it or send it via [place]  mail (patient asked if I mail it to another residence).     LCSW encouraged patient/family to reach out should any resource/assistance/support needs arise; they agreed.      LCSW Plan for Follow-Up: LCSW will continue to be available for psychosocial support and resource assistance as needed/appropriate.    Daevon Holdren H. [facility]  Ambulatory Social Worker II  [phone]

## 2024-04-17 NOTE — Progress Notes (Signed)
 Pt tolerated Lupron  injection without difficulty. [facility] ambulatory,steady gait, NAD, no questions, complaints, nor concerns voiced at d/c.   Nurse navigator checking to see if possible to get medication patient could not afford at home (Kennalog creme) filled here at [facility]. Has spoken with outpatient pharmacy who can fill medication for 1.60. Patient states he can afford that.

## 2024-04-18 NOTE — Telephone Encounter (Signed)
 Copied from CRM #[id number]. Topic: Care Management - Discuss Care Plan  >> Apr 18, 2024  3:59 PM Reyes RAMAN wrote:      Erskin Norleen Mace contacted the Communication Center requesting to speak with the care team of [name] to discuss:    Patient states pharmacist called, they can't send his medications until his copay is paid. [phone] option 5    Please contact Mr. Kratochvil at [phone].    Thank you,   Reyes DELENA Acre  [facility] Communication [facility]   [phone]

## 2024-04-23 NOTE — Progress Notes (Signed)
 [name] has been contacted in regards to their refill of Xtandi . At this time, they have declined refill due to patient is unable to afford $4.90 copay.  He will give us  a call back to schedule a delivery once he is able to obtain payment. . Refill assessment call date has been updated per the patient's request.    Darriel Utter, PharmD  [facility]

## 2024-04-24 MED FILL — XTANDI 40 MG TABLET: ORAL | 30 days supply | Qty: 60 | Fill #1

## 2024-04-24 NOTE — Progress Notes (Signed)
 [facility] Delivery Pharm[facility] Refill Coordination Note    Specialty Medication(s) to be Shipped:   Hematology/Oncology: Xtandi     Other medication(s) to be shipped: triamcinolone  0.1 % ointment (KENALOG )    Specialty Medications not needed at this time: N/A     Calvin Obrien, DOB: 08/18/1950  Phone: [phone] (home)       All above HIPAA information was verified with patient.     Was a nurse, learning disability used for this call? No    Completed refill call assessment today to schedule patient's medication shipment from the [facility]  [phone]).  All relevant notes have been reviewed.     Specialty medication(s) and dose(s) confirmed: Regimen is correct and unchanged.   Changes to medications: [name] reports no changes at this time.  Changes to insurance: No  New side effects reported not previously addressed with a pharmacist or physician: None reported  Questions for the pharmacist: No    Confirmed patient received a Conservation Officer, Historic Buildings and a Surveyor, Mining with first shipment. The patient will receive a drug information handout for each medication shipped and additional FDA Medication Guides as required.       DISEASE/MEDICATION-SPECIFIC INFORMATION        N/A    SPECIALTY MEDICATION ADHERENCE     Medication Adherence    Patient reported X missed doses in the last month: 0  Specialty Medication: Xtandi  40mg   Patient is on additional specialty medications: No  Informant: patient              Were doses missed due to medication being on hold? No    XTANDI  40 mg tablet (enzalutamide ): 2 days of medicine on hand       Specialty medication is an injection or given on a cycle: No    REFERRAL TO PHARMACIST     Referral to the pharmacist: Not needed      [facility]     Shipping address confirmed in Epic.     Cost and Payment: Patient has a copay of $5.60. They are aware and have authorized the pharmacy to charge the credit card on file.    Delivery Scheduled: Yes, Expected medication delivery date: 01/23.     Medication will be delivered via UPS to the prescription address in Epic WAM.    Gissele Narducci   [facility]  Specialty Technician

## 2024-05-03 MED ORDER — XARELTO 2.5 MG TABLET
ORAL_TABLET | Freq: Two times a day (BID) | ORAL | 11 refills | 0.00000 days
Start: 2024-05-03 — End: ?

## 2024-05-05 MED ORDER — XARELTO 2.5 MG TABLET
ORAL_TABLET | Freq: Two times a day (BID) | ORAL | 11 refills | 30.00000 days | Status: CP
Start: 2024-05-05 — End: ?
